# Patient Record
Sex: Female | Born: 1965 | Race: White | Hispanic: No | Smoking: Never smoker
Health system: Southern US, Community
[De-identification: ages and names within clinical notes are randomized; demographics above are authoritative.]

## PROBLEM LIST (undated history)

## (undated) DIAGNOSIS — M25471 Effusion, right ankle: Secondary | ICD-10-CM

## (undated) DIAGNOSIS — M1990 Unspecified osteoarthritis, unspecified site: Secondary | ICD-10-CM

## (undated) DIAGNOSIS — E538 Deficiency of other specified B group vitamins: Secondary | ICD-10-CM

## (undated) DIAGNOSIS — Z8489 Family history of other specified conditions: Secondary | ICD-10-CM

## (undated) DIAGNOSIS — R7303 Prediabetes: Secondary | ICD-10-CM

## (undated) DIAGNOSIS — G4730 Sleep apnea, unspecified: Secondary | ICD-10-CM

## (undated) DIAGNOSIS — M2550 Pain in unspecified joint: Secondary | ICD-10-CM

## (undated) DIAGNOSIS — E559 Vitamin D deficiency, unspecified: Secondary | ICD-10-CM

## (undated) DIAGNOSIS — E079 Disorder of thyroid, unspecified: Secondary | ICD-10-CM

## (undated) DIAGNOSIS — N200 Calculus of kidney: Secondary | ICD-10-CM

## (undated) DIAGNOSIS — M25569 Pain in unspecified knee: Secondary | ICD-10-CM

## (undated) DIAGNOSIS — M25472 Effusion, left ankle: Secondary | ICD-10-CM

## (undated) DIAGNOSIS — M25474 Effusion, right foot: Secondary | ICD-10-CM

## (undated) DIAGNOSIS — I10 Essential (primary) hypertension: Secondary | ICD-10-CM

## (undated) DIAGNOSIS — Z87442 Personal history of urinary calculi: Secondary | ICD-10-CM

## (undated) DIAGNOSIS — R5383 Other fatigue: Secondary | ICD-10-CM

## (undated) DIAGNOSIS — E039 Hypothyroidism, unspecified: Secondary | ICD-10-CM

## (undated) DIAGNOSIS — T8859XA Other complications of anesthesia, initial encounter: Secondary | ICD-10-CM

## (undated) DIAGNOSIS — M25475 Effusion, left foot: Secondary | ICD-10-CM

## (undated) HISTORY — DX: Pain in unspecified knee: M25.569

## (undated) HISTORY — DX: Vitamin D deficiency, unspecified: E55.9

## (undated) HISTORY — DX: Other fatigue: R53.83

## (undated) HISTORY — PX: KIDNEY STONE SURGERY: SHX686

## (undated) HISTORY — DX: Pain in unspecified joint: M25.50

## (undated) HISTORY — DX: Effusion, right foot: M25.474

## (undated) HISTORY — PX: SEPTOPLASTY: SUR1290

## (undated) HISTORY — DX: Deficiency of other specified B group vitamins: E53.8

## (undated) HISTORY — DX: Calculus of kidney: N20.0

## (undated) HISTORY — PX: OTHER SURGICAL HISTORY: SHX169

## (undated) HISTORY — DX: Unspecified osteoarthritis, unspecified site: M19.90

## (undated) HISTORY — PX: JOINT REPLACEMENT: SHX530

## (undated) HISTORY — DX: Disorder of thyroid, unspecified: E07.9

## (undated) HISTORY — DX: Essential (primary) hypertension: I10

## (undated) HISTORY — DX: Prediabetes: R73.03

## (undated) HISTORY — DX: Sleep apnea, unspecified: G47.30

## (undated) HISTORY — DX: Effusion, left foot: M25.475

## (undated) HISTORY — DX: Effusion, left ankle: M25.472

## (undated) HISTORY — DX: Effusion, right ankle: M25.471

---

## 2014-01-06 DIAGNOSIS — M1990 Unspecified osteoarthritis, unspecified site: Secondary | ICD-10-CM | POA: Insufficient documentation

## 2014-09-20 DIAGNOSIS — R7303 Prediabetes: Secondary | ICD-10-CM | POA: Insufficient documentation

## 2014-09-24 DIAGNOSIS — E559 Vitamin D deficiency, unspecified: Secondary | ICD-10-CM | POA: Insufficient documentation

## 2015-07-20 DIAGNOSIS — G4733 Obstructive sleep apnea (adult) (pediatric): Secondary | ICD-10-CM | POA: Insufficient documentation

## 2015-07-28 DIAGNOSIS — Q8909 Congenital malformations of spleen: Secondary | ICD-10-CM | POA: Insufficient documentation

## 2019-03-01 DIAGNOSIS — N951 Menopausal and female climacteric states: Secondary | ICD-10-CM | POA: Diagnosis not present

## 2019-03-01 DIAGNOSIS — R7989 Other specified abnormal findings of blood chemistry: Secondary | ICD-10-CM | POA: Diagnosis not present

## 2019-03-01 DIAGNOSIS — E039 Hypothyroidism, unspecified: Secondary | ICD-10-CM | POA: Diagnosis not present

## 2019-03-01 DIAGNOSIS — E559 Vitamin D deficiency, unspecified: Secondary | ICD-10-CM | POA: Diagnosis not present

## 2019-03-01 DIAGNOSIS — E538 Deficiency of other specified B group vitamins: Secondary | ICD-10-CM | POA: Diagnosis not present

## 2019-03-01 DIAGNOSIS — R5383 Other fatigue: Secondary | ICD-10-CM | POA: Diagnosis not present

## 2019-03-15 DIAGNOSIS — N951 Menopausal and female climacteric states: Secondary | ICD-10-CM | POA: Diagnosis not present

## 2019-03-15 DIAGNOSIS — E8881 Metabolic syndrome: Secondary | ICD-10-CM | POA: Diagnosis not present

## 2019-03-15 DIAGNOSIS — E669 Obesity, unspecified: Secondary | ICD-10-CM | POA: Diagnosis not present

## 2019-03-15 DIAGNOSIS — E039 Hypothyroidism, unspecified: Secondary | ICD-10-CM | POA: Diagnosis not present

## 2019-05-11 DIAGNOSIS — N200 Calculus of kidney: Secondary | ICD-10-CM | POA: Diagnosis not present

## 2019-05-28 DIAGNOSIS — F419 Anxiety disorder, unspecified: Secondary | ICD-10-CM | POA: Diagnosis not present

## 2019-06-07 DIAGNOSIS — N951 Menopausal and female climacteric states: Secondary | ICD-10-CM | POA: Diagnosis not present

## 2019-06-07 DIAGNOSIS — E039 Hypothyroidism, unspecified: Secondary | ICD-10-CM | POA: Diagnosis not present

## 2019-06-07 DIAGNOSIS — R5383 Other fatigue: Secondary | ICD-10-CM | POA: Diagnosis not present

## 2019-06-07 DIAGNOSIS — E538 Deficiency of other specified B group vitamins: Secondary | ICD-10-CM | POA: Diagnosis not present

## 2019-06-07 DIAGNOSIS — E559 Vitamin D deficiency, unspecified: Secondary | ICD-10-CM | POA: Diagnosis not present

## 2019-06-07 DIAGNOSIS — E669 Obesity, unspecified: Secondary | ICD-10-CM | POA: Diagnosis not present

## 2019-06-21 DIAGNOSIS — E669 Obesity, unspecified: Secondary | ICD-10-CM | POA: Diagnosis not present

## 2019-06-21 DIAGNOSIS — E8881 Metabolic syndrome: Secondary | ICD-10-CM | POA: Diagnosis not present

## 2019-06-21 DIAGNOSIS — N951 Menopausal and female climacteric states: Secondary | ICD-10-CM | POA: Diagnosis not present

## 2019-06-21 DIAGNOSIS — E538 Deficiency of other specified B group vitamins: Secondary | ICD-10-CM | POA: Diagnosis not present

## 2019-07-16 DIAGNOSIS — J301 Allergic rhinitis due to pollen: Secondary | ICD-10-CM | POA: Diagnosis not present

## 2019-07-16 DIAGNOSIS — R9431 Abnormal electrocardiogram [ECG] [EKG]: Secondary | ICD-10-CM | POA: Diagnosis not present

## 2019-07-16 DIAGNOSIS — Z9119 Patient's noncompliance with other medical treatment and regimen: Secondary | ICD-10-CM | POA: Diagnosis not present

## 2019-07-16 DIAGNOSIS — R918 Other nonspecific abnormal finding of lung field: Secondary | ICD-10-CM | POA: Diagnosis not present

## 2019-07-16 DIAGNOSIS — R079 Chest pain, unspecified: Secondary | ICD-10-CM | POA: Diagnosis not present

## 2019-07-16 DIAGNOSIS — M5412 Radiculopathy, cervical region: Secondary | ICD-10-CM | POA: Diagnosis not present

## 2019-07-16 DIAGNOSIS — M1990 Unspecified osteoarthritis, unspecified site: Secondary | ICD-10-CM | POA: Diagnosis not present

## 2019-07-16 DIAGNOSIS — Z79899 Other long term (current) drug therapy: Secondary | ICD-10-CM | POA: Diagnosis not present

## 2019-07-16 DIAGNOSIS — R110 Nausea: Secondary | ICD-10-CM | POA: Diagnosis not present

## 2019-07-16 DIAGNOSIS — M5031 Other cervical disc degeneration,  high cervical region: Secondary | ICD-10-CM | POA: Diagnosis not present

## 2019-07-16 DIAGNOSIS — M47812 Spondylosis without myelopathy or radiculopathy, cervical region: Secondary | ICD-10-CM | POA: Diagnosis not present

## 2019-07-16 DIAGNOSIS — G4730 Sleep apnea, unspecified: Secondary | ICD-10-CM | POA: Diagnosis not present

## 2019-07-16 DIAGNOSIS — I10 Essential (primary) hypertension: Secondary | ICD-10-CM | POA: Diagnosis not present

## 2019-07-16 DIAGNOSIS — Z79891 Long term (current) use of opiate analgesic: Secondary | ICD-10-CM | POA: Diagnosis not present

## 2019-09-30 DIAGNOSIS — E039 Hypothyroidism, unspecified: Secondary | ICD-10-CM | POA: Diagnosis not present

## 2019-09-30 DIAGNOSIS — N951 Menopausal and female climacteric states: Secondary | ICD-10-CM | POA: Diagnosis not present

## 2019-09-30 DIAGNOSIS — E559 Vitamin D deficiency, unspecified: Secondary | ICD-10-CM | POA: Diagnosis not present

## 2019-09-30 DIAGNOSIS — R5383 Other fatigue: Secondary | ICD-10-CM | POA: Diagnosis not present

## 2019-09-30 DIAGNOSIS — E669 Obesity, unspecified: Secondary | ICD-10-CM | POA: Diagnosis not present

## 2019-09-30 DIAGNOSIS — E538 Deficiency of other specified B group vitamins: Secondary | ICD-10-CM | POA: Diagnosis not present

## 2019-10-18 DIAGNOSIS — N951 Menopausal and female climacteric states: Secondary | ICD-10-CM | POA: Diagnosis not present

## 2019-10-18 DIAGNOSIS — E8881 Metabolic syndrome: Secondary | ICD-10-CM | POA: Diagnosis not present

## 2019-10-18 DIAGNOSIS — E669 Obesity, unspecified: Secondary | ICD-10-CM | POA: Diagnosis not present

## 2019-10-18 DIAGNOSIS — R5383 Other fatigue: Secondary | ICD-10-CM | POA: Diagnosis not present

## 2019-11-09 DIAGNOSIS — Z1231 Encounter for screening mammogram for malignant neoplasm of breast: Secondary | ICD-10-CM | POA: Diagnosis not present

## 2019-11-26 DIAGNOSIS — N200 Calculus of kidney: Secondary | ICD-10-CM | POA: Diagnosis not present

## 2019-12-03 DIAGNOSIS — K219 Gastro-esophageal reflux disease without esophagitis: Secondary | ICD-10-CM | POA: Diagnosis not present

## 2019-12-17 DIAGNOSIS — M25562 Pain in left knee: Secondary | ICD-10-CM | POA: Diagnosis not present

## 2019-12-17 DIAGNOSIS — M25561 Pain in right knee: Secondary | ICD-10-CM | POA: Diagnosis not present

## 2020-01-10 DIAGNOSIS — M25561 Pain in right knee: Secondary | ICD-10-CM | POA: Diagnosis not present

## 2020-01-10 DIAGNOSIS — M25562 Pain in left knee: Secondary | ICD-10-CM | POA: Diagnosis not present

## 2020-01-24 DIAGNOSIS — M25561 Pain in right knee: Secondary | ICD-10-CM | POA: Diagnosis not present

## 2020-01-24 DIAGNOSIS — M170 Bilateral primary osteoarthritis of knee: Secondary | ICD-10-CM | POA: Diagnosis not present

## 2020-01-24 DIAGNOSIS — M25562 Pain in left knee: Secondary | ICD-10-CM | POA: Diagnosis not present

## 2020-02-03 DIAGNOSIS — E538 Deficiency of other specified B group vitamins: Secondary | ICD-10-CM | POA: Diagnosis not present

## 2020-02-03 DIAGNOSIS — E039 Hypothyroidism, unspecified: Secondary | ICD-10-CM | POA: Diagnosis not present

## 2020-02-03 DIAGNOSIS — N951 Menopausal and female climacteric states: Secondary | ICD-10-CM | POA: Diagnosis not present

## 2020-02-03 DIAGNOSIS — E669 Obesity, unspecified: Secondary | ICD-10-CM | POA: Diagnosis not present

## 2020-02-03 DIAGNOSIS — R5383 Other fatigue: Secondary | ICD-10-CM | POA: Diagnosis not present

## 2020-02-03 DIAGNOSIS — E559 Vitamin D deficiency, unspecified: Secondary | ICD-10-CM | POA: Diagnosis not present

## 2020-02-08 DIAGNOSIS — Z23 Encounter for immunization: Secondary | ICD-10-CM | POA: Diagnosis not present

## 2020-02-14 DIAGNOSIS — N951 Menopausal and female climacteric states: Secondary | ICD-10-CM | POA: Diagnosis not present

## 2020-02-14 DIAGNOSIS — R5383 Other fatigue: Secondary | ICD-10-CM | POA: Diagnosis not present

## 2020-02-14 DIAGNOSIS — E8881 Metabolic syndrome: Secondary | ICD-10-CM | POA: Diagnosis not present

## 2020-02-14 DIAGNOSIS — E039 Hypothyroidism, unspecified: Secondary | ICD-10-CM | POA: Diagnosis not present

## 2020-02-21 DIAGNOSIS — M25562 Pain in left knee: Secondary | ICD-10-CM | POA: Diagnosis not present

## 2020-02-21 DIAGNOSIS — M25561 Pain in right knee: Secondary | ICD-10-CM | POA: Diagnosis not present

## 2020-02-29 DIAGNOSIS — Z23 Encounter for immunization: Secondary | ICD-10-CM | POA: Diagnosis not present

## 2020-03-24 DIAGNOSIS — M25562 Pain in left knee: Secondary | ICD-10-CM | POA: Diagnosis not present

## 2020-03-24 DIAGNOSIS — M25561 Pain in right knee: Secondary | ICD-10-CM | POA: Diagnosis not present

## 2020-07-13 DIAGNOSIS — M549 Dorsalgia, unspecified: Secondary | ICD-10-CM | POA: Diagnosis not present

## 2020-07-13 DIAGNOSIS — M47812 Spondylosis without myelopathy or radiculopathy, cervical region: Secondary | ICD-10-CM | POA: Diagnosis not present

## 2020-07-13 DIAGNOSIS — M542 Cervicalgia: Secondary | ICD-10-CM | POA: Diagnosis not present

## 2020-07-24 DIAGNOSIS — M47812 Spondylosis without myelopathy or radiculopathy, cervical region: Secondary | ICD-10-CM | POA: Diagnosis not present

## 2020-07-24 DIAGNOSIS — R6889 Other general symptoms and signs: Secondary | ICD-10-CM | POA: Insufficient documentation

## 2020-07-24 DIAGNOSIS — M542 Cervicalgia: Secondary | ICD-10-CM | POA: Diagnosis not present

## 2020-08-01 DIAGNOSIS — M542 Cervicalgia: Secondary | ICD-10-CM | POA: Diagnosis not present

## 2020-08-01 DIAGNOSIS — M47812 Spondylosis without myelopathy or radiculopathy, cervical region: Secondary | ICD-10-CM | POA: Diagnosis not present

## 2020-08-01 DIAGNOSIS — R6889 Other general symptoms and signs: Secondary | ICD-10-CM | POA: Diagnosis not present

## 2020-08-04 DIAGNOSIS — E559 Vitamin D deficiency, unspecified: Secondary | ICD-10-CM | POA: Diagnosis not present

## 2020-08-04 DIAGNOSIS — E039 Hypothyroidism, unspecified: Secondary | ICD-10-CM | POA: Diagnosis not present

## 2020-08-04 DIAGNOSIS — Z13828 Encounter for screening for other musculoskeletal disorder: Secondary | ICD-10-CM | POA: Diagnosis not present

## 2020-08-04 DIAGNOSIS — N951 Menopausal and female climacteric states: Secondary | ICD-10-CM | POA: Diagnosis not present

## 2020-08-04 DIAGNOSIS — E8881 Metabolic syndrome: Secondary | ICD-10-CM | POA: Diagnosis not present

## 2020-08-04 DIAGNOSIS — E538 Deficiency of other specified B group vitamins: Secondary | ICD-10-CM | POA: Diagnosis not present

## 2020-08-04 DIAGNOSIS — R5383 Other fatigue: Secondary | ICD-10-CM | POA: Diagnosis not present

## 2020-08-04 DIAGNOSIS — E669 Obesity, unspecified: Secondary | ICD-10-CM | POA: Diagnosis not present

## 2020-08-08 DIAGNOSIS — R6889 Other general symptoms and signs: Secondary | ICD-10-CM | POA: Diagnosis not present

## 2020-08-08 DIAGNOSIS — M542 Cervicalgia: Secondary | ICD-10-CM | POA: Diagnosis not present

## 2020-08-15 DIAGNOSIS — M542 Cervicalgia: Secondary | ICD-10-CM | POA: Diagnosis not present

## 2020-08-15 DIAGNOSIS — M47812 Spondylosis without myelopathy or radiculopathy, cervical region: Secondary | ICD-10-CM | POA: Diagnosis not present

## 2020-08-15 DIAGNOSIS — R6889 Other general symptoms and signs: Secondary | ICD-10-CM | POA: Diagnosis not present

## 2020-08-17 DIAGNOSIS — M47812 Spondylosis without myelopathy or radiculopathy, cervical region: Secondary | ICD-10-CM | POA: Diagnosis not present

## 2020-08-17 DIAGNOSIS — M549 Dorsalgia, unspecified: Secondary | ICD-10-CM | POA: Diagnosis not present

## 2020-08-23 DIAGNOSIS — E669 Obesity, unspecified: Secondary | ICD-10-CM | POA: Diagnosis not present

## 2020-08-23 DIAGNOSIS — R5383 Other fatigue: Secondary | ICD-10-CM | POA: Diagnosis not present

## 2020-08-23 DIAGNOSIS — E039 Hypothyroidism, unspecified: Secondary | ICD-10-CM | POA: Diagnosis not present

## 2020-08-23 DIAGNOSIS — S83209A Unspecified tear of unspecified meniscus, current injury, unspecified knee, initial encounter: Secondary | ICD-10-CM | POA: Diagnosis not present

## 2020-09-05 DIAGNOSIS — R6889 Other general symptoms and signs: Secondary | ICD-10-CM | POA: Diagnosis not present

## 2020-09-05 DIAGNOSIS — M542 Cervicalgia: Secondary | ICD-10-CM | POA: Diagnosis not present

## 2020-09-05 DIAGNOSIS — M47812 Spondylosis without myelopathy or radiculopathy, cervical region: Secondary | ICD-10-CM | POA: Diagnosis not present

## 2020-09-07 DIAGNOSIS — Z23 Encounter for immunization: Secondary | ICD-10-CM | POA: Diagnosis not present

## 2020-10-01 ENCOUNTER — Other Ambulatory Visit: Payer: Self-pay

## 2020-10-01 ENCOUNTER — Emergency Department (INDEPENDENT_AMBULATORY_CARE_PROVIDER_SITE_OTHER)
Admission: EM | Admit: 2020-10-01 | Discharge: 2020-10-01 | Disposition: A | Discharge status: Home / Self Care | Payer: BC Managed Care – PPO | Source: Home / Self Care

## 2020-10-01 ENCOUNTER — Emergency Department (INDEPENDENT_AMBULATORY_CARE_PROVIDER_SITE_OTHER): Payer: BC Managed Care – PPO

## 2020-10-01 DIAGNOSIS — R059 Cough, unspecified: Secondary | ICD-10-CM | POA: Diagnosis not present

## 2020-10-01 DIAGNOSIS — R911 Solitary pulmonary nodule: Secondary | ICD-10-CM

## 2020-10-01 DIAGNOSIS — J208 Acute bronchitis due to other specified organisms: Secondary | ICD-10-CM

## 2020-10-01 MED ORDER — AMOXICILLIN 875 MG PO TABS: 875.0000 mg | ORAL_TABLET | Freq: Two times a day (BID) | ORAL | 0 refills | Status: DC

## 2020-10-01 MED ORDER — BENZONATATE 100 MG PO CAPS
100.0000 mg | ORAL_CAPSULE | Freq: Three times a day (TID) | ORAL | 0 refills | Status: DC | PRN
Start: 2020-10-01 — End: 2020-12-27

## 2020-10-01 MED ORDER — PREDNISONE 20 MG PO TABS: 40.0000 mg | ORAL_TABLET | Freq: Every day | ORAL | 0 refills | Status: DC

## 2020-10-01 MED ORDER — HYDROCOD POLST-CPM POLST ER 10-8 MG/5ML PO SUER: 5.0000 mL | Freq: Two times a day (BID) | ORAL | 0 refills | Status: DC | PRN

## 2020-10-01 NOTE — ED Triage Notes (Signed)
Patient presents to Urgent Care with complaints of congestion, productive cough, and shortness of breath since yesterday. Patient reports she gets bronchitis frequently. Pt has been vaccinated for covid and flu, has not been tested for covid recently.

## 2020-10-01 NOTE — ED Provider Notes (Signed)
[name]    CSN: [id number] Arrival date & time: 10/01/20  1131      History   Chief Complaint Chief Complaint  Patient presents with  . Shortness of Breath  . Nasal Congestion    HPI Laura Knox is a 54 y.o. female.   HPI Patient presents today with acute onset SOB with cough and nasal congestion. Reports history of recurrent pneumonia, bronchitis, and rhinitis type symptoms. She is not under the care of lung specialist. She is a nonsmoker. Attributes symptoms to weather changes  History reviewed. No pertinent past medical history.  There are no problems to display for this patient.   History reviewed. No pertinent surgical history.  OB History   No obstetric history on file.      Home Medications    Prior to Admission medications   Medication Sig Start Date End Date Taking? Authorizing Provider  progesterone (ENDOMETRIN) 100 MG vaginal insert Place 100 mg vaginally 2 (two) times daily.   Yes [provider]  thyroid (ARMOUR) 15 MG tablet Take 15 mg by mouth daily.   Yes [provider]    Family History Family History  Problem Relation Age of Onset  . Healthy Mother   . Cancer Father     Social History Social History   Tobacco Use  . Smoking status: Never Smoker  . Smokeless tobacco: Never Used  Vaping Use  . Vaping Use: Never used  Substance Use Topics  . Alcohol use: Not Currently  . Drug use: Never     Allergies   Patient has no known allergies.   Review of Systems Review of Systems Pertinent negatives listed in HPI    Physical Exam Triage Vital Signs ED Triage Vitals  Enc Vitals Group     BP 10/01/20 1203 (!) 179/128     Pulse Rate 10/01/20 1203 84     Resp 10/01/20 1300 16     Temp 10/01/20 1203 97.8 F (36.6 C)     Temp Source 10/01/20 1203 Oral     SpO2 10/01/20 1203 96 %     Weight --      Height --      Head Circumference --      Peak Flow --      Pain Score 10/01/20 1200 0     Pain  Loc --      Pain Edu? --      Excl. in GC? --    No data found.  Updated Vital Signs BP (S) (!) 179/128 (BP Location: Right Wrist)   Pulse 84   Temp 97.8 F (36.6 C) (Oral)   Resp 16   SpO2 96%   Visual Acuity Right Eye Distance:   Left Eye Distance:   Bilateral Distance:    Right Eye Near:   Left Eye Near:    Bilateral Near:     Physical Exam General appearance: alert, Ill-appearing, obese, no distress Head: Normocephalic, without obvious abnormality, atraumatic ENT: mucosal edema, congestion, erythematous oropharynx w/o exudate Respiratory: Respirations even , unlabored, coarse lung sound, expiratory wheeze Heart: rate and rhythm normal. No gallop or murmurs noted on exam  Abdomen: BS +, no distention, no rebound tenderness, or no mass Extremities: No gross deformities Skin: Skin color, texture, turgor normal. No rashes seen  Psych: Appropriate mood and affect. Neurologic:Alert, oriented to person, place, and time, thought content appropriate. UC Treatments / Results  Labs (all labs ordered are listed, but only abnormal results are displayed)  Labs Reviewed - No data to display  EKG   Radiology DG Chest 2 View  Result Date: 10/01/2020 CLINICAL DATA:  Cough. EXAM: CHEST - 2 VIEW COMPARISON:  None. FINDINGS: The cardio pericardial silhouette is enlarged. Interstitial markings are diffusely coarsened with chronic features. Small pulmonary nodule identified left upper lobe. The visualized bony structures of the thorax show no acute abnormality. IMPRESSION: Left upper lobe pulmonary nodule. CT chest without contrast recommended to further evaluate. Electronically Signed   By: Kennith Center M.D.   On: 10/01/2020 13:20    Procedures Procedures (including critical care time)  Medications Ordered in UC Medications - No data to display  Initial Impression / Assessment and Plan / UC Course  I have reviewed the triage vital signs and the nursing notes.  Pertinent labs &  imaging results that were available during my care of the patient were reviewed by me and considered in my medical decision making (see chart for details).    Acute bronchitis, imaging revealed incidental fining of a left upper lung nodule . Treatment per discharge medications Amoxicillin ordered as a future fill if patient symptoms do not improve within 5-7 days. Counseled patent on the indication of antibiotics and that treatment with antbx are not useful in viral or chronic respiratory illness. Referral placed [facility] pulmonology for work-up of lung nodule. Final Clinical Impressions(s) / UC Diagnoses   Final diagnoses:  Nodule of upper lobe of left lung  Acute bronchitis due to other specified organisms   Discharge Instructions   None    ED Prescriptions    Medication Sig Dispense Auth. Provider   amoxicillin (AMOXIL) 875 MG tablet Take 1 tablet (875 mg total) by mouth 2 (two) times daily. 20 tablet Bing Neighbors, FNP   chlorpheniramine-HYDROcodone [facility] ER) 10-8 MG/5ML SUER Take 5 mLs by mouth every 12 (twelve) hours as needed for cough. 100 mL Bing Neighbors, FNP   predniSONE (DELTASONE) 20 MG tablet Take 2 tablets (40 mg total) by mouth daily with breakfast. 10 tablet Bing Neighbors, FNP   benzonatate (TESSALON) 100 MG capsule Take 1-2 capsules (100-200 mg total) by mouth 3 (three) times daily as needed for cough. 40 capsule Bing Neighbors, FNP     PDMP not reviewed this encounter.   Bing Neighbors, FNP 10/05/20 2219

## 2020-10-04 ENCOUNTER — Telehealth: Payer: Self-pay | Admitting: Emergency Medicine

## 2020-10-06 ENCOUNTER — Emergency Department (INDEPENDENT_AMBULATORY_CARE_PROVIDER_SITE_OTHER)
Admission: EM | Admit: 2020-10-06 | Discharge: 2020-10-06 | Disposition: A | Discharge status: Home / Self Care | Payer: BC Managed Care – PPO | Source: Home / Self Care

## 2020-10-06 DIAGNOSIS — R030 Elevated blood-pressure reading, without diagnosis of hypertension: Secondary | ICD-10-CM | POA: Diagnosis not present

## 2020-10-06 MED ORDER — AMOXICILLIN 875 MG PO TABS: 875.0000 mg | ORAL_TABLET | Freq: Two times a day (BID) | ORAL | 0 refills | Status: DC

## 2020-10-06 NOTE — ED Triage Notes (Signed)
Patient presents to Urgent Care with complaints of continued shortness of breath and facial pressure. Patient reports she was seen over the weekend and has returned because her symptoms have not improved, states she has not slept in days, completed her prescribed course of prednisone (has order for amoxicillin to begin tomorrow). Pt denies fevers, NAD during triage, speaking in full sentences.

## 2020-10-06 NOTE — ED Provider Notes (Signed)
[name]    CSN: [id number] Arrival date & time: 10/06/20  0809      History   Chief Complaint Chief Complaint  Patient presents with  . Shortness of Breath    HPI Laura Knox is a 54 y.o. female.   HPI Here for follow up Prior note reviewed Is not due to pick up her amoxicillin until tomorrow but wants I today Feels " no better"   Still with constant cough No fever or chills Producing yellow sputum  History reviewed. No pertinent past medical history.  There are no problems to display for this patient.   History reviewed. No pertinent surgical history.  OB History   No obstetric history on file.      Home Medications    Prior to Admission medications   Medication Sig Start Date End Date Taking? Authorizing Provider  amoxicillin (AMOXIL) 875 MG tablet Take 1 tablet (875 mg total) by mouth 2 (two) times daily. 10/07/20   Eustace Moore, MD  benzonatate (TESSALON) 100 MG capsule Take 1-2 capsules (100-200 mg total) by mouth 3 (three) times daily as needed for cough. 10/01/20   Bing Neighbors, FNP  chlorpheniramine-HYDROcodone (TUSSIONEX PENNKINETIC ER) 10-8 MG/5ML SUER Take 5 mLs by mouth every 12 (twelve) hours as needed for cough. 10/01/20   Bing Neighbors, FNP  predniSONE (DELTASONE) 20 MG tablet Take 2 tablets (40 mg total) by mouth daily with breakfast. 10/01/20   Bing Neighbors, FNP  progesterone (ENDOMETRIN) 100 MG vaginal insert Place 100 mg vaginally 2 (two) times daily.    [provider]  thyroid (ARMOUR) 15 MG tablet Take 15 mg by mouth daily.    [provider]    Family History Family History  Problem Relation Age of Onset  . Healthy Mother   . Cancer Father     Social History Social History   Tobacco Use  . Smoking status: Never Smoker  . Smokeless tobacco: Never Used  Vaping Use  . Vaping Use: Never used  Substance Use Topics  . Alcohol use: Not Currently  . Drug use: Never      Allergies   Patient has no known allergies.   Review of Systems Review of Systems See HPI  Physical Exam Triage Vital Signs ED Triage Vitals  Enc Vitals Group     BP 10/06/20 0826 (!) 185/113     Pulse Rate 10/06/20 0826 82     Resp 10/06/20 0826 16     Temp 10/06/20 0826 (!) 97.5 F (36.4 C)     Temp Source 10/06/20 0826 Oral     SpO2 10/06/20 0826 95 %     Weight --      Height --      Head Circumference --      Peak Flow --      Pain Score 10/06/20 0825 5     Pain Loc --      Pain Edu? --      Excl. in GC? --    No data found.  Updated Vital Signs BP (S) (!) 185/113 (BP Location: Right Wrist)   Pulse 82   Temp (!) 97.5 F (36.4 C) (Oral)   Resp 16   SpO2 95%     Physical Exam Constitutional:      General: She is not in acute distress.    Appearance: She is well-developed and well-nourished. She is obese. She is ill-appearing.  HENT:     Head: Normocephalic  and atraumatic.     Mouth/Throat:     Mouth: Oropharynx is clear and moist.  Eyes:     Conjunctiva/sclera: Conjunctivae normal.     Pupils: Pupils are equal, round, and reactive to light.  Cardiovascular:     Rate and Rhythm: Normal rate.     Heart sounds: Normal heart sounds.  Pulmonary:     Effort: Pulmonary effort is normal. No respiratory distress.     Breath sounds: Normal breath sounds.     Comments: Few anterior rhonchi Abdominal:     General: There is no distension.     Palpations: Abdomen is soft.  Musculoskeletal:        General: No edema. Normal range of motion.     Cervical back: Normal range of motion.  Skin:    General: Skin is warm and dry.  Neurological:     Mental Status: She is alert.  Psychiatric:        Behavior: Behavior normal.      UC Treatments / Results  Labs (all labs ordered are listed, but only abnormal results are displayed) Labs Reviewed - No data to display  EKG   Radiology No results found.  Procedures Procedures (including critical care  time)  Medications Ordered in UC Medications - No data to display  Initial Impression / Assessment and Plan / UC Course  I have reviewed the triage vital signs and the nursing notes.  Pertinent labs & imaging results that were available during my care of the patient were reviewed by me and considered in my medical decision making (see chart for details).     Reviewed elevated BP Push fluids Return as needed   Final Clinical Impressions(s) / UC Diagnoses   Final diagnoses:  Elevated blood pressure reading     Discharge Instructions     Continue to drink plenty of water Consider a humidifier Take the antibiotic 2 x a day Follow up on your blood pressure/ Need a primary care doctor   ED Prescriptions    Medication Sig Dispense Auth. Provider   amoxicillin (AMOXIL) 875 MG tablet Take 1 tablet (875 mg total) by mouth 2 (two) times daily. 20 tablet Eustace Moore, MD     PDMP not reviewed this encounter.   Eustace Moore, MD 10/06/20 1027

## 2020-10-06 NOTE — Discharge Instructions (Signed)
Continue to drink plenty of water Consider a humidifier Take the antibiotic 2 x a day Follow up on your blood pressure/ Need a primary care doctor

## 2020-12-01 DIAGNOSIS — H6983 Other specified disorders of Eustachian tube, bilateral: Secondary | ICD-10-CM | POA: Diagnosis not present

## 2020-12-01 DIAGNOSIS — H93293 Other abnormal auditory perceptions, bilateral: Secondary | ICD-10-CM | POA: Diagnosis not present

## 2020-12-05 DIAGNOSIS — N200 Calculus of kidney: Secondary | ICD-10-CM | POA: Diagnosis not present

## 2020-12-20 ENCOUNTER — Other Ambulatory Visit: Payer: Self-pay

## 2020-12-20 ENCOUNTER — Encounter: Payer: Self-pay | Admitting: Gastroenterology

## 2020-12-20 ENCOUNTER — Encounter: Payer: Self-pay | Admitting: Medical

## 2020-12-20 ENCOUNTER — Ambulatory Visit (INDEPENDENT_AMBULATORY_CARE_PROVIDER_SITE_OTHER): Payer: BC Managed Care – PPO | Admitting: Medical

## 2020-12-20 VITALS — BP 157/78 | HR 76 | Temp 98.3°F | Resp 20 | Ht 67.0 in | Wt 382.0 lb

## 2020-12-20 DIAGNOSIS — E039 Hypothyroidism, unspecified: Secondary | ICD-10-CM | POA: Diagnosis not present

## 2020-12-20 DIAGNOSIS — Z1159 Encounter for screening for other viral diseases: Secondary | ICD-10-CM | POA: Diagnosis not present

## 2020-12-20 DIAGNOSIS — Z113 Encounter for screening for infections with a predominantly sexual mode of transmission: Secondary | ICD-10-CM

## 2020-12-20 DIAGNOSIS — G4730 Sleep apnea, unspecified: Secondary | ICD-10-CM | POA: Diagnosis not present

## 2020-12-20 DIAGNOSIS — I10 Essential (primary) hypertension: Secondary | ICD-10-CM | POA: Diagnosis not present

## 2020-12-20 DIAGNOSIS — Z1211 Encounter for screening for malignant neoplasm of colon: Secondary | ICD-10-CM | POA: Diagnosis not present

## 2020-12-20 DIAGNOSIS — Z124 Encounter for screening for malignant neoplasm of cervix: Secondary | ICD-10-CM

## 2020-12-20 DIAGNOSIS — Z6841 Body Mass Index (BMI) 40.0 and over, adult: Secondary | ICD-10-CM

## 2020-12-20 LAB — COMPREHENSIVE METABOLIC PANEL
ALT: 16 U/L (ref 0–35)
AST: 12 U/L (ref 0–37)
Albumin: 3.5 g/dL (ref 3.5–5.2)
Alkaline Phosphatase: 100 U/L (ref 39–117)
BUN: 21 mg/dL (ref 6–23)
CO2: 32 mEq/L (ref 19–32)
Calcium: 8.9 mg/dL (ref 8.4–10.5)
Chloride: 104 mEq/L (ref 96–112)
Creatinine, Ser: 0.75 mg/dL (ref 0.40–1.20)
GFR: 89.43 mL/min (ref >60.00)
Glucose, Bld: 95 mg/dL (ref 70–99)
Potassium: 4.3 mEq/L (ref 3.5–5.1)
Sodium: 141 mEq/L (ref 135–145)
Total Bilirubin: 0.4 mg/dL (ref 0.2–1.2)
Total Protein: 6.4 g/dL (ref 6.0–8.3)

## 2020-12-20 LAB — T4, FREE: Free T4: 0.82 ng/dL (ref 0.60–1.60)

## 2020-12-20 LAB — TSH: TSH: 0.75 u[IU]/mL (ref 0.35–4.50)

## 2020-12-20 MED ORDER — LOSARTAN POTASSIUM 50 MG PO TABS: 50.0000 mg | ORAL_TABLET | Freq: Every day | ORAL | 0 refills | Status: DC

## 2020-12-20 NOTE — Progress Notes (Signed)
Subjective:    Patient ID: Laura Knox, female    DOB: August 05, 1966, 55 y.o.   MRN: [id number]  HPI  Pt in for first time.  Pt moved from [place] in 2004.   Pt was seeing [place]. Pt does exericise 6 days a week light aerobics and swims twice a week. No alcohol use. Non smoker.  Pt has been struggling to lose weight. Pt states lost 45 lb with effort over 6 months then plateued.   Pt states recent high blood pressures. Pt states in December when congested and not sleeping her bp was elevated(bp was 185/113 in ED). Pt has not been checking her bp at home. No history of high cholesterol.   Pt states she has sleep apnea. Dx 10 years ago. Not on cpap.  Hx of low thyroid by chart review. Pt was on armour thyroid under [facility]. She is not seeing them anymore.  Pt states last spring had 6 months of reflux symptoms. She took omeprazole and retired. Symptoms resolved.  Pt knows she needs screening colonoscopy. Pt states up to date on mammogram. Last one was normal.   Pt is overdue for pap. Placed referral today.  Pt decline referral to pulmonologist.    Review of Systems  Constitutional: Negative for chills, fatigue and fever.  HENT: Negative for congestion, drooling, nosebleeds and postnasal drip.   Respiratory: Negative for cough, chest tightness, shortness of breath and wheezing.   Cardiovascular: Negative for chest pain and palpitations.  Gastrointestinal: Negative for abdominal pain.  Genitourinary: Negative for dysuria, enuresis and flank pain.  Musculoskeletal: Negative for back pain and myalgias.  Skin: Negative for rash and wound.  Neurological: Negative for dizziness, speech difficulty, weakness, numbness and headaches.  Hematological: Negative for adenopathy. Does not bruise/bleed easily.  Psychiatric/Behavioral: Negative for behavioral problems and confusion.    No past medical history on file.   Social History   Socioeconomic History  . Marital  status: Married    Spouse name: Not on file  . Number of children: Not on file  . Years of education: Not on file  . Highest education level: Not on file  Occupational History  . Not on file  Tobacco Use  . Smoking status: Never Smoker  . Smokeless tobacco: Never Used  Vaping Use  . Vaping Use: Never used  Substance and Sexual Activity  . Alcohol use: Not Currently  . Drug use: Never  . Sexual activity: Not on file  Other Topics Concern  . Not on file  Social History Narrative  . Not on file   Social Determinants of Health   Financial Resource Strain: Not on file  Food Insecurity: Not on file  Transportation Needs: Not on file  Physical Activity: Not on file  Stress: Not on file  Social Connections: Not on file  Intimate Partner Violence: Not on file    No past surgical history on file.  Family History  Problem Relation Age of Onset  . Healthy Mother   . Cancer Father     No Known Allergies  Current Outpatient Medications on File Prior to Visit  Medication Sig Dispense Refill  . estradiol (VIVELLE-DOT) 0.05 MG/24HR patch     . omeprazole (PRILOSEC) 20 MG capsule Take by mouth.    . progesterone (PROMETRIUM) 100 MG capsule Take by mouth.    . pyridOXINE (VITAMIN B-6) 25 MG tablet Take by mouth.    . sucralfate (CARAFATE) 1 g tablet Take by mouth.    Marland Kitchen  amoxicillin (AMOXIL) 875 MG tablet Take 1 tablet (875 mg total) by mouth 2 (two) times daily. (Patient not taking: Reported on 12/20/2020) 20 tablet 0  . benzonatate (TESSALON) 100 MG capsule Take 1-2 capsules (100-200 mg total) by mouth 3 (three) times daily as needed for cough. (Patient not taking: Reported on 12/20/2020) 40 capsule 0  . cetirizine (ZYRTEC) 5 MG tablet Take by mouth.    . chlorpheniramine-HYDROcodone (TUSSIONEX PENNKINETIC ER) 10-8 MG/5ML SUER Take 5 mLs by mouth every 12 (twelve) hours as needed for cough. (Patient not taking: Reported on 12/20/2020) 100 mL 0  . Naproxen Sodium 220 MG CAPS Take 1  tablet by mouth 2 (two) times daily.    . predniSONE (DELTASONE) 20 MG tablet Take 2 tablets (40 mg total) by mouth daily with breakfast. (Patient not taking: Reported on 12/20/2020) 10 tablet 0  . progesterone (ENDOMETRIN) 100 MG vaginal insert Place 100 mg vaginally 2 (two) times daily. (Patient not taking: Reported on 12/20/2020)    . thyroid (ARMOUR) 15 MG tablet Take 15 mg by mouth daily. (Patient not taking: Reported on 12/20/2020)    . VITAMIN D, CHOLECALCIFEROL, PO Take by mouth.     No current facility-administered medications on file prior to visit.    BP (!) 157/78   Pulse 76   Temp 98.3 F (36.8 C) (Oral)   Resp 20   Ht 5\' 7"  (1.702 m)   Wt (!) 382 lb (173.3 kg)   SpO2 96%   BMI 59.83 kg/m       Objective:   Physical Exam  General Mental Status- Alert. General Appearance- Not in acute distress.   Skin General: Color- Normal Color. Moisture- Normal Moisture.  Neck Carotid Arteries- Normal color. Moisture- Normal Moisture. No carotid bruits. No JVD.  Chest and Lung Exam Auscultation: Breath Sounds:-Normal.  Cardiovascular Auscultation:Rythm- Regular. Murmurs & Other Heart Sounds:Auscultation of the heart reveals- No Murmurs.  Abdomen Inspection:-Inspeection Normal. Palpation/Percussion:Note:No mass. Palpation and Percussion of the abdomen reveal- Non Tender, Non Distended + BS, no rebound or guarding.    Neurologic Cranial Nerve exam:- CN III-XII intact(No nystagmus), symmetric smile. Drift Test:- No drift. Romberg Exam:- Negative.  Heal to Toe Gait exam:-Normal. Finger to Nose:- Normal/Intact Strength:- 5/5 equal and symmetric strength both upper and lower extremities.      Assessment & Plan:  Nice to meet you today.  You do have history of recent hypertension moderate to severe since December.  I do think best to go ahead and start you on losartan 50 mg dose.  Today's blood pressure more reasonable and on ED visit quite worrisome.  I want you to get  electronic blood pressure cuff over-the-counter and start checking blood pressures daily until follow-up visit.  Get metabolic panel today.  History of hypothyroidism.  Will get TSH and T4 today.  Continue Armour Thyroid.  We will see if the need dose change after labs.  History of obesity and sleep apnea.  You declined referral to specialist for CPAP.  I do think is a good idea to go ahead and refer you for weight loss management.  With weight loss your sleep apnea might improve.  Referral placed for screening colonoscopy and Pap smear.  Hep C antibody and HIV blood work today for screening purposes.  Follow-up in 2 to 3 weeks or as needed.  January, PA-C

## 2020-12-20 NOTE — Patient Instructions (Addendum)
Nice to meet you today.  You do have history of recent hypertension moderate to severe since December.  I do think best to go ahead and start you on losartan 50 mg dose.  Today's blood pressure more reasonable and on ED visit quite worrisome.  I want you to get electronic blood pressure cuff over-the-counter and start checking blood pressures daily until follow-up visit.  Get metabolic panel today.  History of hypothyroidism.  Will get TSH and T4 today.  Continue Armour Thyroid.  We will see if the need dose change after labs.  History of obesity and sleep apnea.  You declined referral to specialist for CPAP.  I do think is a good idea to go ahead and refer you for weight loss management.  With weight loss your sleep apnea might improve.  Referral placed for screening colonoscopy and Pap smear.  Hep C antibody and HIV blood work today for screening purposes.  Follow-up in 2 to 3 weeks or as needed.  Pt later called stating she expected pulmonologist referral. So will place.

## 2020-12-21 LAB — HEPATITIS C ANTIBODY
Hepatitis C Ab: NONREACTIVE
SIGNAL TO CUT-OFF: 0 (ref <1.00)

## 2020-12-21 LAB — HIV ANTIBODY (ROUTINE TESTING W REFLEX): HIV 1&2 Ab, 4th Generation: NONREACTIVE

## 2020-12-22 ENCOUNTER — Telehealth: Payer: Self-pay | Admitting: Medical

## 2020-12-22 NOTE — Telephone Encounter (Signed)
Patient states on her visit this week she was suppose to have a referral to  Pulmonary . No referral is listed. Please place referral so we can authorize. Thanks

## 2020-12-22 NOTE — Addendum Note (Signed)
Addended by: Gwenevere Abbot on: 12/22/2020 11:22 AM   Modules accepted: Orders

## 2020-12-22 NOTE — Telephone Encounter (Signed)
Pt declined referral during visit. But did recommend. I placed referral just now.ca

## 2020-12-25 ENCOUNTER — Telehealth: Payer: Self-pay

## 2020-12-25 ENCOUNTER — Telehealth: Payer: Self-pay | Admitting: Medical

## 2020-12-25 NOTE — Telephone Encounter (Signed)
Called pt and lvm to return call.  

## 2020-12-25 NOTE — Telephone Encounter (Signed)
Patient states Losartan is on backorder and needs another script and doesn't know when it will be available.   Pharmacy : [facility] Correct pharmacy in chart

## 2020-12-25 NOTE — Telephone Encounter (Signed)
I do want to prescribe her losartan rather than switching to other class of med or other ARB. Is she close to other pharmacy. Or is she willing to get losartan at [facility] but other [facility] have in stock when I ask. Seems for whatever reason losartan not available at [facility]?

## 2020-12-25 NOTE — Telephone Encounter (Signed)
Opened to review 

## 2020-12-26 MED ORDER — LOSARTAN POTASSIUM 50 MG PO TABS: 50.0000 mg | ORAL_TABLET | Freq: Every day | ORAL | 0 refills | Status: DC

## 2020-12-26 NOTE — Telephone Encounter (Signed)
[facility] in [place]   [address], [place], [place] [address]

## 2020-12-26 NOTE — Addendum Note (Signed)
Addended by: Gwenevere Abbot on: 12/26/2020 05:21 PM   Modules accepted: Orders

## 2020-12-26 NOTE — Telephone Encounter (Signed)
Rx losartan sent to [facility] [place].

## 2020-12-26 NOTE — Telephone Encounter (Signed)
Called pt and lvm to return call.  

## 2020-12-27 ENCOUNTER — Encounter: Payer: Self-pay | Admitting: Medical

## 2020-12-27 ENCOUNTER — Other Ambulatory Visit: Payer: Self-pay

## 2020-12-27 ENCOUNTER — Encounter (INDEPENDENT_AMBULATORY_CARE_PROVIDER_SITE_OTHER): Payer: Self-pay | Admitting: Family Medicine

## 2020-12-27 ENCOUNTER — Ambulatory Visit (INDEPENDENT_AMBULATORY_CARE_PROVIDER_SITE_OTHER): Payer: BC Managed Care – PPO | Admitting: Family Medicine

## 2020-12-27 VITALS — BP 144/87 | HR 78 | Temp 98.4°F | Ht 66.0 in | Wt 378.0 lb

## 2020-12-27 DIAGNOSIS — G4730 Sleep apnea, unspecified: Secondary | ICD-10-CM | POA: Diagnosis not present

## 2020-12-27 DIAGNOSIS — E538 Deficiency of other specified B group vitamins: Secondary | ICD-10-CM | POA: Diagnosis not present

## 2020-12-27 DIAGNOSIS — E559 Vitamin D deficiency, unspecified: Secondary | ICD-10-CM | POA: Diagnosis not present

## 2020-12-27 DIAGNOSIS — Z9189 Other specified personal risk factors, not elsewhere classified: Secondary | ICD-10-CM

## 2020-12-27 DIAGNOSIS — R5383 Other fatigue: Secondary | ICD-10-CM

## 2020-12-27 DIAGNOSIS — Z1331 Encounter for screening for depression: Secondary | ICD-10-CM | POA: Diagnosis not present

## 2020-12-27 DIAGNOSIS — I10 Essential (primary) hypertension: Secondary | ICD-10-CM

## 2020-12-27 DIAGNOSIS — Z0289 Encounter for other administrative examinations: Secondary | ICD-10-CM

## 2020-12-27 DIAGNOSIS — R0602 Shortness of breath: Secondary | ICD-10-CM

## 2020-12-27 DIAGNOSIS — Z6841 Body Mass Index (BMI) 40.0 and over, adult: Secondary | ICD-10-CM

## 2020-12-27 NOTE — Progress Notes (Signed)
Office: [phone]  /  Fax: [phone]    Date: January 01, 2021   Appointment Start Time: 3:14pm Duration: 43 minutes Provider: Lawerance Cruel, Psy.D. Type of Session: Intake for Individual Therapy  Location of Patient: Home Location of Provider: Provider's Home (private office) Type of Contact: Telepsychological Visit via MyChart Video Visit & Telephone call   Informed Consent: This provider called Brexley at 3:02pm as she did not present for the telepsychological appointment. Assistance on connecting was provided. This provider called Elycia a few additional times to assist with connection. Due to ongoing issues with MyChart, today's appointment proceeded via telephone with Shaquia's verbal consent. As such, today's appointment was initiated 14 minutes late. Prior to proceeding with today's appointment, two pieces of identifying information were obtained. In addition, Ranisha's physical location at the time of this appointment was obtained as well a phone number she could be reached at in the event of technical difficulties. Amiyah and this provider participated in today's telepsychological service.   The provider's role was explained to [employer] The provider reviewed and discussed issues of confidentiality, privacy, and limits therein (e.g., reporting obligations). In addition to verbal informed consent, written informed consent for psychological services was obtained prior to the initial appointment. Since the clinic is not a 24/7 crisis center, mental health emergency resources were shared and this  provider explained MyChart, e-mail, voicemail, and/or other messaging systems should be utilized only for non-emergency reasons. This provider also explained that information obtained during appointments will be placed in Shaquna's medical record and relevant information will be shared with other providers at [facility] for coordination of care. Emelda agreed information may be shared with  other [facility] providers as needed for coordination of care and by signing the service agreement document, she provided written consent for coordination of care. Prior to initiating telepsychological services, Tiearra completed an informed consent document, which included the development of a safety plan (i.e., an emergency contact and emergency resources) in the event of an emergency/crisis. Marisel expressed understanding of the rationale of the safety plan. Barney verbally acknowledged understanding she is ultimately responsible for understanding her insurance benefits for telepsychological and in-person services. This provider also reviewed confidentiality, as it relates to telepsychological services, as well as the rationale for telepsychological services (i.e., to reduce exposure risk to COVID-19). Constancia  acknowledged understanding that appointments cannot be recorded without both party consent and she is aware she is responsible for securing confidentiality on her end of the session. Takako verbally consented to proceed.  Chief Complaint/HPI: Camrie was referred by Dr. Quillian Quince on December 27, 2020 (her initial appointment with the clinic). The note for the initial appointment with Dr. Quillian Quince indicated the following: "Urijah's habits were reviewed today and are as follows: Her family eats meals together, she thinks her family will eat healthier with her, her desired weight loss is 178 lbs, she has been heavy most of her life, she started gaining weight in college, her heaviest weight ever was >400 pounds, she has significant food cravings issues, she is frequently drinking liquids with calories, she frequently makes poor food choices, she has problems with excessive hunger, she frequently eats larger portions than normal and she struggles with emotional eating." Bernestine's Food and Mood (modified PHQ-9) score on December 27, 2020 was 20.  During today's appointment, Jo shared she feels the  prescribed meal plan is "limiting;" however, she is "on plan more than not." She indicated she is "struggling with  cravings." She was verbally administered a questionnaire assessing various behaviors related to emotional eating. Charnae endorsed the following: overeat when you are celebrating, experience food cravings on a regular basis, find food is comforting to you, overeat when you are alone, but eat much less when you are with other people and eat as a reward. She described the current frequency of emotional eating as daily. In addition, Treazure denied a history of binge eating. Demisha denied a history of restricting food intake, purging and engagement in other compensatory strategies for weight loss, and has never been diagnosed with an eating disorder. She also denied a history of treatment for emotional eating. Moreover, Vicke believes her eating habits are secondary to her upbringing, adding she enjoys cooking. She also discussed experiencing pain in her knees, but shared she is working out 5-6 days a week.   Mental Status Examination:  Appearance: unable to assess  Behavior: unable to assess Mood: euthymic Affect: unable to fully assess Speech: normal in rate, volume, and tone Eye Contact: unable to assess Psychomotor Activity: unable to assess  Gait: unable to assess Thought Process: linear, logical, and goal directed  Thought Content/Perception: denies suicidal and homicidal ideation, plan, and intent and no hallucinations, delusions, bizarre thinking or behavior reported or observed Orientation: time, person, place, and purpose of appointment Memory/Concentration: memory, attention, language, and fund of knowledge intact  Insight/Judgment: good  Family & Psychosocial History: Shya reported she is married and she does not have any children. She indicated she is currently retired. Additionally, Shamaria shared her highest level of education obtained is a bachelor's degree. Currently, Ambry's  social support system consists of her husband, mother, and friends. Moreover, Bonita stated she resides with her husband.   Medical History:  Past Medical History:  Diagnosis Date  . B12 deficiency   . Bilateral swelling of feet and ankles   . Hypertension   . Joint pain   . Kidney stones   . Knee pain   . Osteoarthritis   . Other fatigue   . Prediabetes   . Sleep apnea   . Thyroid disease   . Vitamin D deficiency    Past Surgical History:  Procedure Laterality Date  . KIDNEY STONE SURGERY    . miniscus repair Left   . SEPTOPLASTY     Current Outpatient Medications on File Prior to Visit  Medication Sig Dispense Refill  . Cyanocobalamin (B-12) 1000 MCG TABS Take 1 tablet by mouth daily.    Marland Kitchen estradiol (VIVELLE-DOT) 0.05 MG/24HR patch Place 1 patch onto the skin 2 (two) times a week.    . losartan (COZAAR) 50 MG tablet Take 1 tablet (50 mg total) by mouth daily. (Patient not taking: Reported on 12/27/2020) 30 tablet 0  . Naproxen Sodium 220 MG CAPS Take 3 tablets by mouth 2 (two) times daily.    . progesterone (PROMETRIUM) 100 MG capsule Take by mouth.    . pyridOXINE (VITAMIN B-6) 25 MG tablet Take by mouth.    . thyroid (ARMOUR) 120 MG tablet Take 120 mg by mouth daily before breakfast.    . VITAMIN D, CHOLECALCIFEROL, PO Take 5,000 Units by mouth daily.     No current facility-administered medications on file prior to visit.  Rebbecca denied a history of head injuries and loss of consciousness.    Mental Health History: Demetrica reported she attended therapeutic services "off and on" during different points in her life for situational concerns, noting the last time was early 2021 due to  work stress. Albin FellingCarla reported there is no history of hospitalizations for psychiatric concerns. Albin FellingCarla denied a family history of mental health/substance abuse related concerns. Albin FellingCarla reported there is no history of trauma including psychological, physical  and sexual abuse, as well as neglect.   Albin FellingCarla  described her typical mood lately as "good, happy." She described feeling angry when she has to tell herself not to eat certain foods. Albin FellingCarla denied current alcohol use. She denied tobacco use. She denied illicit/recreational substance use. She denied caffeine intake. Furthermore, Albin FellingCarla indicated she is not experiencing the following: hallucinations and delusions, paranoia, symptoms of mania , social withdrawal, crying spells, panic attacks and decreased motivation. She also denied history of and current suicidal ideation, plan, and intent; history of and current homicidal ideation, plan, and intent; and history of and current engagement in self-harm.  Legal History: Albin FellingCarla reported there is no history of legal involvement.   Structured Assessments Results: The Patient Health Questionnaire-9 (PHQ-9) is a self-report measure that assesses symptoms and severity of depression over the course of the last two weeks. Albin FellingCarla obtained a score of 5 suggesting mild depression. Albin FellingCarla finds the endorsed symptoms to be somewhat difficult. [0= Not at all; 1= Several days; 2= More than half the days; 3= Nearly every day] Little interest or pleasure in doing things 0  Feeling down, depressed, or hopeless 0  Trouble falling or staying asleep, or sleeping too much 0  Feeling tired or having little energy 1  Poor appetite or overeating 1  Feeling bad about yourself --- or that you are a failure or have let yourself or your family down 3  Trouble concentrating on things, such as reading the newspaper or watching television 0  Moving or speaking so slowly that other people could have noticed? Or the opposite --- being so fidgety or restless that you have been moving around a lot more than usual 0  Thoughts that you would be better off dead or hurting yourself in some way 0  PHQ-9 Score 5    The Generalized Anxiety Disorder-7 (GAD-7) is a brief self-report measure that assesses symptoms of anxiety over the course of the  last two weeks. Albin FellingCarla obtained a score of 1 suggesting minimal anxiety. Albin FellingCarla finds the endorsed symptoms to be somewhat difficult. [0= Not at all; 1= Several days; 2= Over half the days; 3= Nearly every day] Feeling nervous, anxious, on edge 1  Not being able to stop or control worrying 0  Worrying too much about different things 0  Trouble relaxing 0  Being so restless that it's hard to sit still 0  Becoming easily annoyed or irritable 0  Feeling afraid as if something awful might happen 0  GAD-7 Score 1   Interventions:  Conducted a chart review Focused on rapport building Verbally administered PHQ-9 and GAD-7 for symptom monitoring Verbally administered Food & Mood questionnaire to assess various behaviors related to emotional eating Provided emphatic reflections and validation Collaborated with patient on a treatment goal  Psychoeducation provided regarding physical versus emotional hunger  Psychoeducation regarding anger as a secondary emotion  Provisional DSM-5 Diagnosis(es): 311 (F32.8) Other Specified Depressive Disorder, Emotional Eating Behaviors  Plan: Albin FellingCarla appears able and willing to participate as evidenced by collaboration on a treatment goal, engagement in reciprocal conversation, and asking questions as needed for clarification. The next appointment will be scheduled in two weeks, which will be via MyChart Video Visit. The following treatment goal was established: increase coping skills. This provider will regularly  review the treatment plan and medical chart to keep informed of status changes. Honesti expressed understanding and agreement with the initial treatment plan of care. Dawnette will be sent a handout via e-mail to utilize between now and the next appointment to increase awareness of hunger patterns and subsequent eating. Pecolia provided verbal consent during today's appointment for this provider to send the handout via e-mail.

## 2020-12-28 LAB — HEMOGLOBIN A1C
Est. average glucose Bld gHb Est-mCnc: 126 mg/dL
Hgb A1c MFr Bld: 6 % — ABNORMAL HIGH (ref 4.8–5.6)

## 2020-12-28 LAB — CBC WITH DIFFERENTIAL/PLATELET
Basophils Absolute: 0 10*3/uL (ref 0.0–0.2)
Basos: 1 %
EOS (ABSOLUTE): 0.2 10*3/uL (ref 0.0–0.4)
Eos: 2 %
Hematocrit: 41.2 % (ref 34.0–46.6)
Hemoglobin: 13.7 g/dL (ref 11.1–15.9)
Immature Grans (Abs): 0 10*3/uL (ref 0.0–0.1)
Immature Granulocytes: 1 %
Lymphocytes Absolute: 1.4 10*3/uL (ref 0.7–3.1)
Lymphs: 17 %
MCH: 28.9 pg (ref 26.6–33.0)
MCHC: 33.3 g/dL (ref 31.5–35.7)
MCV: 87 fL (ref 79–97)
Monocytes Absolute: 0.6 10*3/uL (ref 0.1–0.9)
Monocytes: 8 %
Neutrophils Absolute: 5.9 10*3/uL (ref 1.4–7.0)
Neutrophils: 71 %
Platelets: 305 10*3/uL (ref 150–450)
RBC: 4.74 x10E6/uL (ref 3.77–5.28)
RDW: 13.2 % (ref 11.7–15.4)
WBC: 8.1 10*3/uL (ref 3.4–10.8)

## 2020-12-28 LAB — COMPREHENSIVE METABOLIC PANEL
ALT: 23 IU/L (ref 0–32)
AST: 18 IU/L (ref 0–40)
Albumin/Globulin Ratio: 1.5 (ref 1.2–2.2)
Albumin: 4 g/dL (ref 3.8–4.9)
Alkaline Phosphatase: 109 IU/L (ref 44–121)
BUN/Creatinine Ratio: 25 — ABNORMAL HIGH (ref 9–23)
BUN: 17 mg/dL (ref 6–24)
Bilirubin Total: 0.4 mg/dL (ref 0.0–1.2)
CO2: 18 mmol/L — ABNORMAL LOW (ref 20–29)
Calcium: 8.9 mg/dL (ref 8.7–10.2)
Chloride: 108 mmol/L — ABNORMAL HIGH (ref 96–106)
Creatinine, Ser: 0.69 mg/dL (ref 0.57–1.00)
Globulin, Total: 2.7 g/dL (ref 1.5–4.5)
Glucose: 110 mg/dL — ABNORMAL HIGH (ref 65–99)
Potassium: 4.5 mmol/L (ref 3.5–5.2)
Sodium: 145 mmol/L — ABNORMAL HIGH (ref 134–144)
Total Protein: 6.7 g/dL (ref 6.0–8.5)
eGFR: 102 mL/min/{1.73_m2} (ref >59)

## 2020-12-28 LAB — LIPID PANEL WITH LDL/HDL RATIO
Cholesterol, Total: 141 mg/dL (ref 100–199)
HDL: 44 mg/dL (ref >39)
LDL Chol Calc (NIH): 81 mg/dL (ref 0–99)
LDL/HDL Ratio: 1.8 ratio (ref 0.0–3.2)
Triglycerides: 80 mg/dL (ref 0–149)
VLDL Cholesterol Cal: 16 mg/dL (ref 5–40)

## 2020-12-28 LAB — FOLATE: Folate: 6.1 ng/mL (ref >3.0)

## 2020-12-28 LAB — VITAMIN D 25 HYDROXY (VIT D DEFICIENCY, FRACTURES): Vit D, 25-Hydroxy: 55.2 ng/mL (ref 30.0–100.0)

## 2020-12-28 LAB — INSULIN, RANDOM: INSULIN: 20.4 u[IU]/mL (ref 2.6–24.9)

## 2020-12-28 LAB — VITAMIN B12: Vitamin B-12: 801 pg/mL (ref 232–1245)

## 2020-12-28 NOTE — Progress Notes (Signed)
Chief Complaint:   OBESITY Laura Knox (MR# [id number]) is a 55 y.o. female who presents for evaluation and treatment of obesity and related comorbidities. Current BMI is Body mass index is 61.01 kg/m. Laura Knox has been struggling with her weight for many years and has been unsuccessful in either losing weight, maintaining weight loss, or reaching her healthy weight goal.  Laura Knox is followed by a Holistic provider, who manages her hormones and thyroid.  Laura Knox is currently in the action stage of change and ready to dedicate time achieving and maintaining a healthier weight. Laura Knox is interested in becoming our patient and working on intensive lifestyle modifications including (but not limited to) diet and exercise for weight loss.  Laura Knox's habits were reviewed today and are as follows: Her family eats meals together, she thinks her family will eat healthier with her, her desired weight loss is 178 lbs, she has been heavy most of her life, she started gaining weight in college, her heaviest weight ever was >400 pounds, she has significant food cravings issues, she is frequently drinking liquids with calories, she frequently makes poor food choices, she has problems with excessive hunger, she frequently eats larger portions than normal and she struggles with emotional eating.  Depression Screen Laura Knox Food and Mood (modified PHQ-9) score was 20.  Depression screen [facility] 2/9 12/27/2020  Decreased Interest 3  Down, Depressed, Hopeless 2  PHQ - 2 Score 5  Altered sleeping 3  Tired, decreased energy 3  Change in appetite 3  Feeling bad or failure about yourself  3  Trouble concentrating 0  Moving slowly or fidgety/restless 3  Suicidal thoughts 0  PHQ-9 Score 20  Difficult doing work/chores Not difficult at all   Subjective:   1. Other fatigue Laura Knox admits to daytime somnolence and admits to waking up still tired. Patent has a history of symptoms of daytime fatigue and morning headache.  Laura Knox generally gets 6 or 7 hours of sleep per night, and states that she has difficulty falling asleep. Snoring is present. Apneic episodes are present. Epworth Sleepiness Score is 3.  2. Sleep apnea, unspecified type Laura Knox is unable to tolerate CPAP, and she is working on diet and weight loss.  3. Vitamin D deficiency Laura Knox has no recent Vit D labs done, and she notes fatigue.  4. B12 deficiency Laura Knox is on multivitamins, and she is due to have her B12 checked. She denies a history of pernicious anemia or gastric surgery.  5. Essential hypertension Laura Knox recently was prescribed losartan, but she hasn't started it yet.  6. At risk for heart disease Laura Knox is at a higher than average risk for cardiovascular disease due to obesity.   Assessment/Plan:   1. Other fatigue Laura Knox does feel that her weight is causing her energy to be lower than it should be. Fatigue may be related to obesity, depression or many other causes. Labs will be ordered, and in the meanwhile, Laura Knox will focus on self care including making healthy food choices, increasing physical activity and focusing on stress reduction.  - CBC with Differential/Platelet - EKG 12-Lead - Folate - Hemoglobin A1c - Insulin, random - Lipid Panel With LDL/HDL Ratio - Comprehensive metabolic panel  2. Sleep apnea, unspecified type Intensive lifestyle modifications are the first line treatment for this issue. We discussed several lifestyle modifications today. IC was done today. Laura Knox will work on diet, exercise and weight loss efforts. We will continue to monitor. Orders and follow up as documented in patient  record.   3. Vitamin D deficiency Low Vitamin D level contributes to fatigue and are associated with obesity, breast, and colon cancer. We will check labs today. Laura Knox will follow-up for routine testing of Vitamin D, at least 2-3 times per year to avoid over-replacement.  - VITAMIN D 25 Hydroxy (Vit-D Deficiency, Fractures)  4.  B12 deficiency The diagnosis was reviewed with the patient. Counseling provided today, see below. We will check labs today, and will continue to monitor. Orders and follow up as documented in patient record.  - Vitamin B12  5. Essential hypertension Laura Knox will start her medications as soon as possible, and will start her Category 4 plan. She will continue working on healthy weight loss and exercise to improve blood pressure control. We will watch for signs of hypotension as she continues her lifestyle modifications.  6. Screening for depression Laura Knox had a positive depression screening. Depression is commonly associated with obesity and often results in emotional eating behaviors. We will monitor this closely and work on CBT to help improve the non-hunger eating patterns. Referral to Psychology may be required if no improvement is seen as she continues in our clinic.  7. At risk for heart disease Laura Knox was given approximately 30 minutes of coronary artery disease prevention counseling today. She is 55 y.o. female and has risk factors for heart disease including obesity. We discussed intensive lifestyle modifications today with an emphasis on specific weight loss instructions and strategies.   Repetitive spaced learning was employed today to elicit superior memory formation and behavioral change.  8. Class 3 severe obesity with serious comorbidity and body mass index (BMI) of 60.0 to 69.9 in adult, unspecified obesity type (HCC) Laura Knox is currently in the action stage of change and her goal is to continue with weight loss efforts. I recommend Laura Knox begin the structured treatment plan as follows:  She has agreed to the Category 4 Plan.  Exercise goals: No exercise has been prescribed for now, while we concentrate on nutritional changes.  Behavioral modification strategies: increasing lean protein intake and meal planning and cooking strategies.  She was informed of the importance of frequent  follow-up visits to maximize her success with intensive lifestyle modifications for her multiple health conditions. She was informed we would discuss her lab results at her next visit unless there is a critical issue that needs to be addressed sooner. Laura Knox agreed to keep her next visit at the agreed upon time to discuss these results.  Objective:   Blood pressure (!) 144/87, pulse 78, temperature 98.4 F (36.9 C), height 5\' 6"  (1.676 m), weight (!) 378 lb (171.5 kg), SpO2 97 %. Body mass index is 61.01 kg/m.  EKG: Normal sinus rhythm, rate 80 BPM.  Indirect Calorimeter completed today shows a VO2 of 410 and a REE of 2851.  Her calculated basal metabolic rate is thus her basal metabolic rate is better than expected.  General: Cooperative, alert, well developed, in no acute distress. HEENT: Conjunctivae and lids unremarkable. Cardiovascular: Regular rhythm.  Lungs: Normal work of breathing. Neurologic: No focal deficits.   Lab Results  Component Value Date   CREATININE 0.69 12/27/2020   BUN 17 12/27/2020   NA 145 (H) 12/27/2020   K 4.5 12/27/2020   CL 108 (H) 12/27/2020   CO2 18 (L) 12/27/2020   Lab Results  Component Value Date   ALT 23 12/27/2020   AST 18 12/27/2020   ALKPHOS 109 12/27/2020   BILITOT 0.4 12/27/2020   Lab Results  Component Value Date   HGBA1C 6.0 (H) 12/27/2020   Lab Results  Component Value Date   INSULIN 20.4 12/27/2020   Lab Results  Component Value Date   TSH 0.75 12/20/2020   Lab Results  Component Value Date   CHOL 141 12/27/2020   HDL 44 12/27/2020   LDLCALC 81 12/27/2020   TRIG 80 12/27/2020   Lab Results  Component Value Date   WBC 8.1 12/27/2020   HGB 13.7 12/27/2020   HCT 41.2 12/27/2020   MCV 87 12/27/2020   PLT 305 12/27/2020   No results found for: IRON, TIBC, FERRITIN  Attestation Statements:   Reviewed by clinician on day of visit: allergies, medications, problem list, medical history, surgical history, family  history, social history, and previous encounter notes.   I, Burt Knack, am acting as transcriptionist for Quillian Quince, MD.  I have reviewed the above documentation for accuracy and completeness, and I agree with the above. - Quillian Quince, MD

## 2021-01-01 ENCOUNTER — Encounter (INDEPENDENT_AMBULATORY_CARE_PROVIDER_SITE_OTHER): Payer: Self-pay

## 2021-01-01 ENCOUNTER — Telehealth (INDEPENDENT_AMBULATORY_CARE_PROVIDER_SITE_OTHER): Payer: BC Managed Care – PPO | Admitting: Psychology

## 2021-01-01 DIAGNOSIS — F3289 Other specified depressive episodes: Secondary | ICD-10-CM | POA: Diagnosis not present

## 2021-01-01 NOTE — Progress Notes (Signed)
  Office: [phone]  /  Fax: [phone]    Date: January 15, 2021   Appointment Start Time: 2:34pm Duration: 31 minutes Provider: Lawerance Cruel, Psy.D. Type of Session: Individual Therapy  Location of Patient: Home Location of Provider: Provider's Home (private office) Type of Contact: Telepsychological Visit via MyChart Video Visit & Telephone call   Session Content: This provider called Laura Knox at 2:32pm as she did not present for the telepsychological appointment. Assistance with connecting was provided. Of note, today's appointment was switched to a regular telephone call at 2:36pm with Laura Knox's verbal consent due to technical issues on her end. However, she was able to connect via MyChart Video Visit at 2:44pm; therefore, the appointment was changed back to a MyChart Video Visit. Laura Knox is a 55 y.o. female presenting for a follow-up appointment to address the previously established treatment goal of increasing coping skills. Today's appointment was a telepsychological visit due to COVID-19. Laura Knox provided verbal consent for today's telepsychological appointment and she is aware she is responsible for securing confidentiality on her end of the session. Prior to proceeding with today's appointment, Laura Knox's physical location at the time of this appointment was obtained as well a phone number she could be reached at in the event of technical difficulties. Laura Knox and this provider participated in today's telepsychological service.   This provider conducted a brief check-in. Laura Knox shared about her first follow-up appointment with the clinic, noting she lost six pounds. Reviewed physical and emotional hunger. Psychoeducation regarding triggers for emotional eating was provided. Laura Knox was provided a handout, and encouraged to utilize the handout between now and the next appointment to increase awareness of triggers and frequency. Laura Knox agreed. This provider also discussed behavioral strategies for specific  triggers, such as placing the utensil down when conversing to avoid mindless eating. Laura Knox provided verbal consent during today's appointment for this provider to send a handout about triggers via e-mail. Laura Knox was receptive to today's appointment as evidenced by openness to sharing, responsiveness to feedback, and willingness to explore triggers for emotional eating.  Mental Status Examination:  Appearance: well groomed and appropriate hygiene  Behavior: appropriate to circumstances Mood: euthymic Affect: mood congruent Speech: normal in rate, volume, and tone Eye Contact: appropriate Psychomotor Activity: unable to assess  Gait: unable to assess Thought Process: linear, logical, and goal directed  Thought Content/Perception: no hallucinations, delusions, bizarre thinking or behavior reported or observed and no evidence of suicidal and homicidal ideation, plan, and intent Orientation: time, person, place, and purpose of appointment Memory/Concentration: memory, attention, language, and fund of knowledge intact  Insight/Judgment: good  Interventions:  Conducted a brief chart review Provided empathic reflections and validation Reviewed content from the previous session Employed supportive psychotherapy interventions to facilitate reduced distress and to improve coping skills with identified stressors Psychoeducation provided regarding triggers for emotional eating  DSM-5 Diagnosis(es): 311 (F32.8) Other Specified Depressive Disorder, Emotional Eating Behaviors  Treatment Goal & Progress: During the initial appointment with this provider, the following treatment goal was established: increase coping skills. Laura Knox has demonstrated progress in her goal as evidenced by increased awareness of hunger patterns.   Plan: Based on progress to date and Laura Knox's request, the next appointment will be scheduled in three weeks, which will be via MyChart Video Visit. The next session will focus on working  towards the established treatment goal.

## 2021-01-10 ENCOUNTER — Other Ambulatory Visit: Payer: Self-pay

## 2021-01-10 ENCOUNTER — Ambulatory Visit (INDEPENDENT_AMBULATORY_CARE_PROVIDER_SITE_OTHER): Payer: BC Managed Care – PPO | Admitting: Family Medicine

## 2021-01-10 ENCOUNTER — Telehealth: Payer: Self-pay | Admitting: *Deleted

## 2021-01-10 ENCOUNTER — Encounter (INDEPENDENT_AMBULATORY_CARE_PROVIDER_SITE_OTHER): Payer: Self-pay | Admitting: Family Medicine

## 2021-01-10 VITALS — BP 152/78 | HR 79 | Temp 98.2°F | Ht 66.0 in | Wt 372.0 lb

## 2021-01-10 DIAGNOSIS — Z9189 Other specified personal risk factors, not elsewhere classified: Secondary | ICD-10-CM | POA: Diagnosis not present

## 2021-01-10 DIAGNOSIS — Z6841 Body Mass Index (BMI) 40.0 and over, adult: Secondary | ICD-10-CM | POA: Diagnosis not present

## 2021-01-10 DIAGNOSIS — R7303 Prediabetes: Secondary | ICD-10-CM

## 2021-01-10 DIAGNOSIS — I10 Essential (primary) hypertension: Secondary | ICD-10-CM | POA: Diagnosis not present

## 2021-01-10 MED ORDER — METFORMIN HCL 500 MG PO TABS: 500.0000 mg | ORAL_TABLET | Freq: Every day | ORAL | 0 refills | Status: DC

## 2021-01-10 NOTE — Telephone Encounter (Deleted)
Dr Chales Abrahams,  This pt is a direct screen colon with you - Colon 4-18 Monday She has a BMI of 61.04-   No GI hx Do you want  her to have an OV ?  Please advise- Thanks so much, Hilda Lias PV

## 2021-01-10 NOTE — Telephone Encounter (Signed)
OV 4-20 at 850 am with Dr Chales Abrahams

## 2021-01-10 NOTE — Telephone Encounter (Signed)
error 

## 2021-01-10 NOTE — Telephone Encounter (Signed)
Pt's BMi is 61.04- she will needasn OV with Dr Chales Abrahams- I canceled her 4-1 PV, and her 5-5 colon- called pt- LM to return call as she needs to sch OV Hilda Lias PV

## 2021-01-15 ENCOUNTER — Telehealth (INDEPENDENT_AMBULATORY_CARE_PROVIDER_SITE_OTHER): Payer: BC Managed Care – PPO | Admitting: Psychology

## 2021-01-15 ENCOUNTER — Other Ambulatory Visit: Payer: Self-pay

## 2021-01-15 ENCOUNTER — Ambulatory Visit (INDEPENDENT_AMBULATORY_CARE_PROVIDER_SITE_OTHER): Payer: BC Managed Care – PPO | Admitting: Medical

## 2021-01-15 ENCOUNTER — Encounter: Payer: Self-pay | Admitting: Medical

## 2021-01-15 VITALS — BP 150/80 | HR 87 | Resp 18 | Ht 66.0 in | Wt 376.0 lb

## 2021-01-15 DIAGNOSIS — Z6841 Body Mass Index (BMI) 40.0 and over, adult: Secondary | ICD-10-CM | POA: Diagnosis not present

## 2021-01-15 DIAGNOSIS — F3289 Other specified depressive episodes: Secondary | ICD-10-CM | POA: Diagnosis not present

## 2021-01-15 DIAGNOSIS — I10 Essential (primary) hypertension: Secondary | ICD-10-CM | POA: Diagnosis not present

## 2021-01-15 MED ORDER — LOSARTAN POTASSIUM 100 MG PO TABS: 100.0000 mg | ORAL_TABLET | Freq: Every day | ORAL | 3 refills | Status: DC

## 2021-01-15 NOTE — Patient Instructions (Addendum)
Your blood pressure is improved but not controlled. Will increase your losartan to 100 mg daily.   Good job on your 6 lb weight loss. Continue with weight loss management.  With weight loss your sleep apnea might be improved with signficant wt loss.  Prediabetes and recently given metformin.  Follow up 2 months or as needed.(but 10 days update on bp level with increased losartan dose)

## 2021-01-15 NOTE — Progress Notes (Signed)
Subjective:    Patient ID: Laura Knox, female    DOB: 1966-07-28, 55 y.o.   MRN: [id number]  HPI  Pt in for follow up.  Pt bp here was high.  Pt bp ranges 140-150/80-90. Most of the time.  BP's 155/95, 148/95, 145/81, 153/99, 146/87, 151/84, 140/98, 156/89, 135/86, 154/89, 141/75, 144/88, 149/95, 139/80, 126/75, 135/74, 135/75, 153/90, 148/85, 151/74, 126/73, 144/81, 136/74, 151/74, 149/100 and  135/78.  Pt has lost 6 pounds.  Pt never been on cpap.  Last saw sleep specailist 5-6 years ago. She stats can't tolerate the equipment.   Pt has already lost 6 pounds with Weight loss management.    Review of Systems  Constitutional: Negative for appetite change, diaphoresis, fatigue and fever.  Respiratory: Negative for cough, chest tightness, shortness of breath and wheezing.   Cardiovascular: Negative for chest pain and palpitations.  Gastrointestinal: Negative for abdominal pain, constipation, nausea and vomiting.  Genitourinary: Negative for dysuria, flank pain and genital sores.  Musculoskeletal: Negative for back pain and gait problem.  Skin: Negative for rash.  Neurological: Negative for dizziness.  Hematological: Negative for adenopathy. Does not bruise/bleed easily.  Psychiatric/Behavioral: Negative for behavioral problems and confusion. The patient is not nervous/anxious.      Past Medical History:  Diagnosis Date  . B12 deficiency   . Bilateral swelling of feet and ankles   . Hypertension   . Joint pain   . Kidney stones   . Knee pain   . Osteoarthritis   . Other fatigue   . Prediabetes   . Sleep apnea   . Thyroid disease   . Vitamin D deficiency      Social History   Socioeconomic History  . Marital status: Married    Spouse name: Greig Castilla  . Number of children: 0  . Years of education: Not on file  . Highest education level: Not on file  Occupational History  . Occupation: Retired  Tobacco Use  . Smoking status: Never Smoker  . Smokeless tobacco:  Never Used  Vaping Use  . Vaping Use: Never used  Substance and Sexual Activity  . Alcohol use: Not Currently  . Drug use: Never  . Sexual activity: Yes    Partners: Male  Other Topics Concern  . Not on file  Social History Narrative  . Not on file   Social Determinants of Health   Financial Resource Strain: Not on file  Food Insecurity: Not on file  Transportation Needs: Not on file  Physical Activity: Not on file  Stress: Not on file  Social Connections: Not on file  Intimate Partner Violence: Not on file    Past Surgical History:  Procedure Laterality Date  . KIDNEY STONE SURGERY    . miniscus repair Left   . SEPTOPLASTY      Family History  Problem Relation Age of Onset  . Healthy Mother   . Obesity Mother   . Cancer Father   . Obesity Father     No Known Allergies  Current Outpatient Medications on File Prior to Visit  Medication Sig Dispense Refill  . Cyanocobalamin (B-12) 1000 MCG TABS Take 1 tablet by mouth daily.    Marland Kitchen estradiol (VIVELLE-DOT) 0.05 MG/24HR patch Place 1 patch onto the skin 2 (two) times a week.    . metFORMIN (GLUCOPHAGE) 500 MG tablet Take 1 tablet (500 mg total) by mouth daily with breakfast. 30 tablet 0  . Naproxen Sodium 220 MG CAPS Take 3 tablets by mouth 2 (  two) times daily.    . progesterone (PROMETRIUM) 100 MG capsule Take by mouth.    . pyridOXINE (VITAMIN B-6) 25 MG tablet Take by mouth.    . thyroid (ARMOUR) 120 MG tablet Take 120 mg by mouth daily before breakfast.    . VITAMIN D, CHOLECALCIFEROL, PO Take 5,000 Units by mouth daily.     No current facility-administered medications on file prior to visit.    BP (!) 150/80   Pulse 87   Resp 18   Ht 5\' 6"  (1.676 m)   Wt (!) 376 lb (170.6 kg)   SpO2 96%   BMI 60.69 kg/m       Objective:   Physical Exam  General Mental Status- Alert. General Appearance- Not in acute distress.   Skin General: Color- Normal Color. Moisture- Normal Moisture.  Neck Carotid  Arteries- Normal color. Moisture- Normal Moisture. No carotid bruits. No JVD.  Chest and Lung Exam Auscultation: Breath Sounds:-Normal.  Cardiovascular Auscultation:Rythm- Regular. Murmurs & Other Heart Sounds:Auscultation of the heart reveals- No Murmurs.  Abdomen Inspection:-Inspeection Normal. Palpation/Percussion:Note:No mass. Palpation and Percussion of the abdomen reveal- Non Tender, Non Distended + BS, no rebound or guarding.   Neurologic Cranial Nerve exam:- CN III-XII intact(No nystagmus), symmetric smile. Strength:- 5/5 equal and symmetric strength both upper and lower extremities.      Assessment & Plan:  Your blood pressure is improved but not controlled. Will increase your losartan to 100 mg daily.   Good job on your 6 lb weight loss. Continue with weight loss management.  With weight loss your sleep apnea might be improved with signficant wt loss.  Prediabetes and recently given metformin.  Follow up 2 months or as needed.

## 2021-01-16 ENCOUNTER — Encounter: Payer: Self-pay | Admitting: Pulmonary Disease

## 2021-01-16 ENCOUNTER — Ambulatory Visit (INDEPENDENT_AMBULATORY_CARE_PROVIDER_SITE_OTHER): Payer: BC Managed Care – PPO | Admitting: Pulmonary Disease

## 2021-01-16 VITALS — BP 134/86 | HR 71 | Temp 97.8°F | Ht 66.0 in | Wt 374.6 lb

## 2021-01-16 DIAGNOSIS — R9389 Abnormal findings on diagnostic imaging of other specified body structures: Secondary | ICD-10-CM

## 2021-01-16 NOTE — Progress Notes (Unsigned)
Chief Complaint:   OBESITY Laura Knox is here to discuss her progress with her obesity treatment plan along with follow-up of her obesity related diagnoses. [name] is on the Category 4 Plan and states she is following her eating plan approximately 70% of the time. [name] states she is doing Geophysicist/field seismologist and water aerobics for 60 minutes 6 times per week.  Today's visit was #: 2 Starting weight: 378 lbs Starting date: 12/27/2020 Today's weight: 372 lbs Today's date: 01/10/2021 Total lbs lost to date: 6 Total lbs lost since last in-office visit: 6  Interim History: Jaylee has done very well with weight loss on her Category 4 plan. She did well moving her food options around to fit her better. She would like more options for breakfast.  Subjective:   1. Essential hypertension Laura Knox's blood pressure is elevated today. She started losartan approximately 2 weeks ago. She has done well with diet and weight loss.  2. Pre-diabetes Laura Knox's A1c, fasting insulin, and fasting glucose are elevated. She has done well with diet and weight loss. I discussed labs with the patient today.  3. At risk for heart disease Laura Knox is at a higher than average risk for cardiovascular disease due to obesity.   Assessment/Plan:   1. Essential hypertension Laura Knox will continue diet, exercise, and healthy weight loss to improve blood pressure control. We will watch for signs of hypotension as she continues her lifestyle modifications. We will recheck her blood pressure at her next visit.  2. Pre-diabetes Laura Knox agreed to start metformin 500 mg q AM with food, with no refills. She will continue to work on weight loss, exercise, and decreasing simple carbohydrates to help decrease the risk of diabetes.   - metFORMIN (GLUCOPHAGE) 500 MG tablet; Take 1 tablet (500 mg total) by mouth daily with breakfast.  Dispense: 30 tablet; Refill: 0  3. At risk for heart disease Laura Knox was given approximately 30 minutes of coronary  artery disease prevention counseling today. She is 55 y.o. female and has risk factors for heart disease including obesity. We discussed intensive lifestyle modifications today with an emphasis on specific weight loss instructions and strategies.   Repetitive spaced learning was employed today to elicit superior memory formation and behavioral change.  4. Class 3 severe obesity with serious comorbidity and body mass index (BMI) of 60.0 to 69.9 in adult, unspecified obesity type (HCC) Laura Knox is currently in the action stage of change. As such, her goal is to continue with weight loss efforts. She has agreed to the Category 4 Plan with breakfast options.   Exercise goals: As is.  Behavioral modification strategies: increasing lean protein intake and meal planning and cooking strategies.  Laura Knox has agreed to follow-up with our clinic in 2 weeks. She was informed of the importance of frequent follow-up visits to maximize her success with intensive lifestyle modifications for her multiple health conditions.   Objective:   Blood pressure (!) 152/78, pulse 79, temperature 98.2 F (36.8 C), height 5\' 6"  (1.676 m), weight (!) 372 lb (168.7 kg), SpO2 95 %. Body mass index is 60.04 kg/m.  General: Cooperative, alert, well developed, in no acute distress. HEENT: Conjunctivae and lids unremarkable. Cardiovascular: Regular rhythm.  Lungs: Normal work of breathing. Neurologic: No focal deficits.   Lab Results  Component Value Date   CREATININE 0.69 12/27/2020   BUN 17 12/27/2020   NA 145 (H) 12/27/2020   K 4.5 12/27/2020   CL 108 (H) 12/27/2020   CO2 18 (L)  12/27/2020   Lab Results  Component Value Date   ALT 23 12/27/2020   AST 18 12/27/2020   ALKPHOS 109 12/27/2020   BILITOT 0.4 12/27/2020   Lab Results  Component Value Date   HGBA1C 6.0 (H) 12/27/2020   Lab Results  Component Value Date   INSULIN 20.4 12/27/2020   Lab Results  Component Value Date   TSH 0.75 12/20/2020   Lab  Results  Component Value Date   CHOL 141 12/27/2020   HDL 44 12/27/2020   LDLCALC 81 12/27/2020   TRIG 80 12/27/2020   Lab Results  Component Value Date   WBC 8.1 12/27/2020   HGB 13.7 12/27/2020   HCT 41.2 12/27/2020   MCV 87 12/27/2020   PLT 305 12/27/2020   No results found for: IRON, TIBC, FERRITIN  Attestation Statements:   Reviewed by clinician on day of visit: allergies, medications, problem list, medical history, surgical history, family history, social history, and previous encounter notes.   I, Burt Knack, am acting as transcriptionist for Quillian Quince, MD.  I have reviewed the above documentation for accuracy and completeness, and I agree with the above. -  Quillian Quince, MD

## 2021-01-16 NOTE — Progress Notes (Signed)
Laura Knox    [id number]    06-17-1966  Primary Care Physician:Saguier, Kateri Mc  Referring Physician: Esperanza Richters, PA-C [id number] [place],  [place] [address]  Chief complaint:   Patient with abnormal chest x-ray  HPI:  Chest x-ray shows a left upper lung nodule  Looking through her chart-said nodule has been present since a chest x-ray report of 2013 notable for a chronic left upper lobe calcified nodule  Previous CT abdomen also did reveal calcifications in the spleen  Denies any significant symptoms at present  Has had bouts with bronchitis and pneumonias in the past, feels relatively well at present  History of obstructive sleep apnea, not able to tolerate CPAP  Is working on weight loss Bariatric program  Outpatient Encounter Medications as of 01/16/2021  Medication Sig  . Cyanocobalamin (B-12) 1000 MCG TABS Take 1 tablet by mouth daily.  Marland Kitchen estradiol (VIVELLE-DOT) 0.05 MG/24HR patch Place 1 patch onto the skin 2 (two) times a week.  . losartan (COZAAR) 100 MG tablet Take 1 tablet (100 mg total) by mouth daily.  . metFORMIN (GLUCOPHAGE) 500 MG tablet Take 1 tablet (500 mg total) by mouth daily with breakfast.  . Naproxen Sodium 220 MG CAPS Take 3 tablets by mouth 2 (two) times daily.  . progesterone (PROMETRIUM) 100 MG capsule Take by mouth.  . pyridOXINE (VITAMIN B-6) 25 MG tablet Take by mouth.  . thyroid (ARMOUR) 120 MG tablet Take 120 mg by mouth daily before breakfast.  . VITAMIN D, CHOLECALCIFEROL, PO Take 5,000 Units by mouth daily.   No facility-administered encounter medications on file as of 01/16/2021.    Allergies as of 01/16/2021  . (No Known Allergies)    Past Medical History:  Diagnosis Date  . B12 deficiency   . Bilateral swelling of feet and ankles   . Hypertension   . Joint pain   . Kidney stones   . Knee pain   . Osteoarthritis   . Other fatigue   . Prediabetes   . Sleep apnea   . Thyroid disease    . Vitamin D deficiency     Past Surgical History:  Procedure Laterality Date  . KIDNEY STONE SURGERY    . miniscus repair Left   . SEPTOPLASTY      Family History  Problem Relation Age of Onset  . Healthy Mother   . Obesity Mother   . Cancer Father   . Obesity Father     Social History   Socioeconomic History  . Marital status: Married    Spouse name: Greig Castilla  . Number of children: 0  . Years of education: Not on file  . Highest education level: Not on file  Occupational History  . Occupation: Retired  Tobacco Use  . Smoking status: Never Smoker  . Smokeless tobacco: Never Used  Vaping Use  . Vaping Use: Never used  Substance and Sexual Activity  . Alcohol use: Not Currently  . Drug use: Never  . Sexual activity: Yes    Partners: Male  Other Topics Concern  . Not on file  Social History Narrative  . Not on file   Social Determinants of Health   Financial Resource Strain: Not on file  Food Insecurity: Not on file  Transportation Needs: Not on file  Physical Activity: Not on file  Stress: Not on file  Social Connections: Not on file  Intimate Partner Violence: Not on file  Review of Systems  Respiratory: Negative for cough and shortness of breath.     Vitals:   01/16/21 1352  BP: 134/86  Pulse: 71  Temp: 97.8 F (36.6 C)  SpO2: 95%     Physical Exam HENT:     Head: Normocephalic.     Mouth/Throat:     Mouth: Mucous membranes are moist.  Eyes:     General: No scleral icterus.       Right eye: No discharge.        Left eye: No discharge.  Cardiovascular:     Rate and Rhythm: Normal rate and regular rhythm.     Heart sounds: No murmur heard. No friction rub.  Pulmonary:     Effort: No respiratory distress.     Breath sounds: No stridor. No wheezing or rhonchi.  Musculoskeletal:     Cervical back: No rigidity or tenderness.  Neurological:     Mental Status: She is alert.  Psychiatric:        Mood and Affect: Mood normal.    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

## 2021-01-16 NOTE — Patient Instructions (Signed)
Chest x-ray as reviewed shows a small nodule in the left upper lobe -Has been present since 2013 -Likely related to a chronic process as evidenced by the changes in the spleen-calcified nodules  No realistic expectation of any progression as it has been stable for so long -No other intervention recommended at present  Call with significant concerns  See you as needed

## 2021-01-24 ENCOUNTER — Other Ambulatory Visit: Payer: Self-pay

## 2021-01-24 ENCOUNTER — Encounter (INDEPENDENT_AMBULATORY_CARE_PROVIDER_SITE_OTHER): Payer: Self-pay | Admitting: Adult Health

## 2021-01-24 ENCOUNTER — Ambulatory Visit (INDEPENDENT_AMBULATORY_CARE_PROVIDER_SITE_OTHER): Payer: BC Managed Care – PPO | Admitting: Adult Health

## 2021-01-24 VITALS — BP 129/76 | HR 83 | Temp 98.1°F | Ht 66.0 in | Wt 374.0 lb

## 2021-01-24 DIAGNOSIS — I10 Essential (primary) hypertension: Secondary | ICD-10-CM

## 2021-01-24 DIAGNOSIS — Z6841 Body Mass Index (BMI) 40.0 and over, adult: Secondary | ICD-10-CM

## 2021-01-24 DIAGNOSIS — Z9189 Other specified personal risk factors, not elsewhere classified: Secondary | ICD-10-CM

## 2021-01-24 DIAGNOSIS — R7303 Prediabetes: Secondary | ICD-10-CM

## 2021-01-24 MED ORDER — METFORMIN HCL 500 MG PO TABS: 500.0000 mg | ORAL_TABLET | Freq: Every day | ORAL | 0 refills | Status: DC

## 2021-01-29 DIAGNOSIS — R7303 Prediabetes: Secondary | ICD-10-CM | POA: Insufficient documentation

## 2021-01-29 DIAGNOSIS — I10 Essential (primary) hypertension: Secondary | ICD-10-CM | POA: Insufficient documentation

## 2021-01-29 NOTE — Progress Notes (Signed)
Chief Complaint:   OBESITY Laura Knox is here to discuss her progress with her obesity treatment plan along with follow-up of her obesity related diagnoses. Laura Knox is on the Category 4 Plan with breakfast options and states she is following her eating plan approximately 70% of the time. Laura Knox states she is going to the gym 60 minutes 5-6 times per week.  Today's visit was #: 3 Starting weight: 378 lbs Starting date: 12/27/2020 Today's weight: 374 lbs Today's date: 01/24/2021 Total lbs lost to date: 4 lbs Total lbs lost since last in-office visit: 0  Interim History: Laura Knox needs to achieve BMI less than 40 for bilateral knee replacement. She would also like to reduce overall prescription medications for chronic disease.  Subjective:   1. Pre-diabetes Laura Knox's 12/27/2020 BG 110, A1c 6.0, and insulin level 20.4. She reports slower bowel movement since starting Metformin 500 mg QD.  Lab Results  Component Value Date   HGBA1C 6.0 (H) 12/27/2020   Lab Results  Component Value Date   INSULIN 20.4 12/27/2020    2. Essential hypertension Laura Knox's PCP started her on losartan 50 mg and titrated up to 100 mg QD. Her BP/HR are excellent at OV today.  BP Readings from Last 3 Encounters:  01/24/21 129/76  01/16/21 134/86  01/15/21 (!) 150/80    3. At risk for diabetes mellitus Laura Knox is at higher than average risk for developing diabetes due to obesity and pre-diabetes.  Assessment/Plan:   1. Pre-diabetes Laura Knox will continue to work on weight loss, exercise, and decreasing simple carbohydrates to help decrease the risk of diabetes.   - metFORMIN (GLUCOPHAGE) 500 MG tablet; Take 1 tablet (500 mg total) by mouth daily with breakfast.  Dispense: 30 tablet; Refill: 0  2. Essential hypertension Laura Knox is working on healthy weight loss and exercise to improve blood pressure control. We will watch for signs of hypotension as she continues her lifestyle modifications. Continue losartan 100 mg and  follow up with PCP.  3. At risk for diabetes mellitus Laura Knox was given approximately 15 minutes of diabetes education and counseling today. We discussed intensive lifestyle modifications today with an emphasis on weight loss as well as increasing exercise and decreasing simple carbohydrates in her diet. We also reviewed medication options with an emphasis on risk versus benefit of those discussed.   Repetitive spaced learning was employed today to elicit superior memory formation and behavioral change.  4. Class 3 severe obesity with serious comorbidity and body mass index (BMI) of 60.0 to 69.9 in adult, unspecified obesity type (HCC) Laura Knox is currently in the action stage of change. As such, her goal is to continue with weight loss efforts. She has agreed to the Category 4 Plan with breakfast options and keeping a food journal and adhering to recommended goals of 550-700 calories and 45 g protein.   Exercise goals: As is  Behavioral modification strategies: increasing lean protein intake, meal planning and cooking strategies and planning for success.  Laura Knox has agreed to follow-up with our clinic in 2 weeks. She was informed of the importance of frequent follow-up visits to maximize her success with intensive lifestyle modifications for her multiple health conditions.   Objective:   Blood pressure 129/76, pulse 83, temperature 98.1 F (36.7 C), height 5\' 6"  (1.676 m), weight (!) 374 lb (169.6 kg), SpO2 95 %. Body mass index is 60.37 kg/m.  General: Cooperative, alert, well developed, in no acute distress. HEENT: Conjunctivae and lids unremarkable. Cardiovascular: Regular rhythm.  Lungs:  Normal work of breathing. Neurologic: No focal deficits.   Lab Results  Component Value Date   CREATININE 0.69 12/27/2020   BUN 17 12/27/2020   NA 145 (H) 12/27/2020   K 4.5 12/27/2020   CL 108 (H) 12/27/2020   CO2 18 (L) 12/27/2020   Lab Results  Component Value Date   ALT 23 12/27/2020   AST  18 12/27/2020   ALKPHOS 109 12/27/2020   BILITOT 0.4 12/27/2020   Lab Results  Component Value Date   HGBA1C 6.0 (H) 12/27/2020   Lab Results  Component Value Date   INSULIN 20.4 12/27/2020   Lab Results  Component Value Date   TSH 0.75 12/20/2020   Lab Results  Component Value Date   CHOL 141 12/27/2020   HDL 44 12/27/2020   LDLCALC 81 12/27/2020   TRIG 80 12/27/2020   Lab Results  Component Value Date   WBC 8.1 12/27/2020   HGB 13.7 12/27/2020   HCT 41.2 12/27/2020   MCV 87 12/27/2020   PLT 305 12/27/2020    Attestation Statements:   Reviewed by clinician on day of visit: allergies, medications, problem list, medical history, surgical history, family history, social history, and previous encounter notes.  Edmund Hilda, am acting as Energy manager for William Hamburger, NP.  I have reviewed the above documentation for accuracy and completeness, and I agree with the above. -  Kennieth Plotts d. Domini Vandehei, NP-C

## 2021-02-02 ENCOUNTER — Other Ambulatory Visit (INDEPENDENT_AMBULATORY_CARE_PROVIDER_SITE_OTHER): Payer: Self-pay | Admitting: Family Medicine

## 2021-02-02 DIAGNOSIS — R7303 Prediabetes: Secondary | ICD-10-CM

## 2021-02-05 ENCOUNTER — Telehealth (INDEPENDENT_AMBULATORY_CARE_PROVIDER_SITE_OTHER): Payer: BC Managed Care – PPO | Admitting: Psychology

## 2021-02-05 DIAGNOSIS — F3289 Other specified depressive episodes: Secondary | ICD-10-CM | POA: Diagnosis not present

## 2021-02-05 NOTE — Telephone Encounter (Signed)
Last seen by Katy 

## 2021-02-05 NOTE — Progress Notes (Signed)
Office: [phone]  /  Fax: [phone]    Date: February 05, 2021    Appointment Start Time: 2:44pm Duration: 34 minutes Provider: Lawerance Cruel, Psy.D. Type of Session: Individual Therapy  Location of Patient: Home Location of Provider: Provider's Home (private office) Type of Contact: Telepsychological Visit via MyChart Video Visit  Session Content: This provider called Laura Knox at 2:33pm as she did not present for the telepsychological appointment. She indicated she was trying to sign on; however, Laura Knox was unable to connect with video and audio capabilities. A text message with a link was sent via MyChart Video Visit. As such, today's appointment was initiated 14 minutes late. Laura Knox is a 55 y.o. female presenting for a follow-up appointment to address the previously established treatment goal of increasing coping skills. Today's appointment was a telepsychological visit due to COVID-19. Laura Knox provided verbal consent for today's telepsychological appointment and she is aware she is responsible for securing confidentiality on her end of the session. Prior to proceeding with today's appointment, Laura Knox's physical location at the time of this appointment was obtained as well a phone number she could be reached at in the event of technical difficulties. Laura Knox and this provider participated in today's telepsychological service.   This provider conducted a brief check-in. Laura Knox shared it has been a "busy" few weeks. Recent eating habits were reviewed. She discussed she tried to stay on plan while with family, and described making better choices and engaging in portion control. Positive reinforcement was provided. She indicated she is "back to [her] normal routine" today.  Reviewed triggers for emotional eating. Laura Knox acknowledged out of habit eating is common when with family. Thus, psychoeducation regarding making better choices and engaging in portion control during holidays/celebrations/vacations was  provided. More specifically, this provider discussed the following strategies: coming to meals hungry, but not starving; avoid filling up on appetizers; managing portion sizes; not completely depriving yourself; making the plate colorful (e.g., vegetables); pacing yourself (e.g., waiting 10 minutes before going back for seconds); taking advantage of the nutritious foods; practicing mindfulness; staying hydrated; and avoid bringing home leftovers. Overall, Laura Knox was receptive to today's appointment as evidenced by openness to sharing, responsiveness to feedback, and willingness to implement discussed strategies .  Mental Status Examination:  Appearance: well groomed and appropriate hygiene  Behavior: appropriate to circumstances Mood: euthymic Affect: mood congruent Speech: normal in rate, volume, and tone Eye Contact: appropriate Psychomotor Activity: unable to assess  Gait: unable to assess Thought Process: linear, logical, and goal directed  Thought Content/Perception: no hallucinations, delusions, bizarre thinking or behavior reported or observed and no evidence of suicidal and homicidal ideation, plan, and intent Orientation: time, person, place, and purpose of appointment Memory/Concentration: memory, attention, language, and fund of knowledge intact  Insight/Judgment: good  Interventions:  Conducted a brief chart review Provided empathic reflections and validation Reviewed content from the previous session Employed supportive psychotherapy interventions to facilitate reduced distress and to improve coping skills with identified stressors Discussed behavioral strategies for holidays/vacations/celebrations  DSM-5 Diagnosis(es): 311 (F32.8) Other Specified Depressive Disorder, Emotional Eating Behaviors  Treatment Goal & Progress: During the initial appointment with this provider, the following treatment goal was established: increase coping skills. Laura Knox has demonstrated progress in her  goal as evidenced by increased awareness of hunger patterns and increased awareness of triggers for emotional eating. Laura Knox also continues to demonstrate willingness to engage in learned skill(s).  Plan: The next appointment will be scheduled in three weeks, which will be via MyChart Video Visit. The  next session will focus on working towards the established treatment goal.

## 2021-02-06 DIAGNOSIS — Z789 Other specified health status: Secondary | ICD-10-CM | POA: Diagnosis not present

## 2021-02-06 DIAGNOSIS — R5383 Other fatigue: Secondary | ICD-10-CM | POA: Diagnosis not present

## 2021-02-06 DIAGNOSIS — E8881 Metabolic syndrome: Secondary | ICD-10-CM | POA: Diagnosis not present

## 2021-02-06 DIAGNOSIS — E669 Obesity, unspecified: Secondary | ICD-10-CM | POA: Diagnosis not present

## 2021-02-06 DIAGNOSIS — E538 Deficiency of other specified B group vitamins: Secondary | ICD-10-CM | POA: Diagnosis not present

## 2021-02-06 DIAGNOSIS — E039 Hypothyroidism, unspecified: Secondary | ICD-10-CM | POA: Diagnosis not present

## 2021-02-06 DIAGNOSIS — N951 Menopausal and female climacteric states: Secondary | ICD-10-CM | POA: Diagnosis not present

## 2021-02-06 DIAGNOSIS — E559 Vitamin D deficiency, unspecified: Secondary | ICD-10-CM | POA: Diagnosis not present

## 2021-02-06 LAB — TESTOSTERONE: Testosterone: 8.7

## 2021-02-06 LAB — VITAMIN D 25 HYDROXY (VIT D DEFICIENCY, FRACTURES): Vit D, 25-Hydroxy: 53.2

## 2021-02-06 LAB — BASIC METABOLIC PANEL
BUN: 21 (ref 4–21)
CO2: 29 — AB (ref 13–22)
Chloride: 105 (ref 99–108)
Creatinine: 0.6 (ref 0.5–1.1)
Glucose: 84
Potassium: 3.8 (ref 3.4–5.3)
Sodium: 135 — AB (ref 137–147)

## 2021-02-06 LAB — COMPREHENSIVE METABOLIC PANEL
Albumin: 4.2 (ref 3.5–5.0)
Calcium: 9 (ref 8.7–10.7)
GFR calc Af Amer: 128
GFR calc non Af Amer: 106

## 2021-02-06 LAB — CBC AND DIFFERENTIAL
HCT: 42 (ref 36–46)
Hemoglobin: 13.8 (ref 12.0–16.0)
Platelets: 336 (ref 150–399)
WBC: 8.4

## 2021-02-06 LAB — HEMOGLOBIN A1C: Hemoglobin A1C: 5.9

## 2021-02-06 LAB — HEPATIC FUNCTION PANEL
ALT: 26 (ref 7–35)
AST: 19 (ref 13–35)
Alkaline Phosphatase: 81 (ref 25–125)
Bilirubin, Total: 0.5

## 2021-02-06 LAB — IRON,TIBC AND FERRITIN PANEL: Ferritin: 88

## 2021-02-06 LAB — VITAMIN B12: Vitamin B-12: 507

## 2021-02-06 LAB — CBC: RBC: 4.81 (ref 3.87–5.11)

## 2021-02-06 LAB — TSH: TSH: 3.41 (ref 0.41–5.90)

## 2021-02-07 DIAGNOSIS — M47812 Spondylosis without myelopathy or radiculopathy, cervical region: Secondary | ICD-10-CM | POA: Diagnosis not present

## 2021-02-12 ENCOUNTER — Other Ambulatory Visit: Payer: Self-pay

## 2021-02-12 ENCOUNTER — Encounter (INDEPENDENT_AMBULATORY_CARE_PROVIDER_SITE_OTHER): Payer: Self-pay | Admitting: Family Medicine

## 2021-02-12 ENCOUNTER — Ambulatory Visit (INDEPENDENT_AMBULATORY_CARE_PROVIDER_SITE_OTHER): Payer: BC Managed Care – PPO | Admitting: Family Medicine

## 2021-02-12 ENCOUNTER — Encounter: Payer: BC Managed Care – PPO | Admitting: Gastroenterology

## 2021-02-12 VITALS — BP 143/73 | HR 98 | Temp 97.8°F | Ht 66.0 in | Wt 365.0 lb

## 2021-02-12 DIAGNOSIS — I10 Essential (primary) hypertension: Secondary | ICD-10-CM | POA: Diagnosis not present

## 2021-02-12 DIAGNOSIS — Z6841 Body Mass Index (BMI) 40.0 and over, adult: Secondary | ICD-10-CM | POA: Diagnosis not present

## 2021-02-12 DIAGNOSIS — R7303 Prediabetes: Secondary | ICD-10-CM

## 2021-02-12 NOTE — Progress Notes (Signed)
  Office: [phone]  /  Fax: [phone]    Date: Feb 26, 2021   Appointment Start Time: 2:00pm Duration: 30 minutes Provider: Lawerance Cruel, Psy.D. Type of Session: Individual Therapy  Location of Patient: Home Location of Provider: Provider's Home (private office) Type of Contact: Telepsychological Visit via MyChart Video Visit  Session Content: Laura Knox is a 55 y.o. female presenting for a follow-up appointment to address the previously established treatment goal of increasing coping skills. Today's appointment was a telepsychological visit due to COVID-19. Laura Knox provided verbal consent for today's telepsychological appointment and she is aware she is responsible for securing confidentiality on her end of the session. Prior to proceeding with today's appointment, Laura Knox's physical location at the time of this appointment was obtained as well a phone number she could be reached at in the event of technical difficulties. Laura Knox and this provider participated in today's telepsychological service.   This provider conducted a brief check-in. Laura Knox shared she has been busy, noting she continues to focus on increasing physical activity. She shared she also continues to have knee pain, noting a plan to seek a second opinion. Regarding eating, Laura Knox feels the last two weeks has not been "quite as good," adding she "celebrated" her weight loss and she has been out more. Nonetheless, she described making better choices when eating out. Laura Knox also discussed setting short-term goals for herself to help her get back on track. As such, psychoeducation regarding SMART goals was provided and Laura Knox was engaged in goal setting. She acknowledged dinner has been the most challenging; therefore, the following goal was established: Laura Knox will meet her calorie/protein goals and journal in [employer] for dinner at least 4 out of 7 days between now and the next appointment with this provider. Laura Knox was receptive to today's  appointment as evidenced by openness to sharing, responsiveness to feedback, and willingness to work toward the established SMART goal.  Mental Status Examination:  Appearance: well groomed and appropriate hygiene  Behavior: appropriate to circumstances Mood: euthymic Affect: mood congruent Speech: normal in rate, volume, and tone Eye Contact: appropriate Psychomotor Activity: unable to assess  Gait: unable to assess Thought Process: linear, logical, and goal directed  Thought Content/Perception: no hallucinations, delusions, bizarre thinking or behavior reported or observed and no evidence or endorsement of suicidal and homicidal ideation, plan, and intent Orientation: time, person, place, and purpose of appointment Memory/Concentration: memory, attention, language, and fund of knowledge intact  Insight/Judgment: fair  Interventions:  Conducted a brief chart review Provided empathic reflections and validation Employed supportive psychotherapy interventions to facilitate reduced distress and to improve coping skills with identified stressors Engaged patient in goal setting Psychoeducation provided regarding SMART goals  DSM-5 Diagnosis(es): F32.89 Other Specified Depressive Disorder, Emotional Eating Behaviors  Treatment Goal & Progress: During the initial appointment with this provider, the following treatment goal was established: increase coping skills. Laura Knox has demonstrated progress in her goal as evidenced by increased awareness of hunger patterns and increased awareness of triggers for emotional eating. Laura Knox also continues to demonstrate willingness to engage in learned skill(s).  Plan: The next appointment will be scheduled in three weeks, which will be via MyChart Video Visit. The next session will focus on working towards the established treatment goal.

## 2021-02-14 ENCOUNTER — Encounter: Payer: Self-pay | Admitting: Gastroenterology

## 2021-02-14 ENCOUNTER — Telehealth: Payer: Self-pay

## 2021-02-14 ENCOUNTER — Other Ambulatory Visit: Payer: Self-pay

## 2021-02-14 ENCOUNTER — Ambulatory Visit (INDEPENDENT_AMBULATORY_CARE_PROVIDER_SITE_OTHER): Payer: BC Managed Care – PPO | Admitting: Gastroenterology

## 2021-02-14 VITALS — BP 126/82 | HR 81 | Ht 66.5 in | Wt 372.4 lb

## 2021-02-14 DIAGNOSIS — Z1211 Encounter for screening for malignant neoplasm of colon: Secondary | ICD-10-CM | POA: Diagnosis not present

## 2021-02-14 NOTE — Patient Instructions (Addendum)
If you are age 56 or older, your body mass index should be between 23-30. Your Body mass index is 59.2 kg/m. If this is out of the aforementioned range listed, please consider follow up with your Primary Care Provider.  If you are age 14 or younger, your body mass index should be between 19-25. Your Body mass index is 59.2 kg/m. If this is out of the aformentioned range listed, please consider follow up with your Primary Care Provider.   I will be mailing/mychart your instructions.   Please call with any questions or concerns.  Thank you,  Dr. Lynann Bologna

## 2021-02-14 NOTE — Telephone Encounter (Signed)
LVM for patient to call back to go over her instructions for her upcoming procedure

## 2021-02-14 NOTE — Progress Notes (Signed)
Chief Complaint: For colonoscopy  Referring Provider:  Esperanza Richters, PA-C      ASSESSMENT AND PLAN;   #1.  Colorectal cancer screening  - Proceed with colonoscopy at [facility] (BMI>50) with 2 day prep.    Discussed risks & benefits. Risks including rare perforation req laparotomy, bleeding after bx/polypectomy req blood transfusion, rarely missing neoplasms, risks of anesthesia/sedation, rare risk of splenic trauma. Benefits outweigh the risks. Patient agrees to proceed. All the questions were answered. Consent forms given for review.    HPI:    Laura Knox is a 55 y.o. female  With OSA, obesity, prediabetes, HTN, asthma, kidney stones For colorectal cancer screening.  No nausea, vomiting, heartburn, regurgitation, odynophagia or dysphagia.  No significant diarrhea or constipation (better with miralax 17g qd). No melena or hematochezia. No unintentional weight loss. No abdominal pain.  No FH colon Ca/colonic polyps  Past Medical History:  Diagnosis Date  . B12 deficiency   . Bilateral swelling of feet and ankles   . Hypertension   . Joint pain   . Kidney stones   . Knee pain   . Osteoarthritis   . Other fatigue   . Prediabetes   . Sleep apnea   . Thyroid disease   . Vitamin D deficiency     Past Surgical History:  Procedure Laterality Date  . KIDNEY STONE SURGERY    . miniscus repair Left   . SEPTOPLASTY      Family History  Problem Relation Age of Onset  . Healthy Mother   . Obesity Mother   . Cancer Father   . Obesity Father   . Cancer - Other Father   . Stroke Maternal Grandmother   . Lung cancer Maternal Grandfather     Social History   Tobacco Use  . Smoking status: Never Smoker  . Smokeless tobacco: Never Used  Vaping Use  . Vaping Use: Never used  Substance Use Topics  . Alcohol use: Not Currently  . Drug use: Never    Current Outpatient Medications  Medication Sig Dispense Refill  . Cyanocobalamin (B-12) 1000 MCG TABS Take 1 tablet  by mouth daily.    Marland Kitchen estradiol (VIVELLE-DOT) 0.05 MG/24HR patch Place 1 patch onto the skin 2 (two) times a week.    . losartan (COZAAR) 100 MG tablet Take 1 tablet (100 mg total) by mouth daily. 90 tablet 3  . metFORMIN (GLUCOPHAGE) 500 MG tablet Take 1 tablet (500 mg total) by mouth daily with breakfast. 30 tablet 0  . Naproxen Sodium 220 MG CAPS Take 3 tablets by mouth 2 (two) times daily.    . progesterone (PROMETRIUM) 100 MG capsule Take by mouth.    . pyridOXINE (VITAMIN B-6) 25 MG tablet Take by mouth.    . thyroid (ARMOUR) 120 MG tablet Take 120 mg by mouth daily before breakfast.    . VITAMIN D, CHOLECALCIFEROL, PO Take 5,000 Units by mouth daily.     No current facility-administered medications for this visit.    No Known Allergies  Review of Systems:  Constitutional: Denies fever, chills, diaphoresis, appetite change and fatigue.  HEENT: Denies photophobia, eye pain, redness, hearing loss, ear pain, congestion, sore throat, rhinorrhea, sneezing, mouth sores, neck pain, neck stiffness and tinnitus.   Respiratory: Denies SOB, DOE, cough, chest tightness,  and wheezing.   Cardiovascular: Denies chest pain, palpitations and leg swelling.  Genitourinary: Denies dysuria, urgency, frequency, hematuria, flank pain and difficulty urinating.  Musculoskeletal: Denies myalgias, back pain, joint  swelling, arthralgias and gait problem.  Skin: No rash.  Neurological: Denies dizziness, seizures, syncope, weakness, light-headedness, numbness and headaches.  Hematological: Denies adenopathy. Easy bruising, personal or family bleeding history  Psychiatric/Behavioral: No anxiety or depression     Physical Exam:    BP 126/82 (BP Location: Right Arm, Patient Position: Sitting, Cuff Size: Normal)   Pulse 81   Ht 5' 6.5" (1.689 m)   Wt (!) 372 lb 6 oz (168.9 kg)   BMI 59.20 kg/m  Wt Readings from Last 3 Encounters:  02/14/21 (!) 372 lb 6 oz (168.9 kg)  02/12/21 (!) 365 lb (165.6 kg)   01/24/21 (!) 374 lb (169.6 kg)   Constitutional:  Well-developed, in no acute distress. Psychiatric: Normal mood and affect. Behavior is normal. HEENT: Pupils normal.  Conjunctivae are normal. No scleral icterus. Neck supple.  Cardiovascular: Normal rate, regular rhythm. No edema Pulmonary/chest: Effort normal and breath sounds normal. No wheezing, rales or rhonchi. Abdominal: Soft, nondistended. Nontender. Bowel sounds active throughout. There are no masses palpable. No hepatomegaly. Rectal: Deferred Neurological: Alert and oriented to person place and time. Skin: Skin is warm and dry. No rashes noted.  Data Reviewed: I have personally reviewed following labs and imaging studies  CBC: CBC Latest Ref Rng & Units 12/27/2020  WBC 3.4 - 10.8 x10E3/uL 8.1  Hemoglobin 11.1 - 15.9 g/dL 09.3  Hematocrit 81.8 - 46.6 % 41.2  Platelets 150 - 450 x10E3/uL 305    CMP: CMP Latest Ref Rng & Units 12/27/2020 12/20/2020  Glucose 65 - 99 mg/dL 299(B) 95  BUN 6 - 24 mg/dL 17 21  Creatinine 7.16 - 1.00 mg/dL 9.67 8.93  Sodium 810 - 144 mmol/L 145(H) 141  Potassium 3.5 - 5.2 mmol/L 4.5 4.3  Chloride 96 - 106 mmol/L 108(H) 104  CO2 20 - 29 mmol/L 18(L) 32  Calcium 8.7 - 10.2 mg/dL 8.9 8.9  Total Protein 6.0 - 8.5 g/dL 6.7 6.4  Total Bilirubin 0.0 - 1.2 mg/dL 0.4 0.4  Alkaline Phos 44 - 121 IU/L 109 100  AST 0 - 40 IU/L 18 12  ALT 0 - 32 IU/L 23 16      Radiology Studies: No results found.    Edman Circle, MD 02/14/2021, 9:03 AM  Cc: Esperanza Richters, PA-C

## 2021-02-15 ENCOUNTER — Encounter: Payer: Self-pay | Admitting: Gastroenterology

## 2021-02-15 DIAGNOSIS — Z23 Encounter for immunization: Secondary | ICD-10-CM | POA: Diagnosis not present

## 2021-02-18 NOTE — Progress Notes (Signed)
Chief Complaint:   OBESITY Laura Knox is here to discuss her progress with her obesity treatment plan along with follow-up of her obesity related diagnoses. Laura Knox is on the Category 4 Plan and keeping a food journal and adhering to recommended goals of 550-700 calories and 45 grams of protein at supper daily and states she is following her eating plan approximately 75% of the time. Laura Knox states she is doing Geophysicist/field seismologist, aerobics and water aerobics for 60 minutes 6 times per week.  Today's visit was #: 4 Starting weight: 378 lbs Starting date: 12/27/2020 Today's weight: 365 lbs Today's date: 02/12/2021 Total lbs lost to date: 13 Total lbs lost since last in-office visit: 9  Interim History: Laura Knox continues to do well with weight loss on her plan. She notes her hunger is mostly controlled and she is mindful of her food choices. She is staying active while watching the kids on Spring break.  Subjective:   1. Essential hypertension Laura Knox's blood pressure is mildly elevated today. She had her losartan increased recently and she isn't sure if her home blood pressure wrist cuff is accurate.  2. Pre-diabetes Laura Knox started metformin, and she denies nausea or vomiting. She is doing very well with weight loss.  Assessment/Plan:   1. Essential hypertension Laura Knox is to continue to work on weight loss to improve blood pressure control, and she will follow up with her primary care provider as instructed to see if her medications need to be adjusted. Will watch for signs of hypotension as she continues her lifestyle modifications.  2. Pre-diabetes Laura Knox will continue metformin, diet, and decreasing simple carbohydrates to help decrease the risk of diabetes. Will continue to monitor.  3. Obesity with current BMI of 59.0 Laura Knox is currently in the action stage of change. As such, her goal is to continue with weight loss efforts. She has agreed to the Category 4 Plan.   Exercise goals: As is, as  tolerated.  Behavioral modification strategies: meal planning and cooking strategies.  Laura Knox has agreed to follow-up with our clinic in 2 weeks. She was informed of the importance of frequent follow-up visits to maximize her success with intensive lifestyle modifications for her multiple health conditions.   Objective:   Blood pressure (!) 143/73, pulse 98, temperature 97.8 F (36.6 C), height 5\' 6"  (1.676 m), weight (!) 365 lb (165.6 kg), SpO2 96 %. Body mass index is 58.91 kg/m.  General: Cooperative, alert, well developed, in no acute distress. HEENT: Conjunctivae and lids unremarkable. Cardiovascular: Regular rhythm.  Lungs: Normal work of breathing. Neurologic: No focal deficits.   Lab Results  Component Value Date   CREATININE 0.69 12/27/2020   BUN 17 12/27/2020   NA 145 (H) 12/27/2020   K 4.5 12/27/2020   CL 108 (H) 12/27/2020   CO2 18 (L) 12/27/2020   Lab Results  Component Value Date   ALT 23 12/27/2020   AST 18 12/27/2020   ALKPHOS 109 12/27/2020   BILITOT 0.4 12/27/2020   Lab Results  Component Value Date   HGBA1C 6.0 (H) 12/27/2020   Lab Results  Component Value Date   INSULIN 20.4 12/27/2020   Lab Results  Component Value Date   TSH 0.75 12/20/2020   Lab Results  Component Value Date   CHOL 141 12/27/2020   HDL 44 12/27/2020   LDLCALC 81 12/27/2020   TRIG 80 12/27/2020   Lab Results  Component Value Date   WBC 8.1 12/27/2020   HGB 13.7 12/27/2020  HCT 41.2 12/27/2020   MCV 87 12/27/2020   PLT 305 12/27/2020   No results found for: IRON, TIBC, FERRITIN  Attestation Statements:   Reviewed by clinician on day of visit: allergies, medications, problem list, medical history, surgical history, family history, social history, and previous encounter notes.  Time spent on visit including pre-visit chart review and post-visit care and charting was 30 minutes.    I, Burt Knack, am acting as transcriptionist for Quillian Quince, MD.  I have  reviewed the above documentation for accuracy and completeness, and I agree with the above. -  Quillian Quince, MD

## 2021-02-19 NOTE — Telephone Encounter (Signed)
Patient returning your call in regards to instructions.

## 2021-02-19 NOTE — Telephone Encounter (Signed)
Went over instructions. Patient will call back with addition questions

## 2021-02-20 ENCOUNTER — Other Ambulatory Visit (INDEPENDENT_AMBULATORY_CARE_PROVIDER_SITE_OTHER): Payer: Self-pay | Admitting: Adult Health

## 2021-02-20 DIAGNOSIS — R7303 Prediabetes: Secondary | ICD-10-CM

## 2021-02-20 NOTE — Telephone Encounter (Signed)
Dr.Beasley 

## 2021-02-26 ENCOUNTER — Ambulatory Visit (INDEPENDENT_AMBULATORY_CARE_PROVIDER_SITE_OTHER): Payer: BC Managed Care – PPO | Admitting: Adult Health

## 2021-02-26 ENCOUNTER — Other Ambulatory Visit: Payer: Self-pay

## 2021-02-26 ENCOUNTER — Encounter (INDEPENDENT_AMBULATORY_CARE_PROVIDER_SITE_OTHER): Payer: Self-pay | Admitting: Adult Health

## 2021-02-26 ENCOUNTER — Telehealth (INDEPENDENT_AMBULATORY_CARE_PROVIDER_SITE_OTHER): Payer: BC Managed Care – PPO | Admitting: Psychology

## 2021-02-26 VITALS — BP 143/81 | HR 71 | Temp 98.0°F | Ht 66.0 in | Wt 366.0 lb

## 2021-02-26 DIAGNOSIS — R7303 Prediabetes: Secondary | ICD-10-CM | POA: Diagnosis not present

## 2021-02-26 DIAGNOSIS — I10 Essential (primary) hypertension: Secondary | ICD-10-CM | POA: Diagnosis not present

## 2021-02-26 DIAGNOSIS — F3289 Other specified depressive episodes: Secondary | ICD-10-CM

## 2021-02-26 DIAGNOSIS — Z9189 Other specified personal risk factors, not elsewhere classified: Secondary | ICD-10-CM | POA: Diagnosis not present

## 2021-02-26 DIAGNOSIS — Z6841 Body Mass Index (BMI) 40.0 and over, adult: Secondary | ICD-10-CM | POA: Diagnosis not present

## 2021-02-26 MED ORDER — METFORMIN HCL 500 MG PO TABS: 500.0000 mg | ORAL_TABLET | Freq: Every day | ORAL | 0 refills | Status: DC

## 2021-02-27 NOTE — Progress Notes (Signed)
Chief Complaint:   OBESITY Laura Knox is here to discuss her progress with her obesity treatment plan along with follow-up of her obesity related diagnoses. Laura Knox is on the Category 4 Plan and states she is following her eating plan approximately 70% of the time. Laura Knox states she is doing Silver Sneakers 60 minutes 6 times per week.  Today's visit was #: 5 Starting weight: 378 lbs Starting date: 12/27/2020 Today's weight: 366 lbs Today's date: 02/26/2021 Total lbs lost to date: 12 Total lbs lost since last in-office visit: 0  Interim History: Laura Knox has continued to enjoy regular exercise up to 6 days a week. She will adjust her exercise regime to accommodate for musculoskeletal pain. She is taking advantage of her early retirement and placing focus on her health and well being. She is up slightly since last OV- only 1 lb, however down a total of 12 lbs since beginning the program 12/27/20.  Subjective:   1. Pre-diabetes 12/27/2020 A1c 6.0 with elevated BG at 110 and elevated insulin level of 20.4. Laura Knox is on Metformin 500 mg QD and tolerating it well.  Lab Results  Component Value Date   HGBA1C 6.0 (H) 12/27/2020   Lab Results  Component Value Date   INSULIN 20.4 12/27/2020   2. Essential hypertension Laura Knox's BP is slightly elevated at OV. Ambulatory BP readings SBP 130-140 and DBP 70-80. She is on losartan 100 mg QD. Pt denies cardiac symptoms.  BP Readings from Last 3 Encounters:  02/26/21 (!) 143/81  02/14/21 126/82  02/12/21 (!) 143/73   3. At risk for diabetes mellitus Laura Knox is at higher than average risk for developing diabetes due to obesity, pre-diabetes, and hypertension.  Assessment/Plan:   1. Pre-diabetes Laura Knox will continue to work on weight loss, exercise, and decreasing simple carbohydrates to help decrease the risk of diabetes.   - metFORMIN (GLUCOPHAGE) 500 MG tablet; Take 1 tablet (500 mg total) by mouth daily with breakfast.  Dispense: 30 tablet; Refill:  0  2. Essential hypertension Laura Knox is working on healthy weight loss and exercise to improve blood pressure control. We will watch for signs of hypotension as she continues her lifestyle modifications. Check ambulatory BP and bring log to next OV.  3. At risk for diabetes mellitus Laura Knox was given approximately 15 minutes of diabetes education and counseling today. We discussed intensive lifestyle modifications today with an emphasis on weight loss as well as increasing exercise and decreasing simple carbohydrates in her diet. We also reviewed medication options with an emphasis on risk versus benefit of those discussed.   Repetitive spaced learning was employed today to elicit superior memory formation and behavioral change.  4. Class 3 severe obesity with body mass index (BMI) of 50.0 to 59.9 in adult, unspecified obesity type, unspecified whether serious comorbidity present Laura Knox)  Laura Knox is currently in the action stage of change. As such, her goal is to continue with weight loss efforts. She has agreed to the Category 4 Plan.   Exercise goals: As is  Behavioral modification strategies: increasing lean protein intake, increasing water intake, meal planning and cooking strategies and planning for success.  Laura Knox has agreed to follow-up with our clinic in 2 weeks. She was informed of the importance of frequent follow-up visits to maximize her success with intensive lifestyle modifications for her multiple health conditions.   Objective:   Blood pressure (!) 143/81, pulse 71, temperature 98 F (36.7 C), height 5\' 6"  (1.676 m), weight (!) 366 lb (166 kg),  SpO2 95 %. Body mass index is 59.07 kg/m.  General: Cooperative, alert, well developed, in no acute distress. HEENT: Conjunctivae and lids unremarkable. Cardiovascular: Regular rhythm.  Lungs: Normal work of breathing. Neurologic: No focal deficits.   Lab Results  Component Value Date   CREATININE 0.69 12/27/2020   BUN 17 12/27/2020    NA 145 (H) 12/27/2020   K 4.5 12/27/2020   CL 108 (H) 12/27/2020   CO2 18 (L) 12/27/2020   Lab Results  Component Value Date   ALT 23 12/27/2020   AST 18 12/27/2020   ALKPHOS 109 12/27/2020   BILITOT 0.4 12/27/2020   Lab Results  Component Value Date   HGBA1C 6.0 (H) 12/27/2020   Lab Results  Component Value Date   INSULIN 20.4 12/27/2020   Lab Results  Component Value Date   TSH 0.75 12/20/2020   Lab Results  Component Value Date   CHOL 141 12/27/2020   HDL 44 12/27/2020   LDLCALC 81 12/27/2020   TRIG 80 12/27/2020   Lab Results  Component Value Date   WBC 8.1 12/27/2020   HGB 13.7 12/27/2020   HCT 41.2 12/27/2020   MCV 87 12/27/2020   PLT 305 12/27/2020   No results found for: IRON, TIBC, FERRITIN  Attestation Statements:   Reviewed by clinician on day of visit: allergies, medications, problem list, medical history, surgical history, family history, social history, and previous encounter notes.  Edmund Hilda, am acting as Energy manager for William Hamburger, NP.  I have reviewed the above documentation for accuracy and completeness, and I agree with the above. -  Loretha Ure d. Jabbar Palmero, NP-C

## 2021-03-01 ENCOUNTER — Encounter: Payer: BC Managed Care – PPO | Admitting: Gastroenterology

## 2021-03-03 ENCOUNTER — Encounter: Payer: Self-pay | Admitting: Medical

## 2021-03-05 MED ORDER — THYROID 120 MG PO TABS: 120.0000 mg | ORAL_TABLET | Freq: Every day | ORAL | 1 refills | Status: DC

## 2021-03-06 NOTE — Progress Notes (Signed)
  Office: [phone]  /  Fax: [phone]    Date: Mar 20, 2021   Appointment Start Time: 2:01pm Duration: 34 minutes Provider: Glennie Isle, Psy.D. Type of Session: Individual Therapy  Location of Patient: Home Location of Provider: Provider's home (private office) Type of Contact: Telepsychological Visit via MyChart Video Visit  Session Content: Mertice is a 55 y.o. female presenting for a follow-up appointment to address the previously established treatment goal of increasing coping skills. Today's appointment was a telepsychological visit due to COVID-19. Meg provided verbal consent for today's telepsychological appointment and she is aware she is responsible for securing confidentiality on her end of the session. Prior to proceeding with today's appointment, Iyanni's physical location at the time of this appointment was obtained as well a phone number she could be reached at in the event of technical difficulties. Gelila and this provider participated in today's telepsychological service.   This provider conducted a brief check-in. Olivianna shared about recent events, noting reflection on recent weight loss. She feels the following has helped her with weight loss: increased awareness of nutrition and its impact on health; increased awareness of the impact of medical concerns on metabolism; and increased self-compassion. Reviewed previously established SMART goal for dinner. Cieara stated, "I met the goal." Positive reinforcement was provided. Colbie shared about her birthday plans as well as plans for her mother's birthday. Reviewed previously discussed strategies. Additionally, Feliz discussed she is focusing on making changes congruent to her meal plan (e.g., replacing dinner sides with a vegetable and ceasing sugar/Splenda use). To assist with frustration in the moment, psychoeducation provided regarding grounding. Engaged Memory in a grounding exercise (5-4-3-2-1). Konstantina provided verbal consent  during today's appointment for this provider to send a handout for today's exercise via e-mail. Furthermore, termination planning was discussed. Hadia was receptive to a follow-up appointment in 3-4 weeks and an additional follow-up/termination appointment in 3-4 weeks after that. Overall, Yashira was receptive to today's appointment as evidenced by openness to sharing, responsiveness to feedback, and willingness to implement discussed strategies .  Mental Status Examination:  Appearance: well groomed and appropriate hygiene  Behavior: appropriate to circumstances Mood: euthymic Affect: mood congruent Speech: normal in rate, volume, and tone Eye Contact: appropriate Psychomotor Activity: unable to assess  Gait: unable to assess Thought Process: linear, logical, and goal directed  Thought Content/Perception: no hallucinations, delusions, bizarre thinking or behavior reported or observed and no evidence or endorsement of suicidal and homicidal ideation, plan, and intent Orientation: time, person, place, and purpose of appointment Memory/Concentration: memory, attention, language, and fund of knowledge intact  Insight/Judgment: good  Interventions:  Conducted a brief chart review Provided empathic reflections and validation Reviewed content from the previous session Provided positive reinforcement Employed supportive psychotherapy interventions to facilitate reduced distress and to improve coping skills with identified stressors Discussed termination planning Psychoeducation provided regarding grounding Engaged patient in grounding exercise   DSM-5 Diagnosis(es): F32.89 Other Specified Depressive Disorder, Emotional Eating Behaviors  Treatment Goal & Progress: During the initial appointment with this provider, the following treatment goal was established: increase coping skills. Jalen has demonstrated progress in her goal as evidenced by increased awareness of hunger patterns and increased  awareness of triggers for emotional eating. Tattianna also continues to demonstrate willingness to engage in learned skill(s).  Plan: The next appointment will be scheduled in one month, which will be via MyChart Video Visit. The next session will focus on working towards the established treatment goal.

## 2021-03-12 ENCOUNTER — Ambulatory Visit (INDEPENDENT_AMBULATORY_CARE_PROVIDER_SITE_OTHER): Payer: BC Managed Care – PPO | Admitting: Family Medicine

## 2021-03-12 ENCOUNTER — Other Ambulatory Visit: Payer: Self-pay

## 2021-03-12 ENCOUNTER — Encounter (INDEPENDENT_AMBULATORY_CARE_PROVIDER_SITE_OTHER): Payer: Self-pay | Admitting: Family Medicine

## 2021-03-12 VITALS — BP 146/80 | HR 80 | Temp 97.5°F | Ht 66.0 in | Wt 365.0 lb

## 2021-03-12 DIAGNOSIS — Z6841 Body Mass Index (BMI) 40.0 and over, adult: Secondary | ICD-10-CM | POA: Diagnosis not present

## 2021-03-12 DIAGNOSIS — I10 Essential (primary) hypertension: Secondary | ICD-10-CM

## 2021-03-13 NOTE — Progress Notes (Signed)
Chief Complaint:   OBESITY Laura Knox is here to discuss her progress with her obesity treatment plan along with follow-up of her obesity related diagnoses. Laura Knox is on the Category 4 Plan and states she is following her eating plan approximately 65% of the time. Laura Knox states she is doing cardio, and Laura Knox sneakers for 60 minutes 6 times per week.  Today's visit was #: 6 Starting weight: 378 lbs Starting date: 12/27/2020 Today's weight: 365 lbs Today's date: 03/12/2021 Total lbs lost to date: 13 Total lbs lost since last in-office visit: 1  Interim History: Laura Knox continues to do well with weight loss. She has gotten a bit off track with her eating plan, especially for dinner and she is open to looking at more options.  Subjective:   1. Essential hypertension Laura Knox's blood pressure is elevated today. She is working on diet, exercise, and weight loss. She is on losartan and she denies chest pain or headache.  Assessment/Plan:   1. Essential hypertension Laura Knox will continue her medications and diet, and she may need to discuss with her primary care provider to adjust her medications.   2. Obesity with current BMI 59.0 Laura Knox is currently in the action stage of change. As such, her goal is to continue with weight loss efforts. She has agreed to the Category 4 Plan and keeping a food journal and adhering to recommended goals of 450-600 calories and 40+ grams of protein at supper daily.   Exercise goals: As is.  Behavioral modification strategies: increasing lean protein intake and meal planning and cooking strategies.  Laura Knox has agreed to follow-up with our clinic in 2 to 3 weeks. She was informed of the importance of frequent follow-up visits to maximize her success with intensive lifestyle modifications for her multiple health conditions.   Objective:   Blood pressure (!) 146/80, pulse 80, temperature (!) 97.5 F (36.4 C), height 5\' 6"  (1.676 m), weight (!) 365 lb (165.6 kg), SpO2  96 %. Body mass index is 58.91 kg/m.  General: Cooperative, alert, well developed, in no acute distress. HEENT: Conjunctivae and lids unremarkable. Cardiovascular: Regular rhythm.  Lungs: Normal work of breathing. Neurologic: No focal deficits.   Lab Results  Component Value Date   CREATININE 0.69 12/27/2020   BUN 17 12/27/2020   NA 145 (H) 12/27/2020   K 4.5 12/27/2020   CL 108 (H) 12/27/2020   CO2 18 (L) 12/27/2020   Lab Results  Component Value Date   ALT 23 12/27/2020   AST 18 12/27/2020   ALKPHOS 109 12/27/2020   BILITOT 0.4 12/27/2020   Lab Results  Component Value Date   HGBA1C 6.0 (H) 12/27/2020   Lab Results  Component Value Date   INSULIN 20.4 12/27/2020   Lab Results  Component Value Date   TSH 0.75 12/20/2020   Lab Results  Component Value Date   CHOL 141 12/27/2020   HDL 44 12/27/2020   LDLCALC 81 12/27/2020   TRIG 80 12/27/2020   Lab Results  Component Value Date   WBC 8.1 12/27/2020   HGB 13.7 12/27/2020   HCT 41.2 12/27/2020   MCV 87 12/27/2020   PLT 305 12/27/2020   No results found for: IRON, TIBC, FERRITIN  Attestation Statements:   Reviewed by clinician on day of visit: allergies, medications, problem list, medical history, surgical history, family history, social history, and previous encounter notes.  Time spent on visit including pre-visit chart review and post-visit care and charting was 32 minutes.  I, Trixie Dredge, am acting as transcriptionist for Dennard Nip, MD.  I have reviewed the above documentation for accuracy and completeness, and I agree with the above. -  Dennard Nip, MD

## 2021-03-15 DIAGNOSIS — Z1231 Encounter for screening mammogram for malignant neoplasm of breast: Secondary | ICD-10-CM | POA: Diagnosis not present

## 2021-03-19 DIAGNOSIS — N951 Menopausal and female climacteric states: Secondary | ICD-10-CM | POA: Diagnosis not present

## 2021-03-19 DIAGNOSIS — E039 Hypothyroidism, unspecified: Secondary | ICD-10-CM | POA: Diagnosis not present

## 2021-03-19 DIAGNOSIS — M2550 Pain in unspecified joint: Secondary | ICD-10-CM | POA: Diagnosis not present

## 2021-03-19 DIAGNOSIS — Z6841 Body Mass Index (BMI) 40.0 and over, adult: Secondary | ICD-10-CM | POA: Diagnosis not present

## 2021-03-20 ENCOUNTER — Telehealth (INDEPENDENT_AMBULATORY_CARE_PROVIDER_SITE_OTHER): Payer: BC Managed Care – PPO | Admitting: Psychology

## 2021-03-20 DIAGNOSIS — F3289 Other specified depressive episodes: Secondary | ICD-10-CM | POA: Diagnosis not present

## 2021-03-26 ENCOUNTER — Other Ambulatory Visit (INDEPENDENT_AMBULATORY_CARE_PROVIDER_SITE_OTHER): Payer: Self-pay | Admitting: Adult Health

## 2021-03-26 DIAGNOSIS — R7303 Prediabetes: Secondary | ICD-10-CM

## 2021-03-27 NOTE — Telephone Encounter (Signed)
Left pt msg.

## 2021-03-29 ENCOUNTER — Telehealth (INDEPENDENT_AMBULATORY_CARE_PROVIDER_SITE_OTHER): Payer: Self-pay | Admitting: Adult Health

## 2021-03-29 NOTE — Telephone Encounter (Signed)
Patient is returning Lisa's call regarding medication refill. She will need a refill on Metformin.

## 2021-04-02 ENCOUNTER — Encounter (INDEPENDENT_AMBULATORY_CARE_PROVIDER_SITE_OTHER): Payer: Self-pay | Admitting: Adult Health

## 2021-04-02 ENCOUNTER — Ambulatory Visit (INDEPENDENT_AMBULATORY_CARE_PROVIDER_SITE_OTHER): Payer: BC Managed Care – PPO | Admitting: Adult Health

## 2021-04-02 ENCOUNTER — Other Ambulatory Visit: Payer: Self-pay

## 2021-04-02 VITALS — BP 162/100 | HR 91 | Temp 97.9°F | Ht 66.0 in | Wt 362.0 lb

## 2021-04-02 DIAGNOSIS — I10 Essential (primary) hypertension: Secondary | ICD-10-CM

## 2021-04-02 DIAGNOSIS — Z6841 Body Mass Index (BMI) 40.0 and over, adult: Secondary | ICD-10-CM

## 2021-04-02 DIAGNOSIS — G4733 Obstructive sleep apnea (adult) (pediatric): Secondary | ICD-10-CM

## 2021-04-02 DIAGNOSIS — R7303 Prediabetes: Secondary | ICD-10-CM | POA: Diagnosis not present

## 2021-04-02 DIAGNOSIS — Z9189 Other specified personal risk factors, not elsewhere classified: Secondary | ICD-10-CM

## 2021-04-02 MED ORDER — METFORMIN HCL 500 MG PO TABS: 500.0000 mg | ORAL_TABLET | Freq: Every day | ORAL | 0 refills | Status: DC

## 2021-04-03 ENCOUNTER — Other Ambulatory Visit (INDEPENDENT_AMBULATORY_CARE_PROVIDER_SITE_OTHER): Payer: Self-pay

## 2021-04-03 NOTE — Progress Notes (Signed)
  Office: [phone]  /  Fax: [phone]    Date: April 17, 2021   Appointment Start Time: 2:03pm Duration: 30 minutes Provider: Lawerance Cruel, Psy.D. Type of Session: Individual Therapy  Location of Patient: Home Location of Provider: Provider's home (private office) Type of Contact: Telepsychological Visit via MyChart Video Visit  Session Content: This provider called Laura Knox at 2:02pm as she did not present for today's appointment. She stated she forgot to hit "join." As such, today's appointment was initiated 3 minutes late. Laura Knox is a 55 y.o. female presenting for a follow-up appointment to address the previously established treatment goal of increasing coping skills. Today's appointment was a telepsychological visit due to COVID-19. Jalan provided verbal consent for today's telepsychological appointment and she is aware she is responsible for securing confidentiality on her end of the session. Prior to proceeding with today's appointment, Laura Knox's physical location at the time of this appointment was obtained as well a phone number she could be reached at in the event of technical difficulties. Laura Knox and this provider participated in today's telepsychological service.   This provider conducted a brief check-in. Laura Knox shared about ongoing stressors and recent eating habits. She acknowledged some deviations; however, described making better choices and engaging in portion control. Laura Knox also stated she is working toward re-directing (e.g., going for a drive or going to the movies) when she has the urge to celebrate with food. Positive reinforcement was provided. Reviewed previous frustration, including engagement in the previously shared grounding exercise. She discussed she is at the point where she is trying to find new recipes and ways of cooking and adjusting previous recipes to fit her goals. Laura Knox was receptive to today's appointment as evidenced by openness to sharing, responsiveness to  feedback, and willingness to continue engaging in learned skills.  Mental Status Examination:  Appearance: well groomed and appropriate hygiene  Behavior: appropriate to circumstances Mood: euthymic Affect: mood congruent Speech: normal in rate, volume, and tone Eye Contact: appropriate Psychomotor Activity: unable to assess  Gait: unable to assess Thought Process: linear, logical, and goal directed  Thought Content/Perception: no hallucinations, delusions, bizarre thinking or behavior reported or observed and no evidence or endorsement of suicidal and homicidal ideation, plan, and intent Orientation: time, person, place, and purpose of appointment Memory/Concentration: memory, attention, language, and fund of knowledge intact  Insight/Judgment: good  Interventions:  Conducted a brief chart review Provided empathic reflections and validation Reviewed content from the previous session Provided positive reinforcement Employed supportive psychotherapy interventions to facilitate reduced distress and to improve coping skills with identified stressors Employed insight oriented and cognitive psychotherapy interventions to identify and modify anxiety/mood producing thoughts, beliefs, and negative self-appraisals contributing to distress and emotional eating  DSM-5 Diagnosis(es): F32.89 Other Specified Depressive Disorder, Emotional Eating Behaviors  Treatment Goal & Progress: During the initial appointment with this provider, the following treatment goal was established: increase coping skills. Laura Knox has demonstrated progress in her goal as evidenced by increased awareness of hunger patterns, increased awareness of triggers for emotional eating behaviors, and reduction in emotional eating behaviors . Laura Knox also continues to demonstrate willingness to engage in learned skill(s).  Plan: The next appointment will be scheduled in one month, which will be via MyChart Video Visit. The next session  will focus on working towards the established treatment goal and termination.

## 2021-04-04 DIAGNOSIS — G4733 Obstructive sleep apnea (adult) (pediatric): Secondary | ICD-10-CM | POA: Insufficient documentation

## 2021-04-04 NOTE — Progress Notes (Signed)
Chief Complaint:   OBESITY Laura Knox is here to discuss her progress with her obesity treatment plan along with follow-up of her obesity related diagnoses. Laura Knox is on the Category 4 Plan and keeping a food journal and adhering to recommended goals of 450-600 calories and 40 g protein and states she is following her eating plan approximately 50% of the time. Laura Knox states she is doing aerobics and water aerobics 60 minutes 6 times per week.  Today's visit was #: 7 Starting weight: 378 lbs Starting date: 12/27/2020 Today's weight: 362 lbs Today's date: 04/02/2021 Total lbs lost to date: 16 Total lbs lost since last in-office visit: 3  Interim History: Between Memorial Day holiday, out of town guests visiting, and celebrating her birthday,-Laura Knox is pleased with 3 lb weight loss. She is also being seen by [facility] and brought recent labs- reviewed with pt.  Subjective:   1. Pre-diabetes A1c on 12/27/2020 was 6.0 with elevated BG 110 and elevated insulin level 20.4. Laura Knox is on Metformin 500 mg QD and tolerating it well. 12/27/2020 CMP- stable.  2. Essential hypertension BP elevated at OV. Laura Knox denies CP/Dyspnea/palpitations.  She was started on losartan in spring 2022- increased to 100 mg QD.  She denies tobacco/vape use. Of Note: she has untreated OSA.  BP Readings from Last 3 Encounters:  04/02/21 (!) 162/100  03/12/21 (!) 146/80  02/26/21 (!) 143/81   3. OSA (obstructive sleep apnea) Laura Knox underwent a sleep study, with diagnosis of OSA. She is unable to tolerate any form of face mask on CPAP.  4. At risk for diabetes mellitus Laura Knox is at higher than average risk for developing diabetes due to obesity and pre-diabetes.  Assessment/Plan:   1. Pre-diabetes Laura Knox will continue to work on weight loss, exercise, and decreasing simple carbohydrates to help decrease the risk of diabetes.   - metFORMIN (GLUCOPHAGE) 500 MG tablet; Take 1 tablet (500 mg total) by mouth  daily with breakfast.  Dispense: 30 tablet; Refill: 0  2. Essential hypertension Laura Knox is working on healthy weight loss and exercise to improve blood pressure control. We will watch for signs of hypotension as she continues her lifestyle modifications. -Check ambulatory BP and take log to f/u. -Discuss uncontrolled HTN with PCP at f/u next week.  3. OSA (obstructive sleep apnea) Intensive lifestyle modifications are the first line treatment for this issue. We discussed several lifestyle modifications today and she will continue to work on diet, exercise and weight loss efforts. We will continue to monitor. Orders and follow up as documented in patient record.  -Continue with healthy eating and increase activity.  4. At risk for diabetes mellitus Laura Knox was given approximately 15 minutes of diabetes education and counseling today. We discussed intensive lifestyle modifications today with an emphasis on weight loss as well as increasing exercise and decreasing simple carbohydrates in her diet. We also reviewed medication options with an emphasis on risk versus benefit of those discussed.   Repetitive spaced learning was employed today to elicit superior memory formation and behavioral change.  5. Obesity with current BMI 58.6  Laura Knox is currently in the action stage of change. As such, her goal is to continue with weight loss efforts. She has agreed to the Category 4 Plan.   Exercise goals: As is  Behavioral modification strategies: increasing lean protein intake, decreasing simple carbohydrates, meal planning and cooking strategies, keeping healthy foods in the home and planning for success.  Laura Knox has agreed to follow-up with our  clinic in 3 weeks. She was informed of the importance of frequent follow-up visits to maximize her success with intensive lifestyle modifications for her multiple health conditions.   Objective:   Blood pressure (!) 162/100, pulse 91, temperature 97.9 F (36.6  C), height 5\' 6"  (1.676 m), weight (!) 362 lb (164.2 kg), SpO2 97 %. Body mass index is 58.43 kg/m.  General: Cooperative, alert, well developed, in no acute distress. HEENT: Conjunctivae and lids unremarkable. Cardiovascular: Regular rhythm.  Lungs: Normal work of breathing. Neurologic: No focal deficits.   Lab Results  Component Value Date   CREATININE 0.6 02/06/2021   BUN 21 02/06/2021   NA 135 (A) 02/06/2021   K 3.8 02/06/2021   CL 105 02/06/2021   CO2 29 (A) 02/06/2021   Lab Results  Component Value Date   ALT 26 02/06/2021   AST 19 02/06/2021   ALKPHOS 81 02/06/2021   BILITOT 0.4 12/27/2020   Lab Results  Component Value Date   HGBA1C 5.9 02/06/2021   HGBA1C 6.0 (H) 12/27/2020   Lab Results  Component Value Date   INSULIN 20.4 12/27/2020   Lab Results  Component Value Date   TSH 3.41 02/06/2021   Lab Results  Component Value Date   CHOL 141 12/27/2020   HDL 44 12/27/2020   LDLCALC 81 12/27/2020   TRIG 80 12/27/2020   Lab Results  Component Value Date   WBC 8.4 02/06/2021   HGB 13.8 02/06/2021   HCT 42 02/06/2021   MCV 87 12/27/2020   PLT 336 02/06/2021   Lab Results  Component Value Date   FERRITIN 88 02/06/2021     Attestation Statements:   Reviewed by clinician on day of visit: allergies, medications, problem list, medical history, surgical history, family history, social history, and previous encounter notes.  04/08/2021, CMA, am acting as transcriptionist for Edmund Hilda, NP.  I have reviewed the above documentation for accuracy and completeness, and I agree with the above. -  Laura Knox d. Laura Lipsett, NP-C

## 2021-04-06 ENCOUNTER — Other Ambulatory Visit (HOSPITAL_COMMUNITY)
Admission: RE | Admit: 2021-04-06 | Discharge: 2021-04-06 | Disposition: A | Discharge status: Home / Self Care | Payer: BC Managed Care – PPO | Source: Ambulatory Visit | Attending: Gastroenterology | Admitting: Gastroenterology

## 2021-04-06 ENCOUNTER — Other Ambulatory Visit: Payer: Self-pay

## 2021-04-06 ENCOUNTER — Encounter (HOSPITAL_COMMUNITY): Payer: Self-pay | Admitting: Gastroenterology

## 2021-04-06 DIAGNOSIS — Z01812 Encounter for preprocedural laboratory examination: Secondary | ICD-10-CM | POA: Diagnosis not present

## 2021-04-06 DIAGNOSIS — Z20822 Contact with and (suspected) exposure to covid-19: Secondary | ICD-10-CM | POA: Diagnosis not present

## 2021-04-06 LAB — SARS CORONAVIRUS 2 (TAT 6-24 HRS): SARS Coronavirus 2: NEGATIVE

## 2021-04-06 NOTE — Progress Notes (Signed)
Attempted to obtain medical history via telephone, unable to reach at this time. I left a voicemail to return pre surgical testing department's phone call.  

## 2021-04-09 NOTE — Anesthesia Preprocedure Evaluation (Addendum)
Anesthesia Evaluation  Patient identified by MRN, date of birth, ID band Patient awake    Reviewed: Allergy & Precautions, NPO status , Patient's Chart, lab work & pertinent test results  Airway Mallampati: II  TM Distance: >3 FB Neck ROM: Full    Dental no notable dental hx. (+) Teeth Intact, Dental Advisory Given   Pulmonary sleep apnea ,    Pulmonary exam normal breath sounds clear to auscultation       Cardiovascular hypertension, Pt. on medications Normal cardiovascular exam Rhythm:Regular Rate:Normal     Neuro/Psych negative neurological ROS  negative psych ROS   GI/Hepatic   Endo/Other  diabetes, Well Controlled, Type 2  Renal/GU      Musculoskeletal  (+) Arthritis ,   Abdominal (+) + obese,   Peds  Hematology   Anesthesia Other Findings   Reproductive/Obstetrics                            Anesthesia Physical Anesthesia Plan  ASA: 3  Anesthesia Plan: MAC   Post-op Pain Management:    Induction:   PONV Risk Score and Plan: Treatment may vary due to age or medical condition  Airway Management Planned: Natural Airway  Additional Equipment: None  Intra-op Plan:   Post-operative Plan:   Informed Consent: I have reviewed the patients History and Physical, chart, labs and discussed the procedure including the risks, benefits and alternatives for the proposed anesthesia with the patient or authorized representative who has indicated his/her understanding and acceptance.     Dental advisory given  Plan Discussed with:   Anesthesia Plan Comments: (Screening colonoscopy)       Anesthesia Quick Evaluation

## 2021-04-10 ENCOUNTER — Encounter (HOSPITAL_COMMUNITY)
Admission: RE | Disposition: A | Discharge status: Home / Self Care | Payer: Self-pay | Source: Home / Self Care | Attending: Gastroenterology

## 2021-04-10 ENCOUNTER — Other Ambulatory Visit: Payer: Self-pay

## 2021-04-10 ENCOUNTER — Encounter (HOSPITAL_COMMUNITY): Payer: Self-pay | Admitting: Gastroenterology

## 2021-04-10 ENCOUNTER — Ambulatory Visit (HOSPITAL_COMMUNITY): Payer: BC Managed Care – PPO | Admitting: Anesthesiology

## 2021-04-10 ENCOUNTER — Ambulatory Visit (HOSPITAL_COMMUNITY)
Admission: RE | Admit: 2021-04-10 | Discharge: 2021-04-10 | Disposition: A | Discharge status: Home / Self Care | Payer: BC Managed Care – PPO | Attending: Gastroenterology | Admitting: Gastroenterology

## 2021-04-10 DIAGNOSIS — K621 Rectal polyp: Secondary | ICD-10-CM | POA: Diagnosis not present

## 2021-04-10 DIAGNOSIS — Z1211 Encounter for screening for malignant neoplasm of colon: Secondary | ICD-10-CM | POA: Insufficient documentation

## 2021-04-10 DIAGNOSIS — K5730 Diverticulosis of large intestine without perforation or abscess without bleeding: Secondary | ICD-10-CM | POA: Diagnosis not present

## 2021-04-10 DIAGNOSIS — Z79899 Other long term (current) drug therapy: Secondary | ICD-10-CM | POA: Insufficient documentation

## 2021-04-10 DIAGNOSIS — Z79890 Hormone replacement therapy (postmenopausal): Secondary | ICD-10-CM | POA: Insufficient documentation

## 2021-04-10 DIAGNOSIS — I10 Essential (primary) hypertension: Secondary | ICD-10-CM | POA: Diagnosis not present

## 2021-04-10 DIAGNOSIS — G4733 Obstructive sleep apnea (adult) (pediatric): Secondary | ICD-10-CM | POA: Diagnosis not present

## 2021-04-10 DIAGNOSIS — K635 Polyp of colon: Secondary | ICD-10-CM | POA: Diagnosis not present

## 2021-04-10 DIAGNOSIS — Z809 Family history of malignant neoplasm, unspecified: Secondary | ICD-10-CM | POA: Diagnosis not present

## 2021-04-10 DIAGNOSIS — Z7984 Long term (current) use of oral hypoglycemic drugs: Secondary | ICD-10-CM | POA: Insufficient documentation

## 2021-04-10 DIAGNOSIS — R7303 Prediabetes: Secondary | ICD-10-CM | POA: Diagnosis not present

## 2021-04-10 DIAGNOSIS — K648 Other hemorrhoids: Secondary | ICD-10-CM | POA: Diagnosis not present

## 2021-04-10 DIAGNOSIS — Z87442 Personal history of urinary calculi: Secondary | ICD-10-CM | POA: Insufficient documentation

## 2021-04-10 HISTORY — PX: COLONOSCOPY WITH PROPOFOL: SHX5780

## 2021-04-10 HISTORY — PX: POLYPECTOMY: SHX5525

## 2021-04-10 LAB — GLUCOSE, CAPILLARY: Glucose-Capillary: 105 mg/dL — ABNORMAL HIGH (ref 70–99)

## 2021-04-10 SURGERY — COLONOSCOPY WITH PROPOFOL
Anesthesia: Monitor Anesthesia Care

## 2021-04-10 MED ORDER — LACTATED RINGERS IV SOLN
INTRAVENOUS | Status: AC | PRN
  Administered 2021-04-10: 1000 mL via INTRAVENOUS

## 2021-04-10 MED ORDER — SODIUM CHLORIDE 0.9 % IV SOLN: INTRAVENOUS | Status: DC

## 2021-04-10 MED ORDER — ONDANSETRON HCL 4 MG/2ML IJ SOLN
INTRAMUSCULAR | Status: DC | PRN
  Administered 2021-04-10: 4 mg via INTRAVENOUS

## 2021-04-10 MED ORDER — PROPOFOL 1000 MG/100ML IV EMUL
INTRAVENOUS | Status: AC
  Filled 2021-04-10: qty 100

## 2021-04-10 MED ORDER — PROPOFOL 500 MG/50ML IV EMUL
INTRAVENOUS | Status: DC | PRN
  Administered 2021-04-10: 120 ug/kg/min via INTRAVENOUS

## 2021-04-10 MED ORDER — PROPOFOL 500 MG/50ML IV EMUL
INTRAVENOUS | Status: AC
  Filled 2021-04-10: qty 50

## 2021-04-10 MED ORDER — LOSARTAN POTASSIUM 100 MG PO TABS: 100.0000 mg | ORAL_TABLET | Freq: Every day | ORAL | 2 refills | Status: DC

## 2021-04-10 MED ORDER — PROPOFOL 500 MG/50ML IV EMUL
INTRAVENOUS | Status: DC | PRN
  Administered 2021-04-10: 10 mg via INTRAVENOUS
  Administered 2021-04-10: 30 mg via INTRAVENOUS

## 2021-04-10 SURGICAL SUPPLY — 21 items

## 2021-04-10 NOTE — Transfer of Care (Signed)
Immediate Anesthesia Transfer of Care Note  Patient: Laura Knox  Procedure(s) Performed: COLONOSCOPY WITH PROPOFOL POLYPECTOMY  Patient Location: PACU  Anesthesia Type:MAC  Level of Consciousness: awake  Airway & Oxygen Therapy: Patient Spontanous Breathing  Post-op Assessment: Report given to RN and Post -op Vital signs reviewed and stable  Post vital signs: Reviewed and stable  Last Vitals:  Vitals Value Taken Time  BP 135/52 04/10/21 1124  Temp 36.6 C 04/10/21 1123  Pulse 78 04/10/21 1126  Resp 14 04/10/21 1126  SpO2 98 % 04/10/21 1126  Vitals shown include unvalidated device data.  Last Pain:  Vitals:   04/10/21 1123  TempSrc: Oral  PainSc: 0-No pain         Complications: No notable events documented.

## 2021-04-10 NOTE — H&P (Signed)
Chief Complaint: For colonoscopy   Referring Provider:  Esperanza Richters, PA-C        ASSESSMENT AND PLAN;    #1.  Colorectal cancer screening   - Proceed with colonoscopy at [facility] (BMI>50) with 2 day prep.     Discussed risks & benefits. Risks including rare perforation req laparotomy, bleeding after bx/polypectomy req blood transfusion, rarely missing neoplasms, risks of anesthesia/sedation, rare risk of splenic trauma. Benefits outweigh the risks. Patient agrees to proceed. All the questions were answered. Consent forms given for review.       HPI:     Laura Knox is a 55 y.o. female  With OSA, obesity, prediabetes, HTN, asthma, kidney stones For colorectal cancer screening.   No nausea, vomiting, heartburn, regurgitation, odynophagia or dysphagia.  No significant diarrhea or constipation (better with miralax 17g qd). No melena or hematochezia. No unintentional weight loss. No abdominal pain.   No FH colon Ca/colonic polyps       Past Medical History:  Diagnosis Date   B12 deficiency     Bilateral swelling of feet and ankles     Hypertension     Joint pain     Kidney stones     Knee pain     Osteoarthritis     Other fatigue     Prediabetes     Sleep apnea     Thyroid disease     Vitamin D deficiency             Past Surgical History:  Procedure Laterality Date   KIDNEY STONE SURGERY       miniscus repair Left     SEPTOPLASTY               Family History  Problem Relation Age of Onset   Healthy Mother     Obesity Mother     Cancer Father     Obesity Father     Cancer - Other Father     Stroke Maternal Grandmother     Lung cancer Maternal Grandfather        Social History        Tobacco Use   Smoking status: Never Smoker   Smokeless tobacco: Never Used  Vaping Use   Vaping Use: Never used  Substance Use Topics   Alcohol use: Not Currently   Drug use: Never            Current Outpatient Medications  Medication Sig Dispense Refill    Cyanocobalamin (B-12) 1000 MCG TABS Take 1 tablet by mouth daily.       estradiol (VIVELLE-DOT) 0.05 MG/24HR patch Place 1 patch onto the skin 2 (two) times a week.       losartan (COZAAR) 100 MG tablet Take 1 tablet (100 mg total) by mouth daily. 90 tablet 3   metFORMIN (GLUCOPHAGE) 500 MG tablet Take 1 tablet (500 mg total) by mouth daily with breakfast. 30 tablet 0   Naproxen Sodium 220 MG CAPS Take 3 tablets by mouth 2 (two) times daily.       progesterone (PROMETRIUM) 100 MG capsule Take by mouth.       pyridOXINE (VITAMIN B-6) 25 MG tablet Take by mouth.       thyroid (ARMOUR) 120 MG tablet Take 120 mg by mouth daily before breakfast.       VITAMIN D, CHOLECALCIFEROL, PO Take 5,000 Units by mouth daily.        No current facility-administered medications  for this visit.      No Known Allergies   Review of Systems: Constitutional: Denies fever, chills, diaphoresis, appetite change and fatigue. HEENT: Denies photophobia, eye pain, redness, hearing loss, ear pain, congestion, sore throat, rhinorrhea, sneezing, mouth sores, neck pain, neck stiffness and tinnitus.   Respiratory: Denies SOB, DOE, cough, chest tightness,  and wheezing.   Cardiovascular: Denies chest pain, palpitations and leg swelling. Genitourinary: Denies dysuria, urgency, frequency, hematuria, flank pain and difficulty urinating. Musculoskeletal: Denies myalgias, back pain, joint swelling, arthralgias and gait problem. Skin: No rash. Neurological: Denies dizziness, seizures, syncope, weakness, light-headedness, numbness and headaches. Hematological: Denies adenopathy. Easy bruising, personal or family bleeding history Psychiatric/Behavioral: No anxiety or depression       Physical Exam:     BP 126/82 (BP Location: Right Arm, Patient Position: Sitting, Cuff Size: Normal)   Pulse 81   Ht 5' 6.5" (1.689 m)   Wt (!) 372 lb 6 oz (168.9 kg)   BMI 59.20 kg/m     Wt Readings from Last 3 Encounters:  02/14/21 (!) 372  lb 6 oz (168.9 kg)  02/12/21 (!) 365 lb (165.6 kg)  01/24/21 (!) 374 lb (169.6 kg)    Constitutional:  Well-developed, in no acute distress. Psychiatric: Normal mood and affect. Behavior is normal. HEENT: Pupils normal.  Conjunctivae are normal. No scleral icterus. Neck supple. Cardiovascular: Normal rate, regular rhythm. No edema Pulmonary/chest: Effort normal and breath sounds normal. No wheezing, rales or rhonchi. Abdominal: Soft, nondistended. Nontender. Bowel sounds active throughout. There are no masses palpable. No hepatomegaly. Rectal: Deferred Neurological: Alert and oriented to person place and time. Skin: Skin is warm and dry. No rashes noted.   Data Reviewed: I have personally reviewed following labs and imaging studies   CBC: CBC Latest Ref Rng & Units 12/27/2020  WBC 3.4 - 10.8 x10E3/uL 8.1  Hemoglobin 11.1 - 15.9 g/dL 23.5  Hematocrit 57.3 - 46.6 % 41.2  Platelets 150 - 450 x10E3/uL 305      CMP: CMP Latest Ref Rng & Units 12/27/2020 12/20/2020  Glucose 65 - 99 mg/dL 220(U) 95  BUN 6 - 24 mg/dL 17 21  Creatinine 5.42 - 1.00 mg/dL 7.06 2.37  Sodium 628 - 144 mmol/L 145(H) 141  Potassium 3.5 - 5.2 mmol/L 4.5 4.3  Chloride 96 - 106 mmol/L 108(H) 104  CO2 20 - 29 mmol/L 18(L) 32  Calcium 8.7 - 10.2 mg/dL 8.9 8.9  Total Protein 6.0 - 8.5 g/dL 6.7 6.4  Total Bilirubin 0.0 - 1.2 mg/dL 0.4 0.4  Alkaline Phos 44 - 121 IU/L 109 100  AST 0 - 40 IU/L 18 12  ALT 0 - 32 IU/L 23 16          Radiology Studies: Imaging Results  No results found.         Edman Circle, MD

## 2021-04-10 NOTE — Discharge Instructions (Signed)

## 2021-04-10 NOTE — Op Note (Signed)
[facility] Patient Name: Laura Knox Procedure Date: 04/10/2021 MRN: [id number] Attending MD: Lynann Bologna , MD Date of Birth: 26-May-1966 CSN: [id number] Age: 55 Admit Type: Outpatient Procedure:                Colonoscopy Indications:              Screening for colorectal malignant neoplasm Providers:                Lynann Bologna, MD, Norman Clay, RN, Michele Mcalpine                            Technician Referring MD:              Medicines:                Monitored Anesthesia Care Complications:            No immediate complications. Estimated Blood Loss:     Estimated blood loss: none. Procedure:                Pre-Anesthesia Assessment:                           - Prior to the procedure, a History and Physical                            was performed, and patient medications and                            allergies were reviewed. The patient's tolerance of                            previous anesthesia was also reviewed. The risks                            and benefits of the procedure and the sedation                            options and risks were discussed with the patient.                            All questions were answered, and informed consent                            was obtained. Prior Anticoagulants: The patient has                            taken no previous anticoagulant or antiplatelet                            agents. ASA Grade Assessment: II - A patient with                            mild systemic disease. After reviewing the risks  and benefits, the patient was deemed in                            satisfactory condition to undergo the procedure.                           After obtaining informed consent, the colonoscope                            was passed under direct vision. Throughout the                            procedure, the patient's blood pressure, pulse, and                            oxygen saturations  were monitored continuously. The                            [id number]-[id number] ([id number] Olympus colonoscope was                            introduced through the anus and advanced to the the                            cecum, identified by appendiceal orifice and                            ileocecal valve. The colonoscopy was performed                            without difficulty. The patient tolerated the                            procedure well. The quality of the bowel                            preparation was good. The ileocecal valve,                            appendiceal orifice, and rectum were photographed. Scope In: 10:57:57 AM Scope Out: 11:17:18 AM Scope Withdrawal Time: 0 hours 10 minutes 40 seconds  Total Procedure Duration: 0 hours 19 minutes 21 seconds  Findings:      Two sessile polyps were found in the distal rectum. The polyps were 4 mm       in size. These polyps were removed with a cold biopsy forceps. Resection       and retrieval were complete.      A few (3-4) rare small-mouthed diverticula were found in the sigmoid       colon.      Non-bleeding internal hemorrhoids were found during retroflexion. The       hemorrhoids were small.      The exam was otherwise without abnormality on direct and retroflexion       views. Impression:               -  Two 4 mm polyps in the distal rectum, removed                            with a cold biopsy forceps. Resected and retrieved.                           - Minimal sigmoid diverticulosis.                           - Non-bleeding internal hemorrhoids.                           - The examination was otherwise normal on direct                            and retroflexion views. Moderate Sedation:      Not Applicable - Patient had care per Anesthesia. Recommendation:           - Patient has a contact number available for                            emergencies. The signs and symptoms of potential                            delayed  complications were discussed with the                            patient. Return to normal activities tomorrow.                            Written discharge instructions were provided to the                            patient.                           - High fiber diet.                           - Continue present medications.                           - Await pathology results.                           - Repeat colonoscopy for surveillance based on                            pathology results.                           - The findings and recommendations were discussed                            with the patient's family. Procedure Code(s):        --- Professional ---  84696, Colonoscopy, flexible; with biopsy, single                            or multiple Diagnosis Code(s):        --- Professional ---                           Z12.11, Encounter for screening for malignant                            neoplasm of colon                           K62.1, Rectal polyp                           K64.8, Other hemorrhoids                           K57.30, Diverticulosis of large intestine without                            perforation or abscess without bleeding CPT copyright 2019 [employer]. All rights reserved. The codes documented in this report are preliminary and upon coder review may  be revised to meet current compliance requirements. Lynann Bologna, MD 04/10/2021 11:22:25 AM This report has been signed electronically. Number of Addenda: 0

## 2021-04-10 NOTE — Anesthesia Postprocedure Evaluation (Signed)
Anesthesia Post Note  Patient: Laura Knox  Procedure(s) Performed: COLONOSCOPY WITH PROPOFOL POLYPECTOMY     Patient location during evaluation: Endoscopy Anesthesia Type: MAC Level of consciousness: awake and alert Pain management: pain level controlled Vital Signs Assessment: post-procedure vital signs reviewed and stable Respiratory status: spontaneous breathing, nonlabored ventilation, respiratory function stable and patient connected to nasal cannula oxygen Cardiovascular status: blood pressure returned to baseline and stable Postop Assessment: no apparent nausea or vomiting Anesthetic complications: no   No notable events documented.  Last Vitals:  Vitals:   04/10/21 1140 04/10/21 1150  BP: (!) 159/79 136/61  Pulse: 63 67  Resp: 20 12  Temp:    SpO2: 95% 96%    Last Pain:  Vitals:   04/10/21 1150  TempSrc:   PainSc: 0-No pain                 Barnet Glasgow

## 2021-04-11 LAB — SURGICAL PATHOLOGY

## 2021-04-12 ENCOUNTER — Encounter (HOSPITAL_COMMUNITY): Payer: Self-pay | Admitting: Gastroenterology

## 2021-04-12 ENCOUNTER — Encounter: Payer: Self-pay | Admitting: Gastroenterology

## 2021-04-13 ENCOUNTER — Other Ambulatory Visit: Payer: Self-pay

## 2021-04-13 ENCOUNTER — Ambulatory Visit (HOSPITAL_BASED_OUTPATIENT_CLINIC_OR_DEPARTMENT_OTHER)
Admission: RE | Admit: 2021-04-13 | Discharge: 2021-04-13 | Disposition: A | Discharge status: Home / Self Care | Payer: BC Managed Care – PPO | Source: Ambulatory Visit | Attending: Medical | Admitting: Medical

## 2021-04-13 ENCOUNTER — Encounter: Payer: Self-pay | Admitting: Medical

## 2021-04-13 ENCOUNTER — Ambulatory Visit (INDEPENDENT_AMBULATORY_CARE_PROVIDER_SITE_OTHER): Payer: BC Managed Care – PPO | Admitting: Medical

## 2021-04-13 VITALS — BP 140/80 | HR 86 | Ht 67.0 in | Wt 366.0 lb

## 2021-04-13 DIAGNOSIS — M2550 Pain in unspecified joint: Secondary | ICD-10-CM | POA: Insufficient documentation

## 2021-04-13 DIAGNOSIS — M79641 Pain in right hand: Secondary | ICD-10-CM | POA: Diagnosis not present

## 2021-04-13 DIAGNOSIS — I10 Essential (primary) hypertension: Secondary | ICD-10-CM | POA: Diagnosis not present

## 2021-04-13 MED ORDER — HYDROCHLOROTHIAZIDE 12.5 MG PO CAPS: 12.5000 mg | ORAL_CAPSULE | Freq: Every day | ORAL | 0 refills | Status: DC

## 2021-04-13 NOTE — Patient Instructions (Signed)
Your bp is borderline high today and higher at home. You did mention some alleve use for knee pain. Recommend that you hold off on any nsaids presently due to effect on bp.  Continue losartan 100 mg daily and adding low dose hctz 12.5 mg daily.   For hand pain arthralgias get inflammatory labs and hand xray.  If bp is tightly controlled in future may be able to use low dose ibuprofen 200 mg with tylenol. But presently just use tylenol.  Follow up 1 month or as needed

## 2021-04-13 NOTE — Progress Notes (Signed)
Subjective:    Patient ID: Laura Knox, female    DOB: 19-Oct-1966, 55 y.o.   MRN: [id number]  HPI  Pt in for bp level that have varied.  Levels 136/75, 158/106, 158/102, 136/80 and 134/81. Pt has new wrist bp cuff. Pt states makes sure seated for 5 minutes and hse states cuff at arm level.    Pt is taking losartan 100 daily.  Today bp is 138/80  140/80 when I checked manual.  Hand pain for years mild Recently pain is moderate to severe with swelling over past 2 months.  Pt has been using some alleve periodically for knee pain.    Review of Systems  Constitutional:  Negative for chills, fatigue and fever.  Respiratory:  Negative for cough, choking, shortness of breath and wheezing.   Cardiovascular:  Negative for chest pain and palpitations.  Gastrointestinal:  Negative for abdominal pain.  Genitourinary:  Negative for difficulty urinating, dysuria, flank pain, frequency and genital sores.  Musculoskeletal:  Positive for arthralgias.  Neurological:  Negative for dizziness, speech difficulty, weakness, numbness and headaches.    Past Medical History:  Diagnosis Date   B12 deficiency    Bilateral swelling of feet and ankles    Hypertension    Joint pain    Kidney stones    Knee pain    Osteoarthritis    Other fatigue    Prediabetes    Sleep apnea    Thyroid disease    Vitamin D deficiency      Social History   Socioeconomic History   Marital status: Married    Spouse name: Greig Castilla   Number of children: 0   Years of education: Not on file   Highest education level: Not on file  Occupational History   Occupation: Retired  Tobacco Use   Smoking status: Never   Smokeless tobacco: Never  Vaping Use   Vaping Use: Never used  Substance and Sexual Activity   Alcohol use: Not Currently   Drug use: Never   Sexual activity: Yes    Partners: Male  Other Topics Concern   Not on file  Social History Narrative   Not on file   Social Determinants of Health    Financial Resource Strain: Not on file  Food Insecurity: Not on file  Transportation Needs: Not on file  Physical Activity: Not on file  Stress: Not on file  Social Connections: Not on file  Intimate Partner Violence: Not on file    Past Surgical History:  Procedure Laterality Date   COLONOSCOPY WITH PROPOFOL N/A 04/10/2021   Procedure: COLONOSCOPY WITH PROPOFOL;  Surgeon: Lynann Bologna, MD;  Location: [facility];  Service: Endoscopy;  Laterality: N/A;   KIDNEY STONE SURGERY     miniscus repair Left    POLYPECTOMY  04/10/2021   Procedure: POLYPECTOMY;  Surgeon: Lynann Bologna, MD;  Location: [facility];  Service: Endoscopy;;   SEPTOPLASTY      Family History  Problem Relation Age of Onset   Healthy Mother    Obesity Mother    Cancer Father    Obesity Father    Cancer - Other Father    Stroke Maternal Grandmother    Lung cancer Maternal Grandfather     No Known Allergies  Current Outpatient Medications on File Prior to Visit  Medication Sig Dispense Refill   b complex vitamins capsule Take 1 capsule by mouth 2 (two) times daily.     cetirizine (ZYRTEC) 10 MG tablet Take 10 mg by  mouth daily.     estradiol (VIVELLE-DOT) 0.1 MG/24HR patch Place 1 patch onto the skin 2 (two) times a week.     losartan (COZAAR) 100 MG tablet Take 1 tablet (100 mg total) by mouth at bedtime. 30 tablet 2   metFORMIN (GLUCOPHAGE) 500 MG tablet Take 1 tablet (500 mg total) by mouth daily with breakfast. 30 tablet 0   Naproxen Sodium 220 MG CAPS Take 880 mg by mouth 2 (two) times daily. Aleve     Omega 3 1000 MG CAPS Take 2,000 mg by mouth 2 (two) times daily. Pro Omega     Pregnenolone Micronized (PREGNENOLONE PO) Take 150 mg by mouth daily.     PRESCRIPTION MEDICATION Apply 0.5 mLs topically daily. TESTOSTERONE 2% CREAM Apply 1/2 mL (2 clicks)   behind alternating knees     progesterone (PROMETRIUM) 100 MG capsule Take 200 mg by mouth at bedtime.     pyridOXINE (VITAMIN B-6) 25 MG tablet  Take 25 mg by mouth 3 (three) times a week. evening     thyroid (ARMOUR) 120 MG tablet Take 1 tablet (120 mg total) by mouth daily before breakfast. 90 tablet 1   VITAMIN D, CHOLECALCIFEROL, PO Take 5,000 Units by mouth at bedtime.     Zinc 30 MG CAPS Take 30 mg by mouth at bedtime.     No current facility-administered medications on file prior to visit.    BP 140/80 Comment: 138/80 manual  Pulse 86   Ht 5\' 7"  (1.702 m)   Wt (!) 366 lb (166 kg)   SpO2 95%   BMI 57.32 kg/m        Objective:   Physical Exam  General- No acute distress. Pleasant patient. Neck- Full range of motion, no jvd Lungs- Clear, even and unlabored. Heart- regular rate and rhythm. Neurologic- CNII- XII grossly intact.  Bilateral hands- appear swollen and mild tender on flexion and extension of digits.     Assessment & Plan:   Your bp is borderline high today and higher at home. You did mention some alleve use for knee pain. Recommend that you hold off on any nsaids presently due to effect on bp.  Continue losartan 100 mg daily and adding low dose hctz 12.5 mg daily.   For hand pain arthralgias get inflammatory labs and hand xray.  If bp is tightly controlled in future may be able to use low dose ibuprofen 200 mg with tylenol. But presently just use tylenol.  Follow up 1 month or as needed

## 2021-04-16 ENCOUNTER — Other Ambulatory Visit: Payer: Self-pay

## 2021-04-16 ENCOUNTER — Encounter (INDEPENDENT_AMBULATORY_CARE_PROVIDER_SITE_OTHER): Payer: Self-pay | Admitting: Family Medicine

## 2021-04-16 ENCOUNTER — Ambulatory Visit (INDEPENDENT_AMBULATORY_CARE_PROVIDER_SITE_OTHER): Payer: BC Managed Care – PPO | Admitting: Family Medicine

## 2021-04-16 VITALS — BP 145/80 | HR 76 | Temp 97.6°F | Ht 67.0 in | Wt 363.0 lb

## 2021-04-16 DIAGNOSIS — I10 Essential (primary) hypertension: Secondary | ICD-10-CM

## 2021-04-16 DIAGNOSIS — Z6841 Body Mass Index (BMI) 40.0 and over, adult: Secondary | ICD-10-CM

## 2021-04-16 LAB — SEDIMENTATION RATE: Sed Rate: 28 mm/h (ref 0–30)

## 2021-04-16 LAB — URIC ACID: Uric Acid, Serum: 4.5 mg/dL (ref 2.5–7.0)

## 2021-04-16 LAB — ANA: Anti Nuclear Antibody (ANA): NEGATIVE

## 2021-04-16 LAB — C-REACTIVE PROTEIN: CRP: 17.4 mg/L — ABNORMAL HIGH (ref <8.0)

## 2021-04-16 LAB — RHEUMATOID FACTOR: Rheumatoid fact SerPl-aCnc: <14 IU/mL (ref <14)

## 2021-04-16 NOTE — Addendum Note (Signed)
Addended by: Gwenevere Abbot on: 04/16/2021 06:37 PM   Modules accepted: Orders

## 2021-04-17 ENCOUNTER — Telehealth (INDEPENDENT_AMBULATORY_CARE_PROVIDER_SITE_OTHER): Payer: BC Managed Care – PPO | Admitting: Psychology

## 2021-04-17 DIAGNOSIS — F3289 Other specified depressive episodes: Secondary | ICD-10-CM | POA: Diagnosis not present

## 2021-04-17 NOTE — Progress Notes (Signed)
Chief Complaint:   OBESITY Laura Knox is here to discuss her progress with her obesity treatment plan along with follow-up of her obesity related diagnoses. Laura Knox is on the Category 4 Plan and states she is following her eating plan approximately 60% of the time. Laura Knox states she is doing Geophysicist/field seismologist and water aerobics for 60 minutes 6 times per week.  Today's visit was #: 8 Starting weight: 378 lbs Starting date: 12/27/2020 Today's weight: 363 lbs Today's date: 04/16/2021 Total lbs lost to date: 15 Total lbs lost since last in-office visit: 3  Interim History: Laura Knox continues to do well with weight loss. She notes her hunger has increased after her colonoscopy. She recognizes when she goes too long without eating she makes worse eating choices.  Subjective:   1. Essential hypertension Laura Knox's blood pressure is borderline elevated. She just started hydrochlorothiazide yesterday.  Assessment/Plan:   1. Essential hypertension Laura Knox will continue diet and exercise to improve blood pressure control. We will watch for signs of hypotension as she continues her lifestyle modifications.  2. Obesity with current BMI 57 Laura Knox is currently in the action stage of change. As such, her goal is to continue with weight loss efforts. She has agreed to the Category 4 Plan and keeping a food journal and adhering to recommended goals of 500-700 calories and 45 grams of protein at supper daily.   Exercise goals: As is.  Behavioral modification strategies: increasing lean protein intake.  Laura Knox has agreed to follow-up with our clinic in 3 weeks. She was informed of the importance of frequent follow-up visits to maximize her success with intensive lifestyle modifications for her multiple health conditions.   Objective:   Blood pressure (!) 145/80, pulse 76, temperature 97.6 F (36.4 C), height 5\' 7"  (1.702 m), weight (!) 363 lb (164.7 kg), SpO2 98 %. Body mass index is 56.85 kg/m.  General:  Cooperative, alert, well developed, in no acute distress. HEENT: Conjunctivae and lids unremarkable. Cardiovascular: Regular rhythm.  Lungs: Normal work of breathing. Neurologic: No focal deficits.   Lab Results  Component Value Date   CREATININE 0.6 02/06/2021   BUN 21 02/06/2021   NA 135 (A) 02/06/2021   K 3.8 02/06/2021   CL 105 02/06/2021   CO2 29 (A) 02/06/2021   Lab Results  Component Value Date   ALT 26 02/06/2021   AST 19 02/06/2021   ALKPHOS 81 02/06/2021   BILITOT 0.4 12/27/2020   Lab Results  Component Value Date   HGBA1C 5.9 02/06/2021   HGBA1C 6.0 (H) 12/27/2020   Lab Results  Component Value Date   INSULIN 20.4 12/27/2020   Lab Results  Component Value Date   TSH 3.41 02/06/2021   Lab Results  Component Value Date   CHOL 141 12/27/2020   HDL 44 12/27/2020   LDLCALC 81 12/27/2020   TRIG 80 12/27/2020   Lab Results  Component Value Date   WBC 8.4 02/06/2021   HGB 13.8 02/06/2021   HCT 42 02/06/2021   MCV 87 12/27/2020   PLT 336 02/06/2021   Lab Results  Component Value Date   FERRITIN 88 02/06/2021   Attestation Statements:   Reviewed by clinician on day of visit: allergies, medications, problem list, medical history, surgical history, family history, social history, and previous encounter notes.  Time spent on visit including pre-visit chart review and post-visit care and charting was 30 minutes.    04/08/2021, am acting as transcriptionist for Trude Mcburney, MD.  I have reviewed the above documentation for accuracy and completeness, and I agree with the above. -  Dennard Nip, MD

## 2021-04-27 ENCOUNTER — Other Ambulatory Visit (INDEPENDENT_AMBULATORY_CARE_PROVIDER_SITE_OTHER): Payer: Self-pay | Admitting: Adult Health

## 2021-04-27 DIAGNOSIS — R7303 Prediabetes: Secondary | ICD-10-CM

## 2021-05-01 NOTE — Progress Notes (Unsigned)
Office: [phone]  /  Fax: [phone]    Date: May 15, 2021   Appointment Start Time: *** Duration: *** minutes Provider: Lawerance Cruel, Psy.D. Type of Session: Individual Therapy  Location of Patient: {gbptloc:23249} Location of Provider: Provider's home (private office) Type of Contact: Telepsychological Visit via MyChart Video Visit  Session Content: Laura Knox is a 55 y.o. female presenting for a follow-up appointment to address the previously established treatment goal of increasing coping skills. Today's appointment was a telepsychological visit due to COVID-19. [name] provided verbal consent for today's telepsychological appointment and she is aware she is responsible for securing confidentiality on her end of the session. Prior to proceeding with today's appointment, [name]'s physical location at the time of this appointment was obtained as well a phone number she could be reached at in the event of technical difficulties. [name] and this provider participated in today's telepsychological service.   This provider conducted a brief check-in and verbally administered the PHQ-9 and GAD-7. *** [name] was receptive to today's appointment as evidenced by openness to sharing, responsiveness to feedback, and {gbreceptiveness:23401}.  Mental Status Examination:  Appearance: {Appearance:22431} Behavior: {Behavior:22445} Mood: {gbmood:21757} Affect: {Affect:22436} Speech: {Speech:22432} Eye Contact: {Eye Contact:22433} Psychomotor Activity: unable to assess Gait: unable to assess Thought Process: {thought process:22448}  Thought Content/Perception: {disturbances:22451} Orientation: {Orientation:22437} Memory/Concentration: {gbcognition:22449} Insight/Judgment: {Insight:22446}  Structured Assessments Results: The Patient Health Questionnaire-9 (PHQ-9) is a self-report measure that assesses symptoms and severity of depression over the course of the last two weeks. [name] obtained a score of  *** suggesting {GBPHQ9SEVERITY:21752}. [name] finds the endorsed symptoms to be {gbphq9difficulty:21754}. [0= Not at all; 1= Several days; 2= More than half the days; 3= Nearly every day] Little interest or pleasure in doing things ***  Feeling down, depressed, or hopeless ***  Trouble falling or staying asleep, or sleeping too much ***  Feeling tired or having little energy ***  Poor appetite or overeating ***  Feeling bad about yourself --- or that you are a failure or have let yourself or your family down ***  Trouble concentrating on things, such as reading the newspaper or watching television ***  Moving or speaking so slowly that other people could have noticed? Or the opposite --- being so fidgety or restless that you have been moving around a lot more than usual ***  Thoughts that you would be better off dead or hurting yourself in some way ***  PHQ-9 Score ***    The Generalized Anxiety Disorder-7 (GAD-7) is a brief self-report measure that assesses symptoms of anxiety over the course of the last two weeks. [name] obtained a score of *** suggesting {gbgad7severity:21753}. [name] finds the endorsed symptoms to be {gbphq9difficulty:21754}. [0= Not at all; 1= Several days; 2= Over half the days; 3= Nearly every day] Feeling nervous, anxious, on edge ***  Not being able to stop or control worrying ***  Worrying too much about different things ***  Trouble relaxing ***  Being so restless that it's hard to sit still ***  Becoming easily annoyed or irritable ***  Feeling afraid as if something awful might happen ***  GAD-7 Score ***   Interventions:  {Interventions:22172}  DSM-5 Diagnosis(es): F32.89 Other Specified Depressive Disorder, Emotional Eating Behaviors  Treatment Goal & Progress: During the initial appointment with this provider, the following treatment goal was established: increase coping skills. [name] has demonstrated progress in her goal as evidenced by  {gbtxprogress:22839}. [name] also {gbtxprogress2:22951}.  Plan: The next appointment will be scheduled in {gbweeks:21758}, which will  be {gbtxmodality:23402}. The next session will focus on {Plan for Next Appointment:23400}.

## 2021-05-01 NOTE — Telephone Encounter (Signed)
Pt last seen by Dr. Beasley.  

## 2021-05-06 ENCOUNTER — Other Ambulatory Visit: Payer: Self-pay | Admitting: Medical

## 2021-05-06 ENCOUNTER — Other Ambulatory Visit: Payer: Self-pay | Admitting: Gastroenterology

## 2021-05-09 ENCOUNTER — Other Ambulatory Visit: Payer: Self-pay

## 2021-05-09 ENCOUNTER — Ambulatory Visit (INDEPENDENT_AMBULATORY_CARE_PROVIDER_SITE_OTHER): Payer: BC Managed Care – PPO | Admitting: Family Medicine

## 2021-05-09 VITALS — BP 140/70 | HR 82 | Temp 98.3°F | Ht 67.0 in | Wt 362.0 lb

## 2021-05-09 DIAGNOSIS — R7303 Prediabetes: Secondary | ICD-10-CM

## 2021-05-09 DIAGNOSIS — Z9189 Other specified personal risk factors, not elsewhere classified: Secondary | ICD-10-CM | POA: Diagnosis not present

## 2021-05-09 DIAGNOSIS — I10 Essential (primary) hypertension: Secondary | ICD-10-CM

## 2021-05-09 DIAGNOSIS — Z6841 Body Mass Index (BMI) 40.0 and over, adult: Secondary | ICD-10-CM

## 2021-05-09 MED ORDER — METFORMIN HCL 500 MG PO TABS: ORAL_TABLET | ORAL | 0 refills | Status: DC

## 2021-05-15 ENCOUNTER — Telehealth (INDEPENDENT_AMBULATORY_CARE_PROVIDER_SITE_OTHER): Payer: BC Managed Care – PPO | Admitting: Psychology

## 2021-05-15 NOTE — Progress Notes (Signed)
  Office: [phone]  /  Fax: [phone]    Date: May 29, 2021   Appointment Start Time: 2:32pm Duration: 29 minutes Provider: Lawerance Cruel, Psy.D. Type of Session: Individual Therapy  Location of Patient: Home Location of Provider: Provider's home (private office) Type of Contact: Telepsychological Visit via MyChart Video Visit  Session Content: Laura Knox is a 55 y.o. female presenting for a follow-up appointment to address the previously established treatment goal of increasing coping skills. Today's appointment was a telepsychological visit due to COVID-19. Laura Knox provided verbal consent for today's telepsychological appointment and she is aware she is responsible for securing confidentiality on her end of the session. Prior to proceeding with today's appointment, Laura Knox's physical location at the time of this appointment was obtained as well a phone number she could be reached at in the event of technical difficulties. Laura Knox and this provider participated in today's telepsychological service. Of note, today's appointment was switched to a regular telephone call at 2:35pm with Laura Knox's verbal consent due to technical issues.   This provider conducted a brief check-in. Laura Knox shared about recent events, including her niece visiting in July and multiple celebrations. She shared she continues to lose weight and denied challenges with her eating habits. A plan was developed to help Laura Knox cope with emotional eating in the future using learned skills. She wrote down the following plan: focus on hydration; be prepared with snacks congruent to the meal plan; pause to ask questions when triggered to eat (e.g., Am I really hungry?, Is there something bothering me?, and Will I feel better if I eat?); and engage in discussed coping strategies after going through the aforementioned questions (e.g., engaging in self-talk). Laura Knox was receptive to today's appointment as evidenced by openness to sharing,  responsiveness to feedback, and willingness to continue engaging in learned skills.  Mental Status Examination:  Appearance: well groomed and appropriate hygiene  Behavior: appropriate to circumstances Mood: euthymic Affect: mood congruent Speech: normal in rate, volume, and tone Eye Contact: appropriate Psychomotor Activity: unable to assess Gait: unable to assess Thought Process: linear, logical, and goal directed  Thought Content/Perception: no hallucinations, delusions, bizarre thinking or behavior reported or observed and no evidence or endorsement of suicidal and homicidal ideation, plan, and intent Orientation: time, person, place, and purpose of appointment Memory/Concentration: memory, attention, language, and fund of knowledge intact  Insight/Judgment: good  Interventions:  Conducted a brief chart review Provided empathic reflections and validation Processed thoughts and feelings Reviewed learned skills Employed supportive psychotherapy interventions to facilitate reduced distress, and to improve coping skills with identified stressors  DSM-5 Diagnosis(es): F32.89 Other Specified Depressive Disorder, Emotional Eating Behaviors  Treatment Goal & Progress: During the initial appointment with this provider, the following treatment goal was established: increase coping skills. Laura Knox demonstrated progress in her goal as evidenced by increased awareness of hunger patterns, increased awareness of triggers for emotional eating behaviors, and reduction in emotional eating behaviors . Laura Knox also continues to demonstrate willingness to engage in learned skill(s).  Plan: As previously planned, today was Laura Knox's last appointment with this provider. She acknowledged understanding that she may request a follow-up appointment with this provider in the future as long as she is still established with the clinic. No further follow-up planned by this provider.

## 2021-05-22 NOTE — Progress Notes (Signed)
Chief Complaint:   OBESITY Laura Knox is here to discuss her progress with her obesity treatment plan along with follow-up of her obesity related diagnoses.   Today's visit was #: 9 Starting weight: 378 lbs Starting date: 12/27/2020 Today's weight: 362 lbs Today's date: 05/09/2021 Weight change since last visit: 1 lb Total lbs lost to date: 16 lbs Body mass index is 56.7 kg/m.  Total weight loss percentage to date: -4.23%  Interim History:  Laura Knox is Laura Knox patient.  This is her first office visit with me.  She is not logging her food, especially when eating off plan foods.  She states that she knows she is eating poorly, but does not really want to know "how bad".  Plan:  Over the next 2-3 weeks, log everything you eat for dinner, snacks, etc.  Current Meal Plan: the Category 4 Plan and keeping a food journal and adhering to recommended goals of 500-700 calories and 45 grams of protein at supper for 50% of the time.  Current Exercise Plan: Silver Sneakers and water aerobics for 60 minutes 6 times per week.  Assessment/Plan:   Medications Discontinued During This Encounter  Medication Reason   metFORMIN (GLUCOPHAGE) 500 MG tablet Reorder   Meds ordered this encounter  Medications   metFORMIN (GLUCOPHAGE) 500 MG tablet    Sig: 1 po with lunch qd    Dispense:  30 tablet    Refill:  0    1. Essential hypertension At goal. Medications: HCTZ 12.5 mg daily, Cozaar 100 mg daily.  Started on HCTZ 3 weeks ago or so in addition to losartan.  Blood pressure up today because she was behind an accident coming into the office today, she states.  Plan:  Continue medications for now.  Avoid buying foods that are: processed, frozen, or prepackaged to avoid excess salt. Ambulatory blood pressure monitoring was encouraged with a goal of at least 2-3 times weekly or when feeling poorly.  She was instructed to keep a log for [place] to review at each office visit.   We will watch for signs of  hypotension as she continues lifestyle modifications. We will continue to monitor closely alongside her PCP and/or Specialist.  Regular follow up with PCP and specialists was also encouraged.  Focus on prudent nutritional plan.  BP Readings from Last 3 Encounters:  05/09/21 140/70  04/16/21 (!) 145/80  04/13/21 140/80   Lab Results  Component Value Date   CREATININE 0.6 02/06/2021   2. Pre-diabetes Not at goal. Goal is HgbA1c < 5.7.  Medication: None.  She endorses snacking after dinner.  A1c was 5.9 ~3 months ago.  Plan:  Refill metformin today.  She will continue to focus on protein-rich, low simple carbohydrate foods. We reviewed the importance of hydration, regular exercise for stress reduction, and restorative sleep.  She will change her metformin dose so that she takes it at lunch.  Recheck A1c at next office visit.  Lab Results  Component Value Date   HGBA1C 5.9 02/06/2021   Lab Results  Component Value Date   INSULIN 20.4 12/27/2020   - Refill metFORMIN (GLUCOPHAGE) 500 MG tablet; 1 po with lunch qd  Dispense: 30 tablet; Refill: 0  3. At risk for diabetes mellitus - Chanah was given diabetes prevention education and counseling today of more than 9 minutes.  - Counseled patient on pathophysiology of disease and meaning/ implication of lab results.  - Reviewed how certain foods can either stimulate or inhibit insulin  release, and subsequently affect hunger pathways  - Importance of following a healthy meal plan with limiting amounts of simple carbohydrates discussed with patient - Effects of regular aerobic exercise on blood sugar regulation reviewed and encouraged an eventual goal of 30 min 5d/week or more as a minimum.  - Briefly discussed treatment options, which always include dietary and lifestyle modification as first line.   - Handouts provided at patient's desire and/or told to go online to the American Diabetes Association website for further information.  4. Obesity  with current BMI 56  Course: Rochel is currently in the action stage of change. As such, her goal is to continue with weight loss efforts.   Nutrition goals: She has agreed to the Category 4 Plan and keeping a food journal and adhering to recommended goals of 500-700 calories and 45 grams of protein at supper.   Exercise goals:  As is.  Behavioral modification strategies: avoiding temptations, planning for success, and keeping a strict food journal.  [name] has agreed to follow-up with our clinic in 3 weeks.  Recheck A1c at next office visit.  She was informed of the importance of frequent follow-up visits to maximize her success with intensive lifestyle modifications for her multiple health conditions.   Objective:   Blood pressure 140/70, pulse 82, temperature 98.3 F (36.8 C), height 5\' 7"  (1.702 m), weight (!) 362 lb (164.2 kg), SpO2 96 %. Body mass index is 56.7 kg/m.  General: Cooperative, alert, well developed, in no acute distress. HEENT: Conjunctivae and lids unremarkable. Cardiovascular: Regular rhythm.  Lungs: Normal work of breathing. Neurologic: No focal deficits.   Lab Results  Component Value Date   CREATININE 0.6 02/06/2021   BUN 21 02/06/2021   NA 135 (A) 02/06/2021   K 3.8 02/06/2021   CL 105 02/06/2021   CO2 29 (A) 02/06/2021   Lab Results  Component Value Date   ALT 26 02/06/2021   AST 19 02/06/2021   ALKPHOS 81 02/06/2021   BILITOT 0.4 12/27/2020   Lab Results  Component Value Date   HGBA1C 5.9 02/06/2021   HGBA1C 6.0 (H) 12/27/2020   Lab Results  Component Value Date   INSULIN 20.4 12/27/2020   Lab Results  Component Value Date   TSH 3.41 02/06/2021   Lab Results  Component Value Date   CHOL 141 12/27/2020   HDL 44 12/27/2020   LDLCALC 81 12/27/2020   TRIG 80 12/27/2020   Lab Results  Component Value Date   VD25OH 53.2 02/06/2021   VD25OH 55.2 12/27/2020   Lab Results  Component Value Date   WBC 8.4 02/06/2021   HGB 13.8  02/06/2021   HCT 42 02/06/2021   MCV 87 12/27/2020   PLT 336 02/06/2021   Lab Results  Component Value Date   FERRITIN 88 02/06/2021   Attestation Statements:   Reviewed by clinician on day of visit: allergies, medications, problem list, medical history, surgical history, family history, social history, and previous encounter notes.  I, 04/08/2021, CMA, am acting as Insurance claims handler for Energy manager, DO.  I have reviewed the above documentation for accuracy and completeness, and I agree with the above. [employer], D.O.  The 21st Century Cures Act was signed into law in 2016 which includes the topic of electronic health records.  This provides immediate access to information in MyChart.  This includes consultation notes, operative notes, office notes, lab results and pathology reports.  If you have any questions about what you  read please let us know at your next visit so we can discuss your concerns and take corrective action if need be.  We are right here with you.

## 2021-05-23 DIAGNOSIS — M79642 Pain in left hand: Secondary | ICD-10-CM | POA: Diagnosis not present

## 2021-05-23 DIAGNOSIS — M79641 Pain in right hand: Secondary | ICD-10-CM | POA: Diagnosis not present

## 2021-05-28 ENCOUNTER — Encounter (INDEPENDENT_AMBULATORY_CARE_PROVIDER_SITE_OTHER): Payer: Self-pay | Admitting: Family Medicine

## 2021-05-28 ENCOUNTER — Other Ambulatory Visit: Payer: Self-pay

## 2021-05-28 ENCOUNTER — Ambulatory Visit (INDEPENDENT_AMBULATORY_CARE_PROVIDER_SITE_OTHER): Payer: BC Managed Care – PPO | Admitting: Family Medicine

## 2021-05-28 VITALS — BP 137/84 | HR 82 | Temp 98.4°F | Ht 67.0 in | Wt 364.0 lb

## 2021-05-28 DIAGNOSIS — Z6841 Body Mass Index (BMI) 40.0 and over, adult: Secondary | ICD-10-CM

## 2021-05-28 DIAGNOSIS — M170 Bilateral primary osteoarthritis of knee: Secondary | ICD-10-CM | POA: Diagnosis not present

## 2021-05-28 DIAGNOSIS — Z9189 Other specified personal risk factors, not elsewhere classified: Secondary | ICD-10-CM

## 2021-05-28 DIAGNOSIS — R7303 Prediabetes: Secondary | ICD-10-CM | POA: Diagnosis not present

## 2021-05-28 MED ORDER — METFORMIN HCL 500 MG PO TABS: ORAL_TABLET | ORAL | 0 refills | Status: DC

## 2021-05-29 ENCOUNTER — Telehealth (INDEPENDENT_AMBULATORY_CARE_PROVIDER_SITE_OTHER): Payer: BC Managed Care – PPO | Admitting: Psychology

## 2021-05-29 DIAGNOSIS — F3289 Other specified depressive episodes: Secondary | ICD-10-CM

## 2021-05-30 NOTE — Progress Notes (Signed)
Chief Complaint:   OBESITY Laura Knox is here to discuss her progress with her obesity treatment plan along with follow-up of her obesity related diagnoses. Laura Knox is on the Category 4 Plan and keeping a food journal and adhering to recommended goals of 500-700 calories and 45 grams of protein daily and states she is following her eating plan approximately 60% of the time. Laura Knox states she is doing water aerobics for 60 minutes 6 times per week.  Today's visit was #: 10 Starting weight: 378 lbs Starting date: 12/27/2020 Today's weight: 364 lbs Today's date: 05/28/2021 Total lbs lost to date: 14 Total lbs lost since last in-office visit: 0  Interim History: Laura Knox is retaining some fluid today and she has actually lost another lb of fat since her last visit. She feels she did better on the original Category 4 plan and she would like to go back to that plan strictly. She is exercising almost daily.  Subjective:   1. Pre-diabetes Laura Knox is tolerating metformin well, but she notes increased PM polyphagia though.  2. Osteoarthritis of both knees, unspecified osteoarthritis type Laura Knox's knee pain has worsened recently and she is doing more water aerobics. She is working on weight loss to help improve pain.  3. At risk for diabetes mellitus Laura Knox is at higher than average risk for developing diabetes due to obesity.    Assessment/Plan:   1. Pre-diabetes Laura Knox agreed to increase metformin to 500 mg BID with no refills, and we will recheck labs in 3 weeks. She will continue to work on weight loss, exercise, and decreasing simple carbohydrates to help decrease the risk of diabetes.   - metFORMIN (GLUCOPHAGE) 500 MG tablet; 2 po with lunch qd  Dispense: 60 tablet; Refill: 0  2. Osteoarthritis of both knees, unspecified osteoarthritis type Laura Knox will continue Category 4 eating plan, and she will continue to follow up as directed.  3. At risk for diabetes mellitus Laura Knox was given approximately 15  minutes of diabetes education and counseling today. We discussed intensive lifestyle modifications today with an emphasis on weight loss as well as increasing exercise and decreasing simple carbohydrates in her diet. We also reviewed medication options with an emphasis on risk versus benefit of those discussed.   Repetitive spaced learning was employed today to elicit superior memory formation and behavioral change.  4. Obesity with current BMI 57.1 Laura Knox is currently in the action stage of change. As such, her goal is to continue with weight loss efforts. She has agreed to got back to the Category 4 Plan.   We will recheck fasting labs at her next visit.  Exercise goals: As is.  Behavioral modification strategies: increasing lean protein intake.  Laura Knox has agreed to follow-up with our clinic in 3 weeks. She was informed of the importance of frequent follow-up visits to maximize her success with intensive lifestyle modifications for her multiple health conditions.   Objective:   Blood pressure 137/84, pulse 82, temperature 98.4 F (36.9 C), height 5\' 7"  (1.702 m), weight (!) 364 lb (165.1 kg), SpO2 96 %. Body mass index is 57.01 kg/m.  General: Cooperative, alert, well developed, in no acute distress. HEENT: Conjunctivae and lids unremarkable. Cardiovascular: Regular rhythm.  Lungs: Normal work of breathing. Neurologic: No focal deficits.   Lab Results  Component Value Date   CREATININE 0.6 02/06/2021   BUN 21 02/06/2021   NA 135 (A) 02/06/2021   K 3.8 02/06/2021   CL 105 02/06/2021   CO2 29 (A)  02/06/2021   Lab Results  Component Value Date   ALT 26 02/06/2021   AST 19 02/06/2021   ALKPHOS 81 02/06/2021   BILITOT 0.4 12/27/2020   Lab Results  Component Value Date   HGBA1C 5.9 02/06/2021   HGBA1C 6.0 (H) 12/27/2020   Lab Results  Component Value Date   INSULIN 20.4 12/27/2020   Lab Results  Component Value Date   TSH 3.41 02/06/2021   Lab Results  Component  Value Date   CHOL 141 12/27/2020   HDL 44 12/27/2020   LDLCALC 81 12/27/2020   TRIG 80 12/27/2020   Lab Results  Component Value Date   VD25OH 53.2 02/06/2021   VD25OH 55.2 12/27/2020   Lab Results  Component Value Date   WBC 8.4 02/06/2021   HGB 13.8 02/06/2021   HCT 42 02/06/2021   MCV 87 12/27/2020   PLT 336 02/06/2021   Lab Results  Component Value Date   FERRITIN 88 02/06/2021   Attestation Statements:   Reviewed by clinician on day of visit: allergies, medications, problem list, medical history, surgical history, family history, social history, and previous encounter notes.   I, Burt Knack, am acting as transcriptionist for Quillian Quince, MD.  I have reviewed the above documentation for accuracy and completeness, and I agree with the above. -  Quillian Quince, MD

## 2021-06-01 ENCOUNTER — Other Ambulatory Visit: Payer: Self-pay | Admitting: Gastroenterology

## 2021-06-01 ENCOUNTER — Other Ambulatory Visit: Payer: Self-pay | Admitting: Medical

## 2021-06-18 ENCOUNTER — Other Ambulatory Visit: Payer: Self-pay

## 2021-06-18 ENCOUNTER — Encounter (INDEPENDENT_AMBULATORY_CARE_PROVIDER_SITE_OTHER): Payer: Self-pay | Admitting: Family Medicine

## 2021-06-18 ENCOUNTER — Ambulatory Visit (INDEPENDENT_AMBULATORY_CARE_PROVIDER_SITE_OTHER): Payer: BC Managed Care – PPO | Admitting: Family Medicine

## 2021-06-18 VITALS — BP 118/80 | HR 72 | Temp 98.3°F | Ht 67.0 in | Wt 362.0 lb

## 2021-06-18 DIAGNOSIS — Z9189 Other specified personal risk factors, not elsewhere classified: Secondary | ICD-10-CM | POA: Diagnosis not present

## 2021-06-18 DIAGNOSIS — R7303 Prediabetes: Secondary | ICD-10-CM

## 2021-06-18 DIAGNOSIS — Z6841 Body Mass Index (BMI) 40.0 and over, adult: Secondary | ICD-10-CM | POA: Diagnosis not present

## 2021-06-18 DIAGNOSIS — E559 Vitamin D deficiency, unspecified: Secondary | ICD-10-CM

## 2021-06-18 DIAGNOSIS — E66813 Obesity, class 3: Secondary | ICD-10-CM

## 2021-06-18 MED ORDER — METFORMIN HCL 500 MG PO TABS: ORAL_TABLET | ORAL | 0 refills | Status: DC

## 2021-06-18 NOTE — Progress Notes (Signed)
Chief Complaint:   OBESITY Laura Knox is here to discuss her progress with her obesity treatment plan along with follow-up of her obesity related diagnoses. Laura Knox is on the Category 4 Plan and states she is following her eating plan approximately 30% of the time. Laura Knox states she is doing light aerobics and water aerobics for 60 minutes 6 times per week.  Today's visit was #: 11 Starting weight: 378 lbs Starting date: 12/27/2020 Today's weight: 362 lbs Today's date: 06/18/2021 Total lbs lost to date: 16 Total lbs lost since last in-office visit: 2  Interim History: Laura Knox continues to do well with weight loss. She is exercising regularly and feels well overall. She sometimes struggles with finding a routine in her retirement.  Subjective:   1. Pre-diabetes Laura Knox is doing well with diet and weight loss. No side effects were noted with metformin.  2. Vitamin D deficiency Laura Knox is stable on Vit D, but her level is not yet at goal and she is due for labs.  3. At risk for impaired metabolic function Laura Knox is at increased risk for impaired metabolic function due to current nutrition and muscle mass.  Assessment/Plan:   1. Pre-diabetes Laura Knox will continue to work on diet, exercise, and decreasing simple carbohydrates to help decrease the risk of diabetes. We will check labs today, and we will refill metformin for   - metFORMIN (GLUCOPHAGE) 500 MG tablet; 2 po with lunch qd  Dispense: 60 tablet; Refill: 0 - CMP14+EGFR - Insulin, random - Hemoglobin A1c  2. Vitamin D deficiency Low Vitamin D level contributes to fatigue and are associated with obesity, breast, and colon cancer. We will check labs today. Laura Knox will follow-up for routine testing of Vitamin D, at least 2-3 times per year to avoid over-replacement.  - VITAMIN D 25 Hydroxy (Vit-D Deficiency, Fractures)  3. At risk for impaired metabolic function Laura Knox was given approximately 30 minutes of impaired  metabolic function  prevention counseling today. We discussed intensive lifestyle modifications today with an emphasis on specific nutrition and exercise instructions and strategies.   Repetitive spaced learning was employed today to elicit superior memory formation and behavioral change.  4. Obesity with current BMI 56.7 Laura Knox is currently in the action stage of change. As such, her goal is to continue with weight loss efforts. She has agreed to the Category 4 Plan.   Exercise goals: As is.  Behavioral modification strategies: increasing lean protein intake and meal planning and cooking strategies.  Laura Knox has agreed to follow-up with our clinic in 3 weeks. She was informed of the importance of frequent follow-up visits to maximize her success with intensive lifestyle modifications for her multiple health conditions.   Laura Knox was informed we would discuss her lab results at her next visit unless there is a critical issue that needs to be addressed sooner. Laura Knox agreed to keep her next visit at the agreed upon time to discuss these results.  Objective:   Blood pressure 118/80, pulse 72, temperature 98.3 F (36.8 C), height _0  (1.702 m), weight (!) 362 lb (164.2 kg), SpO2 99 %. Body mass index is 56.7 kg/m.  General: Cooperative, alert, well developed, in no acute distress. HEENT: Conjunctivae and lids unremarkable. Cardiovascular: Regular rhythm.  Lungs: Normal work of breathing. Neurologic: No focal deficits.   Lab Results  Component Value Date   CREATININE 0.6 02/06/2021   BUN 21 02/06/2021   NA 135 (A) 02/06/2021   K 3.8 02/06/2021   CL 105 02/06/2021  CO2 29 (A) 02/06/2021   Lab Results  Component Value Date   ALT 26 02/06/2021   AST 19 02/06/2021   ALKPHOS 81 02/06/2021   BILITOT 0.4 12/27/2020   Lab Results  Component Value Date   HGBA1C 5.9 02/06/2021   HGBA1C 6.0 (H) 12/27/2020   Lab Results  Component Value Date   INSULIN 20.4 12/27/2020   Lab Results  Component Value  Date   TSH 3.41 02/06/2021   Lab Results  Component Value Date   CHOL 141 12/27/2020   HDL 44 12/27/2020   LDLCALC 81 12/27/2020   TRIG 80 12/27/2020   Lab Results  Component Value Date   VD25OH 53.2 02/06/2021   VD25OH 55.2 12/27/2020   Lab Results  Component Value Date   WBC 8.4 02/06/2021   HGB 13.8 02/06/2021   HCT 42 02/06/2021   MCV 87 12/27/2020   PLT 336 02/06/2021   Lab Results  Component Value Date   FERRITIN 88 02/06/2021   Attestation Statements:   Reviewed by clinician on day of visit: allergies, medications, problem list, medical history, surgical history, family history, social history, and previous encounter notes.   I, Trixie Dredge, am acting as transcriptionist for Dennard Nip, MD.  I have reviewed the above documentation for accuracy and completeness, and I agree with the above. -  Dennard Nip, MD

## 2021-06-19 ENCOUNTER — Other Ambulatory Visit: Payer: Self-pay | Admitting: Medical

## 2021-06-19 ENCOUNTER — Other Ambulatory Visit: Payer: Self-pay | Admitting: Gastroenterology

## 2021-06-19 LAB — CMP14+EGFR
ALT: 14 IU/L (ref 0–32)
AST: 14 IU/L (ref 0–40)
Albumin/Globulin Ratio: 1.4 (ref 1.2–2.2)
Albumin: 4.3 g/dL (ref 3.8–4.9)
Alkaline Phosphatase: 109 IU/L (ref 44–121)
BUN/Creatinine Ratio: 30 — ABNORMAL HIGH (ref 9–23)
BUN: 21 mg/dL (ref 6–24)
Bilirubin Total: 0.4 mg/dL (ref 0.0–1.2)
CO2: 21 mmol/L (ref 20–29)
Calcium: 9.4 mg/dL (ref 8.7–10.2)
Chloride: 102 mmol/L (ref 96–106)
Creatinine, Ser: 0.7 mg/dL (ref 0.57–1.00)
Globulin, Total: 3.1 g/dL (ref 1.5–4.5)
Glucose: 90 mg/dL (ref 65–99)
Potassium: 4.2 mmol/L (ref 3.5–5.2)
Sodium: 139 mmol/L (ref 134–144)
Total Protein: 7.4 g/dL (ref 6.0–8.5)
eGFR: 101 mL/min/{1.73_m2} (ref >59)

## 2021-06-19 LAB — VITAMIN D 25 HYDROXY (VIT D DEFICIENCY, FRACTURES): Vit D, 25-Hydroxy: 76.3 ng/mL (ref 30.0–100.0)

## 2021-06-19 LAB — HEMOGLOBIN A1C
Est. average glucose Bld gHb Est-mCnc: 123 mg/dL
Hgb A1c MFr Bld: 5.9 % — ABNORMAL HIGH (ref 4.8–5.6)

## 2021-06-19 LAB — INSULIN, RANDOM: INSULIN: 16.7 u[IU]/mL (ref 2.6–24.9)

## 2021-06-21 ENCOUNTER — Other Ambulatory Visit (INDEPENDENT_AMBULATORY_CARE_PROVIDER_SITE_OTHER): Payer: Self-pay | Admitting: Family Medicine

## 2021-06-21 DIAGNOSIS — R7303 Prediabetes: Secondary | ICD-10-CM

## 2021-06-21 NOTE — Telephone Encounter (Signed)
Pt last seen by Dr. Beasley.  

## 2021-07-10 ENCOUNTER — Ambulatory Visit (INDEPENDENT_AMBULATORY_CARE_PROVIDER_SITE_OTHER): Payer: BC Managed Care – PPO | Admitting: Family Medicine

## 2021-07-10 ENCOUNTER — Encounter (INDEPENDENT_AMBULATORY_CARE_PROVIDER_SITE_OTHER): Payer: Self-pay | Admitting: Family Medicine

## 2021-07-10 ENCOUNTER — Other Ambulatory Visit: Payer: Self-pay

## 2021-07-10 VITALS — BP 131/84 | HR 94 | Temp 98.2°F | Ht 67.0 in | Wt 365.0 lb

## 2021-07-10 DIAGNOSIS — R7303 Prediabetes: Secondary | ICD-10-CM | POA: Diagnosis not present

## 2021-07-10 DIAGNOSIS — I10 Essential (primary) hypertension: Secondary | ICD-10-CM | POA: Diagnosis not present

## 2021-07-10 DIAGNOSIS — Z9189 Other specified personal risk factors, not elsewhere classified: Secondary | ICD-10-CM

## 2021-07-10 DIAGNOSIS — Z6841 Body Mass Index (BMI) 40.0 and over, adult: Secondary | ICD-10-CM

## 2021-07-10 MED ORDER — METFORMIN HCL 500 MG PO TABS: ORAL_TABLET | ORAL | 0 refills | Status: DC

## 2021-07-11 NOTE — Progress Notes (Signed)
Chief Complaint:   OBESITY Laura Knox is here to discuss her progress with her obesity treatment plan along with follow-up of her obesity related diagnoses. Laura Knox is on the Category 4 Plan and states she is following her eating plan approximately 60% of the time. Laura Knox states she is working out, doing Geophysicist/field seismologist and swimming for 60 minutes 6 times per week.  Today's visit was #: 12 Starting weight: 378 lbs Starting date: 12/27/2020 Today's weight: 365 lbs Today's date: 07/10/2021 Total lbs lost to date: 13 Total lbs lost since last in-office visit: 0  Interim History: Laura Knox just got back from vacation. She is getting back into her new routine and she feels she will able to get back on track.  Subjective:   1. Pre-diabetes Laura Knox is working on diet, exercise, and weight loss. She changed insurance and she needs medications [facility].  2. Essential hypertension Laura Knox is stable on losartan and hydrochlorothiazide. She denies chest pain or headache.  3. At risk for heart disease Laura Knox is at a higher than average risk for cardiovascular disease due to obesity.   Assessment/Plan:   1. Pre-diabetes Laura Knox will continue to work on weight loss, exercise, and decreasing simple carbohydrates to help decrease the risk of diabetes. We will refill metformin for 90 days.  - metFORMIN (GLUCOPHAGE) 500 MG tablet; 2 po with lunch qd  Dispense: 180 tablet; Refill: 0  2. Essential hypertension Laura Knox will continue her medications, an work on weight loss and increasing her water intake to improve blood pressure control. We will watch for signs of hypotension as she continues her lifestyle modifications.  3. At risk for heart disease Laura Knox was given approximately 15 minutes of coronary artery disease prevention counseling today. She is 55 y.o. female and has risk factors for heart disease including obesity. We discussed intensive lifestyle modifications today with an emphasis on  specific weight loss instructions and strategies.   Repetitive spaced learning was employed today to elicit superior memory formation and behavioral change.  4. Obesity with current BMI 57.2 Laura Knox is currently in the action stage of change. As such, her goal is to continue with weight loss efforts. She has agreed to the Category 4 Plan.   Exercise goals: As is.  Behavioral modification strategies: increasing water intake and meal planning and cooking strategies.  Laura Knox has agreed to follow-up with our clinic in 3 weeks. She was informed of the importance of frequent follow-up visits to maximize her success with intensive lifestyle modifications for her multiple health conditions.   Objective:   Blood pressure 131/84, pulse 94, temperature 98.2 F (36.8 C), height 5\' 7"  (1.702 m), weight (!) 365 lb (165.6 kg), SpO2 97 %. Body mass index is 57.17 kg/m.  General: Cooperative, alert, well developed, in no acute distress. HEENT: Conjunctivae and lids unremarkable. Cardiovascular: Regular rhythm.  Lungs: Normal work of breathing. Neurologic: No focal deficits.   Lab Results  Component Value Date   CREATININE 0.70 06/18/2021   BUN 21 06/18/2021   NA 139 06/18/2021   K 4.2 06/18/2021   CL 102 06/18/2021   CO2 21 06/18/2021   Lab Results  Component Value Date   ALT 14 06/18/2021   AST 14 06/18/2021   ALKPHOS 109 06/18/2021   BILITOT 0.4 06/18/2021   Lab Results  Component Value Date   HGBA1C 5.9 (H) 06/18/2021   HGBA1C 5.9 02/06/2021   HGBA1C 6.0 (H) 12/27/2020   Lab Results  Component Value Date  INSULIN 16.7 06/18/2021   INSULIN 20.4 12/27/2020   Lab Results  Component Value Date   TSH 3.41 02/06/2021   Lab Results  Component Value Date   CHOL 141 12/27/2020   HDL 44 12/27/2020   LDLCALC 81 12/27/2020   TRIG 80 12/27/2020   Lab Results  Component Value Date   VD25OH 76.3 06/18/2021   VD25OH 53.2 02/06/2021   VD25OH 55.2 12/27/2020   Lab Results   Component Value Date   WBC 8.4 02/06/2021   HGB 13.8 02/06/2021   HCT 42 02/06/2021   MCV 87 12/27/2020   PLT 336 02/06/2021   Lab Results  Component Value Date   FERRITIN 88 02/06/2021   Attestation Statements:   Reviewed by clinician on day of visit: allergies, medications, problem list, medical history, surgical history, family history, social history, and previous encounter notes.   I, Burt Knack, am acting as transcriptionist for Quillian Quince, MD.  I have reviewed the above documentation for accuracy and completeness, and I agree with the above. -  Quillian Quince, MD

## 2021-07-19 ENCOUNTER — Other Ambulatory Visit: Payer: Self-pay | Admitting: Gastroenterology

## 2021-07-25 ENCOUNTER — Other Ambulatory Visit: Payer: Self-pay

## 2021-07-25 MED ORDER — THYROID 120 MG PO TABS: 120.0000 mg | ORAL_TABLET | Freq: Every day | ORAL | 1 refills | Status: DC

## 2021-07-25 MED ORDER — ESTRADIOL 0.1 MG/24HR TD PTTW: 1.0000 | MEDICATED_PATCH | TRANSDERMAL | 2 refills | Status: DC

## 2021-07-26 ENCOUNTER — Other Ambulatory Visit: Payer: Self-pay | Admitting: Medical

## 2021-07-30 ENCOUNTER — Encounter (INDEPENDENT_AMBULATORY_CARE_PROVIDER_SITE_OTHER): Payer: Self-pay | Admitting: Family Medicine

## 2021-07-30 ENCOUNTER — Other Ambulatory Visit: Payer: Self-pay

## 2021-07-30 ENCOUNTER — Ambulatory Visit (INDEPENDENT_AMBULATORY_CARE_PROVIDER_SITE_OTHER): Payer: BC Managed Care – PPO | Admitting: Family Medicine

## 2021-07-30 VITALS — BP 139/75 | HR 95 | Temp 97.9°F | Ht 67.0 in | Wt 362.0 lb

## 2021-07-30 DIAGNOSIS — R7303 Prediabetes: Secondary | ICD-10-CM

## 2021-07-30 DIAGNOSIS — Z9189 Other specified personal risk factors, not elsewhere classified: Secondary | ICD-10-CM | POA: Diagnosis not present

## 2021-07-30 DIAGNOSIS — Z6841 Body Mass Index (BMI) 40.0 and over, adult: Secondary | ICD-10-CM | POA: Diagnosis not present

## 2021-07-30 DIAGNOSIS — E559 Vitamin D deficiency, unspecified: Secondary | ICD-10-CM | POA: Diagnosis not present

## 2021-07-30 MED ORDER — INSULIN PEN NEEDLE 32G X 4 MM MISC: 1.0000 | 0 refills | Status: DC

## 2021-07-30 MED ORDER — OZEMPIC (0.25 OR 0.5 MG/DOSE) 2 MG/1.5ML ~~LOC~~ SOPN: 0.5000 mg | PEN_INJECTOR | SUBCUTANEOUS | 0 refills | Status: DC

## 2021-07-30 NOTE — Progress Notes (Signed)
Chief Complaint:   OBESITY Laura Knox is here to discuss her progress with her obesity treatment plan along with follow-up of her obesity related diagnoses. Laura Knox is on the Category 4 Plan and states she is following her eating plan approximately 80% of the time. Laura Knox states she is going gym, and swim class for 60 minutes 5-6 times per week.  Today's visit was #: 13 Starting weight: 378 lbs Starting date: 12/27/2020 Today's weight: 362 lbs Today's date: 07/30/2021 Total lbs lost to date: 16 Total lbs lost since last in-office visit: 3  Interim History: Laura Knox continues to do well with weight loss on her plan. Her hunger has improved and she is trying to add some intermittent fasting to her plan.  Subjective:   1. Pre-diabetes Laura Knox's last A1c was at 5.9. She is doing well with her diet, weight loss, and metformin but she wonders if a GLP-1 could be beneficial. I discussed labs with the patient.  2. Vitamin D deficiency Laura Knox's labs were reviewed, her level is at goal and she is at risk of over-replacement. She is on multivitamins now.  3. At risk for heart disease Laura Knox is at a higher than average risk for cardiovascular disease due to obesity.   Assessment/Plan:   1. Pre-diabetes Burkley agreed to start Ozempic 0.25 mg q weekly with no refills, and nano needles #100 with no refills; and she will continue metformin. She will continue to work on weight loss, exercise, and decreasing simple carbohydrates to help decrease the risk of diabetes.   2. Vitamin D deficiency Low Vitamin D level contributes to fatigue and are associated with obesity, breast, and colon cancer. Laura Knox will continue her multivitamin, and she will discontinue prescription Vitamin D. We will recheck labs in 2 months and she will follow-up for routine testing of Vitamin D, at least 2-3 times per year to avoid over-replacement.  3. At risk for heart disease Laura Knox was given approximately 15 minutes of coronary artery  disease prevention counseling today. She is 55 y.o. female and has risk factors for heart disease including obesity. We discussed intensive lifestyle modifications today with an emphasis on specific weight loss instructions and strategies.   Repetitive spaced learning was employed today to elicit superior memory formation and behavioral change.  4. Obesity with current BMI 56.8 Laura Knox is currently in the action stage of change. As such, her goal is to continue with weight loss efforts. She has agreed to the Category 4 Plan.   Exercise goals: As is.  Behavioral modification strategies: no skipping meals and meal planning and cooking strategies.  Laura Knox has agreed to follow-up with our clinic in 3 weeks. She was informed of the importance of frequent follow-up visits to maximize her success with intensive lifestyle modifications for her multiple health conditions.   Objective:   Blood pressure 139/75, pulse 95, temperature 97.9 F (36.6 C), height 5\' 7"  (1.702 m), weight (!) 362 lb (164.2 kg), SpO2 96 %. Body mass index is 56.7 kg/m.  General: Cooperative, alert, well developed, in no acute distress. HEENT: Conjunctivae and lids unremarkable. Cardiovascular: Regular rhythm.  Lungs: Normal work of breathing. Neurologic: No focal deficits.   Lab Results  Component Value Date   CREATININE 0.70 06/18/2021   BUN 21 06/18/2021   NA 139 06/18/2021   K 4.2 06/18/2021   CL 102 06/18/2021   CO2 21 06/18/2021   Lab Results  Component Value Date   ALT 14 06/18/2021   AST 14 06/18/2021  ALKPHOS 109 06/18/2021   BILITOT 0.4 06/18/2021   Lab Results  Component Value Date   HGBA1C 5.9 (H) 06/18/2021   HGBA1C 5.9 02/06/2021   HGBA1C 6.0 (H) 12/27/2020   Lab Results  Component Value Date   INSULIN 16.7 06/18/2021   INSULIN 20.4 12/27/2020   Lab Results  Component Value Date   TSH 3.41 02/06/2021   Lab Results  Component Value Date   CHOL 141 12/27/2020   HDL 44 12/27/2020    LDLCALC 81 12/27/2020   TRIG 80 12/27/2020   Lab Results  Component Value Date   VD25OH 76.3 06/18/2021   VD25OH 53.2 02/06/2021   VD25OH 55.2 12/27/2020   Lab Results  Component Value Date   WBC 8.4 02/06/2021   HGB 13.8 02/06/2021   HCT 42 02/06/2021   MCV 87 12/27/2020   PLT 336 02/06/2021   Lab Results  Component Value Date   FERRITIN 88 02/06/2021   Attestation Statements:   Reviewed by clinician on day of visit: allergies, medications, problem list, medical history, surgical history, family history, social history, and previous encounter notes.   I, Burt Knack, am acting as transcriptionist for Quillian Quince, MD.  I have reviewed the above documentation for accuracy and completeness, and I agree with the above. -  Quillian Quince, MD

## 2021-08-16 ENCOUNTER — Other Ambulatory Visit: Payer: Self-pay

## 2021-08-16 ENCOUNTER — Ambulatory Visit (INDEPENDENT_AMBULATORY_CARE_PROVIDER_SITE_OTHER): Payer: BC Managed Care – PPO | Admitting: Family Medicine

## 2021-08-16 ENCOUNTER — Encounter (INDEPENDENT_AMBULATORY_CARE_PROVIDER_SITE_OTHER): Payer: Self-pay | Admitting: Family Medicine

## 2021-08-16 VITALS — BP 133/75 | HR 66 | Temp 98.2°F | Ht 67.0 in | Wt 352.0 lb

## 2021-08-16 DIAGNOSIS — Z6841 Body Mass Index (BMI) 40.0 and over, adult: Secondary | ICD-10-CM

## 2021-08-16 DIAGNOSIS — R7303 Prediabetes: Secondary | ICD-10-CM

## 2021-08-16 NOTE — Progress Notes (Signed)
Chief Complaint:   OBESITY Laura Knox is here to discuss her progress with her obesity treatment plan along with follow-up of her obesity related diagnoses. Laura Knox is on the Category 4 Plan and states she is following her eating plan approximately 50% of the time. 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  Today's visit was #: 14 Starting weight: 378 lbs Starting date: 12/27/2020 Today's weight: 352 lbs Today's date: 08/16/2021 Total lbs lost to date: 26 Total lbs lost since last in-office visit: 10  Interim History: Laura Knox continues to do very well with weight loss since her last visit. Her hunger is greatly controlled on her GLP-1. She is exercising regularly and she feel well overall.  Subjective:   1. Pre-diabetes Laura Knox is on metformin and Ozempic. She tried taking the 0.5 mg dose and she felt some side effects, so she lowered her dose to 0.25 mg q weekly and she is doing well.  Assessment/Plan:   1. Pre-diabetes Laura Knox will continue Ozempic at 0.25 mg for now, and will continue metformin as is. We will reassess her dose in 3 weeks. She will continue to work on weight loss, exercise, and decreasing simple carbohydrates to help decrease the risk of diabetes.   2. Obesity with current BMI 55.2 Laura Knox is currently in the action stage of change. As such, her goal is to continue with weight loss efforts. She has agreed to the Category 4 Plan.   Exercise goals: As is.  Behavioral modification strategies: increasing lean protein intake.  Laura Knox has agreed to follow-up with our clinic in 3 weeks. She was informed of the importance of frequent follow-up visits to maximize her success with intensive lifestyle modifications for her multiple health conditions.   Objective:   Blood pressure 133/75, pulse 66, temperature 98.2 F (36.8 C), height 5\' 7"  (1.702 m), weight (!) 352 lb (159.7 kg), SpO2 97 %. Body mass index is 55.13 kg/m.  General:  Cooperative, alert, well developed, in no acute distress. HEENT: Conjunctivae and lids unremarkable. Cardiovascular: Regular rhythm.  Lungs: Normal work of breathing. Neurologic: No focal deficits.   Lab Results  Component Value Date   CREATININE 0.70 06/18/2021   BUN 21 06/18/2021   NA 139 06/18/2021   K 4.2 06/18/2021   CL 102 06/18/2021   CO2 21 06/18/2021   Lab Results  Component Value Date   ALT 14 06/18/2021   AST 14 06/18/2021   ALKPHOS 109 06/18/2021   BILITOT 0.4 06/18/2021   Lab Results  Component Value Date   HGBA1C 5.9 (H) 06/18/2021   HGBA1C 5.9 02/06/2021   HGBA1C 6.0 (H) 12/27/2020   Lab Results  Component Value Date   INSULIN 16.7 06/18/2021   INSULIN 20.4 12/27/2020   Lab Results  Component Value Date   TSH 3.41 02/06/2021   Lab Results  Component Value Date   CHOL 141 12/27/2020   HDL 44 12/27/2020   LDLCALC 81 12/27/2020   TRIG 80 12/27/2020   Lab Results  Component Value Date   VD25OH 76.3 06/18/2021   VD25OH 53.2 02/06/2021   VD25OH 55.2 12/27/2020   Lab Results  Component Value Date   WBC 8.4 02/06/2021   HGB 13.8 02/06/2021   HCT 42 02/06/2021   MCV 87 12/27/2020   PLT 336 02/06/2021   Lab Results  Component Value Date   FERRITIN 88 02/06/2021   Attestation Statements:   Reviewed by clinician on day of  visit: allergies, medications, problem list, medical history, surgical history, family history, social history, and previous encounter notes.  Time spent on visit including pre-visit chart review and post-visit care and charting was 32 minutes.    I, Trixie Dredge, am acting as transcriptionist for Dennard Nip, MD.  I have reviewed the above documentation for accuracy and completeness, and I agree with the above. -  Dennard Nip, MD

## 2021-08-22 ENCOUNTER — Other Ambulatory Visit: Payer: Self-pay | Admitting: Medical

## 2021-08-25 ENCOUNTER — Other Ambulatory Visit: Payer: Self-pay

## 2021-08-25 ENCOUNTER — Emergency Department
Admission: EM | Admit: 2021-08-25 | Discharge: 2021-08-25 | Disposition: A | Discharge status: Home / Self Care | Payer: BC Managed Care – PPO | Source: Home / Self Care | Attending: Family Medicine | Admitting: Family Medicine

## 2021-08-25 ENCOUNTER — Encounter: Payer: Self-pay | Admitting: Emergency Medicine

## 2021-08-25 DIAGNOSIS — J069 Acute upper respiratory infection, unspecified: Secondary | ICD-10-CM | POA: Diagnosis not present

## 2021-08-25 LAB — POC SARS CORONAVIRUS 2 AG -  ED: SARS Coronavirus 2 Ag: NEGATIVE

## 2021-08-25 MED ORDER — AMOXICILLIN 875 MG PO TABS: 875.0000 mg | ORAL_TABLET | Freq: Two times a day (BID) | ORAL | 0 refills | Status: DC

## 2021-08-25 NOTE — ED Provider Notes (Signed)
[name]    CSN: [id number] Arrival date & time: 08/25/21  0803      History   Chief Complaint Chief Complaint  Patient presents with   Nasal Congestion    HPI Laura Knox is a 55 y.o. female.   HPI  Patient is here for an upper respiratory infection.  The symptoms started yesterday.  She states started with a "scratchy throat".  Now she has nasal congestion, postnasal drip, and cough.  No fever or chills.  No headache or body ache.  She does desire COVID testing.  She states that she almost always ends up with bronchitis when she has an upper respiratory infection because she has "apnea" and trouble breathing at night.  She is here requesting an antibiotic.  I told her we would get the COVID test first and then decide  Past Medical History:  Diagnosis Date   B12 deficiency    Bilateral swelling of feet and ankles    Hypertension    Joint pain    Kidney stones    Knee pain    Osteoarthritis    Other fatigue    Prediabetes    Sleep apnea    Thyroid disease    Vitamin D deficiency     Patient Active Problem List   Diagnosis Date Noted   Special screening for malignant neoplasms, colon    Rectal polyp    OSA (obstructive sleep apnea) 04/04/2021   Pre-diabetes 01/29/2021   Essential hypertension 01/29/2021   Class 3 severe obesity with serious comorbidity and body mass index (BMI) of 60.0 to 69.9 in adult (HCC) 01/29/2021    Past Surgical History:  Procedure Laterality Date   COLONOSCOPY WITH PROPOFOL N/A 04/10/2021   Procedure: COLONOSCOPY WITH PROPOFOL;  Surgeon: Lynann Bologna, MD;  Location: [facility];  Service: Endoscopy;  Laterality: N/A;   KIDNEY STONE SURGERY     miniscus repair Left    POLYPECTOMY  04/10/2021   Procedure: POLYPECTOMY;  Surgeon: Lynann Bologna, MD;  Location: [facility];  Service: Endoscopy;;   SEPTOPLASTY      OB History     Gravida  0   Para  0   Term  0   Preterm  0   AB  0   Living  0      SAB  0    IAB  0   Ectopic  0   Multiple  0   Live Births  0            Home Medications    Prior to Admission medications   Medication Sig Start Date End Date Taking? Authorizing Provider  cetirizine (ZYRTEC) 10 MG tablet Take 10 mg by mouth daily.   Yes [provider]  estradiol (VIVELLE-DOT) 0.1 MG/24HR patch Place 1 patch (0.1 mg total) onto the skin 2 (two) times a week. 07/26/21  Yes Saguier, Ramon Dredge, PA-C  hydrochlorothiazide (MICROZIDE) 12.5 MG capsule TAKE 1 CAPSULE BY MOUTH EVERY DAY 08/22/21  Yes Saguier, Ramon Dredge, PA-C  Insulin Pen Needle 32G X 4 MM MISC 1 Package by Does not apply route once a week. 07/30/21  Yes Beasley, Caren D, MD  losartan (COZAAR) 100 MG tablet TAKE 1 TABLET BY MOUTH EVERYDAY AT BEDTIME 07/19/21  Yes Lynann Bologna, MD  metFORMIN (GLUCOPHAGE) 500 MG tablet 2 po with lunch qd 07/10/21  Yes Beasley, Caren D, MD  Naproxen Sodium 220 MG CAPS Take 880 mg by mouth 2 (two) times daily. Aleve   Yes  [provider]  Omega 3 1000 MG CAPS Take 2,000 mg by mouth 2 (two) times daily. Pro Omega   Yes [provider]  PRESCRIPTION MEDICATION Apply 0.5 mLs topically daily. TESTOSTERONE 2% CREAM Apply 1/2 mL (2 clicks)   behind alternating knees   Yes [provider]  pyridOXINE (VITAMIN B-6) 25 MG tablet Take 25 mg by mouth 3 (three) times a week. evening 03/23/20  Yes [provider]  Semaglutide,0.25 or 0.5MG /DOS, (OZEMPIC, 0.25 OR 0.5 MG/DOSE,) 2 MG/1.5ML SOPN Inject 0.5 mg into the skin once a week. 07/30/21  Yes Quillian Quince D, MD  thyroid (ARMOUR) 120 MG tablet Take 1 tablet (120 mg total) by mouth daily before breakfast. 07/25/21  Yes Saguier, Ramon Dredge, PA-C  VITAMIN D, CHOLECALCIFEROL, PO Take 5,000 Units by mouth at bedtime.   Yes [provider]  amoxicillin (AMOXIL) 875 MG tablet Take 1 tablet (875 mg total) by mouth 2 (two) times daily. 08/25/21   Eustace Moore, MD    Family History Family History  Problem  Relation Age of Onset   Healthy Mother    Obesity Mother    Cancer Father    Obesity Father    Cancer - Other Father    Stroke Maternal Grandmother    Lung cancer Maternal Grandfather     Social History Social History   Tobacco Use   Smoking status: Never   Smokeless tobacco: Never  Vaping Use   Vaping Use: Never used  Substance Use Topics   Alcohol use: Not Currently   Drug use: Never     Allergies   Patient has no known allergies.   Review of Systems Review of Systems See HPI  Physical Exam Triage Vital Signs ED Triage Vitals  Enc Vitals Group     BP 08/25/21 0822 (!) 156/84     Pulse Rate 08/25/21 0822 68     Resp --      Temp 08/25/21 0822 98.7 F (37.1 C)     Temp Source 08/25/21 0822 Oral     SpO2 08/25/21 0822 94 %     Weight 08/25/21 0823 (!) 320 lb (145.2 kg)     Height 08/25/21 0823 5\' 8"  (1.727 m)     Head Circumference --      Peak Flow --      Pain Score 08/25/21 0823 0     Pain Loc --      Pain Edu? --      Excl. in GC? --    No data found.  Updated Vital Signs BP (!) 156/84 (BP Location: Right Arm)   Pulse 68   Temp 98.7 F (37.1 C) (Oral)   Ht 5\' 7"  (1.702 m)   Wt (!) 159.7 kg   SpO2 94%   BMI 55.14 kg/m      Physical Exam Vitals and nursing note reviewed.  Constitutional:      General: She is not in acute distress.    Appearance: She is well-developed. She is obese.  HENT:     Head: Normocephalic and atraumatic.     Right Ear: Tympanic membrane, ear canal and external ear normal.     Left Ear: Tympanic membrane, ear canal and external ear normal.     Nose: Congestion present.     Mouth/Throat:     Mouth: Mucous membranes are moist.     Pharynx: Posterior oropharyngeal erythema present.  Eyes:     Conjunctiva/sclera: Conjunctivae normal.     Pupils:  Pupils are equal, round, and reactive to light.  Cardiovascular:     Rate and Rhythm: Normal rate and regular rhythm.     Heart sounds: Normal heart sounds.  Pulmonary:      Effort: Pulmonary effort is normal. No respiratory distress.     Breath sounds: No wheezing, rhonchi or rales.  Abdominal:     General: There is no distension.     Palpations: Abdomen is soft.     Comments: [place]  Musculoskeletal:        General: Normal range of motion.     Cervical back: Normal range of motion.  Lymphadenopathy:     Cervical: Cervical adenopathy present.  Skin:    General: Skin is warm and dry.  Neurological:     Mental Status: She is alert.  Psychiatric:        Mood and Affect: Mood normal.        Behavior: Behavior normal.     UC Treatments / Results  Labs (all labs ordered are listed, but only abnormal results are displayed) Labs Reviewed  POC SARS CORONAVIRUS 2 AG -  ED    EKG   Radiology No results found.  Procedures Procedures (including critical care time)  Medications Ordered in UC Medications - No data to display  Initial Impression / Assessment and Plan / UC Course  I have reviewed the triage vital signs and the nursing notes.  Pertinent labs & imaging results that were available during my care of the patient were reviewed by me and considered in my medical decision making (see chart for details).     Discussed that most  discussed bronchitis started as a viral infection.  It is appropriate to treat this at home with over-the-counter symptomatic medications.  Antibiotics are indicated only if she fails to improve over 7 to 10 days.  CDC guidelines reviewed with patient.  Antibiotic prescription is given in case needed. Final Clinical Impressions(s) / UC Diagnoses   Final diagnoses:  Acute upper respiratory infection     Discharge Instructions      The COVID test is negative Continue to drink plenty of fluid Consider taking Delsym every 12 hours for your cough I am giving you a written prescription for amoxicillin.  Please fill and take this if you fail to improve     ED Prescriptions     Medication Sig Dispense  Auth. Provider   amoxicillin (AMOXIL) 875 MG tablet Take 1 tablet (875 mg total) by mouth 2 (two) times daily. 14 tablet Eustace Moore, MD      PDMP not reviewed this encounter.   Eustace Moore, MD 08/25/21 [phone]

## 2021-08-25 NOTE — ED Triage Notes (Signed)
Patient c/o nasal drainage and a slight cough since last night.  Patient has a history of bronchitis and requesting an antbs and to be tested for COVID.  Patient is vaccinated for COVID.

## 2021-08-25 NOTE — Discharge Instructions (Signed)
The COVID test is negative Continue to drink plenty of fluid Consider taking Delsym every 12 hours for your cough I am giving you a written prescription for amoxicillin.  Please fill and take this if you fail to improve

## 2021-09-06 ENCOUNTER — Encounter (INDEPENDENT_AMBULATORY_CARE_PROVIDER_SITE_OTHER): Payer: Self-pay | Admitting: Family Medicine

## 2021-09-06 ENCOUNTER — Ambulatory Visit (INDEPENDENT_AMBULATORY_CARE_PROVIDER_SITE_OTHER): Payer: BC Managed Care – PPO | Admitting: Family Medicine

## 2021-09-06 ENCOUNTER — Other Ambulatory Visit: Payer: Self-pay

## 2021-09-06 VITALS — BP 154/81 | HR 103 | Temp 98.1°F | Ht 67.0 in | Wt 349.0 lb

## 2021-09-06 DIAGNOSIS — I10 Essential (primary) hypertension: Secondary | ICD-10-CM

## 2021-09-06 DIAGNOSIS — R7303 Prediabetes: Secondary | ICD-10-CM

## 2021-09-06 DIAGNOSIS — Z9189 Other specified personal risk factors, not elsewhere classified: Secondary | ICD-10-CM | POA: Diagnosis not present

## 2021-09-06 DIAGNOSIS — Z6841 Body Mass Index (BMI) 40.0 and over, adult: Secondary | ICD-10-CM

## 2021-09-06 MED ORDER — OZEMPIC (0.25 OR 0.5 MG/DOSE) 2 MG/1.5ML ~~LOC~~ SOPN: 0.5000 mg | PEN_INJECTOR | SUBCUTANEOUS | 0 refills | Status: DC

## 2021-09-06 NOTE — Progress Notes (Signed)
Chief Complaint:   OBESITY Laura Knox is here to discuss her progress with her obesity treatment plan along with follow-up of her obesity related diagnoses. Laura Knox is on the Category 4 Plan and states she is following her eating plan approximately 50% of the time. Laura Knox states she is doing Geophysicist/field seismologist and water aerobics for 60 minutes 6 times per week.  Today's visit was #: 15 Starting weight: 378 lbs Starting date: 12/27/2020 Today's weight: 349 lbs Today's date: 09/06/2021 Total lbs lost to date: 29 Total lbs lost since last in-office visit: 3  Interim History: Laura Knox continues to do well with weight loss. She has been sick with bronchitis and on antibiotics. She is working on meeting her protein goals.   Subjective:   1. Pre-diabetes Laura Knox is stable on Ozempic ans she had GI upset with 0.5 mg previously, but tried decreased to 0.25 mg. She wonders if she is ready to increase her dose.  2. Essential hypertension Laura Knox's blood pressure is elevated. She has been sick with bronchitis recently. No change in medications.   3. At risk for heart disease Laura Knox is at a higher than average risk for cardiovascular disease due to obesity.   Assessment/Plan:   1. Pre-diabetes Laura Knox agreed to increase Ozempic to 0.5 mg q weekly (ok to increase 9 clicks past 0.25 mg for now.), and she will continue metformin. She will continue to work on weight loss, exercise, and decreasing simple carbohydrates to help decrease the risk of diabetes.   - Semaglutide,0.25 or 0.5MG /DOS, (OZEMPIC, 0.25 OR 0.5 MG/DOSE,) 2 MG/1.5ML SOPN; Inject 0.5 mg into the skin once a week.  Dispense: 1.5 mL; Refill: 0  2. Essential hypertension Laura Knox will continue with diet and exercise to improve blood pressure control. We will recheck her blood pressure in 3 weeks.  3. At risk for heart disease Laura Knox was given approximately 15 minutes of coronary artery disease prevention counseling today. She is 55 y.o. female and has  risk factors for heart disease including obesity. We discussed intensive lifestyle modifications today with an emphasis on specific weight loss instructions and strategies.   Repetitive spaced learning was employed today to elicit superior memory formation and behavioral change.  4. Obesity with current BMI 55.2 Laura Knox is currently in the action stage of change. As such, her goal is to continue with weight loss efforts. She has agreed to the Category 4 Plan.   Exercise goals: As is.  Behavioral modification strategies: dealing with family or coworker sabotage and holiday eating strategies .  Laura Knox has agreed to follow-up with our clinic in 3 weeks. She was informed of the importance of frequent follow-up visits to maximize her success with intensive lifestyle modifications for her multiple health conditions.   Objective:   Blood pressure (!) 154/81, pulse (!) 103, temperature 98.1 F (36.7 C), height 5\' 7"  (1.702 m), weight (!) 349 lb (158.3 kg), SpO2 95 %. Body mass index is 54.66 kg/m.  General: Cooperative, alert, well developed, in no acute distress. HEENT: Conjunctivae and lids unremarkable. Cardiovascular: Regular rhythm.  Lungs: Normal work of breathing. Neurologic: No focal deficits.   Lab Results  Component Value Date   CREATININE 0.70 06/18/2021   BUN 21 06/18/2021   NA 139 06/18/2021   K 4.2 06/18/2021   CL 102 06/18/2021   CO2 21 06/18/2021   Lab Results  Component Value Date   ALT 14 06/18/2021   AST 14 06/18/2021   ALKPHOS 109 06/18/2021   BILITOT 0.4  06/18/2021   Lab Results  Component Value Date   HGBA1C 5.9 (H) 06/18/2021   HGBA1C 5.9 02/06/2021   HGBA1C 6.0 (H) 12/27/2020   Lab Results  Component Value Date   INSULIN 16.7 06/18/2021   INSULIN 20.4 12/27/2020   Lab Results  Component Value Date   TSH 3.41 02/06/2021   Lab Results  Component Value Date   CHOL 141 12/27/2020   HDL 44 12/27/2020   LDLCALC 81 12/27/2020   TRIG 80 12/27/2020    Lab Results  Component Value Date   VD25OH 76.3 06/18/2021   VD25OH 53.2 02/06/2021   VD25OH 55.2 12/27/2020   Lab Results  Component Value Date   WBC 8.4 02/06/2021   HGB 13.8 02/06/2021   HCT 42 02/06/2021   MCV 87 12/27/2020   PLT 336 02/06/2021   Lab Results  Component Value Date   FERRITIN 88 02/06/2021   Attestation Statements:   Reviewed by clinician on day of visit: allergies, medications, problem list, medical history, surgical history, family history, social history, and previous encounter notes.   I, Burt Knack, am acting as transcriptionist for Quillian Quince, MD.  I have reviewed the above documentation for accuracy and completeness, and I agree with the above. -  Quillian Quince, MD

## 2021-09-19 ENCOUNTER — Other Ambulatory Visit: Payer: Self-pay | Admitting: Medical

## 2021-09-27 ENCOUNTER — Encounter (INDEPENDENT_AMBULATORY_CARE_PROVIDER_SITE_OTHER): Payer: Self-pay | Admitting: Family Medicine

## 2021-09-27 ENCOUNTER — Ambulatory Visit (INDEPENDENT_AMBULATORY_CARE_PROVIDER_SITE_OTHER): Payer: BC Managed Care – PPO | Admitting: Family Medicine

## 2021-09-27 ENCOUNTER — Other Ambulatory Visit: Payer: Self-pay

## 2021-09-27 VITALS — BP 128/80 | HR 78 | Temp 97.6°F | Ht 67.0 in | Wt 346.0 lb

## 2021-09-27 DIAGNOSIS — I10 Essential (primary) hypertension: Secondary | ICD-10-CM | POA: Diagnosis not present

## 2021-09-27 DIAGNOSIS — Z9189 Other specified personal risk factors, not elsewhere classified: Secondary | ICD-10-CM | POA: Diagnosis not present

## 2021-09-27 DIAGNOSIS — R7303 Prediabetes: Secondary | ICD-10-CM | POA: Diagnosis not present

## 2021-09-27 DIAGNOSIS — Z6841 Body Mass Index (BMI) 40.0 and over, adult: Secondary | ICD-10-CM

## 2021-09-27 MED ORDER — OZEMPIC (0.25 OR 0.5 MG/DOSE) 2 MG/1.5ML ~~LOC~~ SOPN: 0.5000 mg | PEN_INJECTOR | SUBCUTANEOUS | 0 refills | Status: DC

## 2021-09-27 MED ORDER — METFORMIN HCL 500 MG PO TABS: ORAL_TABLET | ORAL | 0 refills | Status: DC

## 2021-09-27 NOTE — Progress Notes (Signed)
Chief Complaint:   OBESITY Laura Knox is here to discuss her progress with her obesity treatment plan along with follow-up of her obesity related diagnoses. Laura Knox is on the Category 4 Plan and states she is following her eating plan approximately 80% of the time. Laura Knox states she is in the pool and doing aerobics for 60 minutes 3 times per week.  Today's visit was #: 16 Starting weight: 378 lbs Starting date: 12/27/2020 Today's weight: 346 lbs Today's date: 09/27/2021 Total lbs lost to date: 32 Total lbs lost since last in-office visit: 3  Interim History: Laura Knox has done very well with weight loss, even over Thanksgiving. She is exercising more and she feels well overall.  Subjective:   1. Pre-diabetes Laura Knox has done well with diet and exercise. She is doing well with exercise and her medications.  2. Essential hypertension Laura Knox's blood pressure is stable with diet and exercise. She denies chest pain or dizziness.  3. At risk for impaired metabolic function Laura Knox is at increased risk for impaired metabolic function due to decreased protein.  Assessment/Plan:   1. Pre-diabetes Laura Knox will continue to work on weight loss, exercise, and decreasing simple carbohydrates to help decrease the risk of diabetes. We will refill metformin and Ozempic for 1 month.  - metFORMIN (GLUCOPHAGE) 500 MG tablet; 2 po with lunch qd  Dispense: 180 tablet; Refill: 0 - Semaglutide,0.25 or 0.5MG /DOS, (OZEMPIC, 0.25 OR 0.5 MG/DOSE,) 2 MG/1.5ML SOPN; Inject 0.5 mg into the skin once a week.  Dispense: 1.5 mL; Refill: 0  2. Essential hypertension Laura Knox will continue hydrochlorothiazide, diet, and exercise. She will continue to follow up as directed as she continues her lifestyle modifications.  3. At risk for impaired metabolic function Laura Knox was given approximately 15 minutes of impaired  metabolic function prevention counseling today. We discussed intensive lifestyle modifications today with an emphasis  on specific nutrition and exercise instructions and strategies.   Repetitive spaced learning was employed today to elicit superior memory formation and behavioral change.  4. Obesity with current BMI 54.3 Laura Knox is currently in the action stage of change. As such, her goal is to continue with weight loss efforts. She has agreed to the Category 4 Plan.   Exercise goals: As is.  Behavioral modification strategies: increasing lean protein intake and holiday eating strategies .  Laura Knox has agreed to follow-up with our clinic in 3 weeks. She was informed of the importance of frequent follow-up visits to maximize her success with intensive lifestyle modifications for her multiple health conditions.   Objective:   Blood pressure 128/80, pulse 78, temperature 97.6 F (36.4 C), height 5\' 7"  (1.702 m), weight (!) 346 lb (156.9 kg), SpO2 97 %. Body mass index is 54.19 kg/m.  General: Cooperative, alert, well developed, in no acute distress. HEENT: Conjunctivae and lids unremarkable. Cardiovascular: Regular rhythm.  Lungs: Normal work of breathing. Neurologic: No focal deficits.   Lab Results  Component Value Date   CREATININE 0.70 06/18/2021   BUN 21 06/18/2021   NA 139 06/18/2021   K 4.2 06/18/2021   CL 102 06/18/2021   CO2 21 06/18/2021   Lab Results  Component Value Date   ALT 14 06/18/2021   AST 14 06/18/2021   ALKPHOS 109 06/18/2021   BILITOT 0.4 06/18/2021   Lab Results  Component Value Date   HGBA1C 5.9 (H) 06/18/2021   HGBA1C 5.9 02/06/2021   HGBA1C 6.0 (H) 12/27/2020   Lab Results  Component Value Date   INSULIN  16.7 06/18/2021   INSULIN 20.4 12/27/2020   Lab Results  Component Value Date   TSH 3.41 02/06/2021   Lab Results  Component Value Date   CHOL 141 12/27/2020   HDL 44 12/27/2020   LDLCALC 81 12/27/2020   TRIG 80 12/27/2020   Lab Results  Component Value Date   VD25OH 76.3 06/18/2021   VD25OH 53.2 02/06/2021   VD25OH 55.2 12/27/2020   Lab  Results  Component Value Date   WBC 8.4 02/06/2021   HGB 13.8 02/06/2021   HCT 42 02/06/2021   MCV 87 12/27/2020   PLT 336 02/06/2021   Lab Results  Component Value Date   FERRITIN 88 02/06/2021   Attestation Statements:   Reviewed by clinician on day of visit: allergies, medications, problem list, medical history, surgical history, family history, social history, and previous encounter notes.   I, Burt Knack, am acting as transcriptionist for Quillian Quince, MD.  I have reviewed the above documentation for accuracy and completeness, and I agree with the above. -  Quillian Quince, MD

## 2021-10-17 ENCOUNTER — Other Ambulatory Visit: Payer: Self-pay

## 2021-10-17 ENCOUNTER — Ambulatory Visit (INDEPENDENT_AMBULATORY_CARE_PROVIDER_SITE_OTHER): Payer: BC Managed Care – PPO | Admitting: Family Medicine

## 2021-10-17 ENCOUNTER — Encounter (INDEPENDENT_AMBULATORY_CARE_PROVIDER_SITE_OTHER): Payer: Self-pay | Admitting: Family Medicine

## 2021-10-17 VITALS — BP 148/91 | HR 80 | Temp 97.6°F | Ht 67.0 in | Wt 343.0 lb

## 2021-10-17 DIAGNOSIS — Z6841 Body Mass Index (BMI) 40.0 and over, adult: Secondary | ICD-10-CM

## 2021-10-17 DIAGNOSIS — F439 Reaction to severe stress, unspecified: Secondary | ICD-10-CM | POA: Diagnosis not present

## 2021-10-17 DIAGNOSIS — R7303 Prediabetes: Secondary | ICD-10-CM

## 2021-10-18 ENCOUNTER — Other Ambulatory Visit: Payer: Self-pay | Admitting: Medical

## 2021-10-18 NOTE — Progress Notes (Signed)
Chief Complaint:   OBESITY Laura Knox is here to discuss her progress with her obesity treatment plan along with follow-up of her obesity related diagnoses. Laura Knox is on the Category 4 Plan and states she is following her eating plan approximately 50-60% of the time. Laura Knox states she is doing Silver sneakers for 60 minutes 3 times per week.  Today's visit was #: 17 Starting weight: 378 lbs Starting date: 12/27/2020 Today's weight: 343 lbs Today's date: 10/17/2021 Total lbs lost to date: 35 Total lbs lost since last in-office visit: 3  Interim History: Laura Knox continues to do well with weight loss even with increased temptations. She is working on portion control and trying to decrease simple carbohydrates.  Subjective:   1. Pre-diabetes Laura Knox is stable on Ozempic, and she notes decreased polyphagia with no side effects.  2. Stress Laura Knox is dealing with family stress and this is affecting her sleep at times. Her blood pressure is mildly elevated today.  Assessment/Plan:   1. Pre-diabetes Laura Knox will continue Ozempic, diet, and exercise to help decrease the risk of diabetes.   2. Stress Laura Knox is to continue to work on sleep hygiene and stress reduction techniques, and will continue to monitor.  3. Obesity BMI today is 41 Laura Knox is currently in the action stage of change. As such, her goal is to continue with weight loss efforts. She has agreed to the Category 4 Plan.   Exercise goals: As is.  Behavioral modification strategies: increasing lean protein intake and meal planning and cooking strategies.  Laura Knox has agreed to follow-up with our clinic in 3 to 4 weeks. She was informed of the importance of frequent follow-up visits to maximize her success with intensive lifestyle modifications for her multiple health conditions.   Objective:   Blood pressure (!) 148/91, pulse 80, temperature 97.6 F (36.4 C), height 5\' 7"  (1.702 m), weight (!) 343 lb (155.6 kg), SpO2 95 %. Body mass  index is 53.72 kg/m.  General: Cooperative, alert, well developed, in no acute distress. HEENT: Conjunctivae and lids unremarkable. Cardiovascular: Regular rhythm.  Lungs: Normal work of breathing. Neurologic: No focal deficits.   Lab Results  Component Value Date   CREATININE 0.70 06/18/2021   BUN 21 06/18/2021   NA 139 06/18/2021   K 4.2 06/18/2021   CL 102 06/18/2021   CO2 21 06/18/2021   Lab Results  Component Value Date   ALT 14 06/18/2021   AST 14 06/18/2021   ALKPHOS 109 06/18/2021   BILITOT 0.4 06/18/2021   Lab Results  Component Value Date   HGBA1C 5.9 (H) 06/18/2021   HGBA1C 5.9 02/06/2021   HGBA1C 6.0 (H) 12/27/2020   Lab Results  Component Value Date   INSULIN 16.7 06/18/2021   INSULIN 20.4 12/27/2020   Lab Results  Component Value Date   TSH 3.41 02/06/2021   Lab Results  Component Value Date   CHOL 141 12/27/2020   HDL 44 12/27/2020   LDLCALC 81 12/27/2020   TRIG 80 12/27/2020   Lab Results  Component Value Date   VD25OH 76.3 06/18/2021   VD25OH 53.2 02/06/2021   VD25OH 55.2 12/27/2020   Lab Results  Component Value Date   WBC 8.4 02/06/2021   HGB 13.8 02/06/2021   HCT 42 02/06/2021   MCV 87 12/27/2020   PLT 336 02/06/2021   Lab Results  Component Value Date   FERRITIN 88 02/06/2021   Attestation Statements:   Reviewed by clinician on day of visit: allergies, medications,  problem list, medical history, surgical history, family history, social history, and previous encounter notes.  Time spent on visit including pre-visit chart review and post-visit care and charting was 32 minutes.    I, Burt Knack, am acting as transcriptionist for Quillian Quince, MD.  I have reviewed the above documentation for accuracy and completeness, and I agree with the above. -  Quillian Quince, MD

## 2021-11-14 ENCOUNTER — Other Ambulatory Visit: Payer: Self-pay

## 2021-11-14 ENCOUNTER — Ambulatory Visit (INDEPENDENT_AMBULATORY_CARE_PROVIDER_SITE_OTHER): Payer: BC Managed Care – PPO | Admitting: Family Medicine

## 2021-11-14 ENCOUNTER — Encounter (INDEPENDENT_AMBULATORY_CARE_PROVIDER_SITE_OTHER): Payer: Self-pay | Admitting: Family Medicine

## 2021-11-14 VITALS — BP 131/74 | HR 83 | Temp 98.0°F | Ht 67.0 in | Wt 342.0 lb

## 2021-11-14 DIAGNOSIS — E669 Obesity, unspecified: Secondary | ICD-10-CM | POA: Diagnosis not present

## 2021-11-14 DIAGNOSIS — Z6841 Body Mass Index (BMI) 40.0 and over, adult: Secondary | ICD-10-CM

## 2021-11-14 DIAGNOSIS — I10 Essential (primary) hypertension: Secondary | ICD-10-CM

## 2021-11-14 DIAGNOSIS — Z9189 Other specified personal risk factors, not elsewhere classified: Secondary | ICD-10-CM

## 2021-11-14 DIAGNOSIS — R7303 Prediabetes: Secondary | ICD-10-CM

## 2021-11-14 MED ORDER — OZEMPIC (0.25 OR 0.5 MG/DOSE) 2 MG/1.5ML ~~LOC~~ SOPN: 0.5000 mg | PEN_INJECTOR | SUBCUTANEOUS | 0 refills | Status: DC

## 2021-11-15 NOTE — Progress Notes (Signed)
Chief Complaint:   OBESITY Laura Knox is here to discuss her progress with her obesity treatment plan along with follow-up of her obesity related diagnoses. [name] is on the Category 4 Plan and states she is following her eating plan approximately 50% of the time. [name] states she is doing Geophysicist/field seismologist and water aerobics for 60 minutes 6 times per week.  Today's visit was #: 18 Starting weight: 378 lbs Starting date: 12/27/2020 Today's weight: 342 lbs Today's date: 11/14/2021 Total lbs lost to date: 36 Total lbs lost since last in-office visit: 1  Interim History: [name] continues to do well with weight loss, even over Christmas and New Years. She is staying active most days of the week.  Subjective:   1. Pre-diabetes [name] notes polyphagia has improved on Ozempic. She notes some nausea first thing in the morning, but this improves with food.  2. Essential hypertension [name]'s blood pressure is well controlled on her medications. She has questions about what time to take which medication.  3. At risk for diabetes mellitus [name] is at higher than average risk for developing diabetes due to obesity.   Assessment/Plan:   1. Pre-diabetes [name] will continue to work on weight loss, exercise, and decreasing simple carbohydrates to help decrease the risk of diabetes. We will refill Ozempic for 1 month.  - Semaglutide,0.25 or 0.5MG /DOS, (OZEMPIC, 0.25 OR 0.5 MG/DOSE,) 2 MG/1.5ML SOPN; Inject 0.5 mg into the skin once a week.  Dispense: 1.5 mL; Refill: 0  2. Essential hypertension [name] was recommended to take hydrochlorothiazide in the morning and losartan in the PM. She will continue with diet and exercise, and will continue to monitor as she continues her lifestyle modifications.  3. At risk for diabetes mellitus [name] was given approximately 15 minutes of diabetes education and counseling today. We discussed intensive lifestyle modifications today with an emphasis on weight loss as  well as increasing exercise and decreasing simple carbohydrates in her diet. We also reviewed medication options with an emphasis on risk versus benefit of those discussed.   Repetitive spaced learning was employed today to elicit superior memory formation and behavioral change.  4. Obesity BMI today is 53.6 [name] is currently in the action stage of change. As such, her goal is to continue with weight loss efforts. She has agreed to the Category 4 Plan.   Exercise goals: As is.  Behavioral modification strategies: increasing lean protein intake and meal planning and cooking strategies.  [name] has agreed to follow-up with our clinic in 3 weeks. She was informed of the importance of frequent follow-up visits to maximize her success with intensive lifestyle modifications for her multiple health conditions.   Objective:   Blood pressure 131/74, pulse 83, temperature 98 F (36.7 C), height 5\' 7"  (1.702 m), weight (!) 342 lb (155.1 kg), SpO2 97 %. Body mass index is 53.56 kg/m.  General: Cooperative, alert, well developed, in no acute distress. HEENT: Conjunctivae and lids unremarkable. Cardiovascular: Regular rhythm.  Lungs: Normal work of breathing. Neurologic: No focal deficits.   Lab Results  Component Value Date   CREATININE 0.70 06/18/2021   BUN 21 06/18/2021   NA 139 06/18/2021   K 4.2 06/18/2021   CL 102 06/18/2021   CO2 21 06/18/2021   Lab Results  Component Value Date   ALT 14 06/18/2021   AST 14 06/18/2021   ALKPHOS 109 06/18/2021   BILITOT 0.4 06/18/2021   Lab Results  Component Value Date   HGBA1C 5.9 (H)  06/18/2021   HGBA1C 5.9 02/06/2021   HGBA1C 6.0 (H) 12/27/2020   Lab Results  Component Value Date   INSULIN 16.7 06/18/2021   INSULIN 20.4 12/27/2020   Lab Results  Component Value Date   TSH 3.41 02/06/2021   Lab Results  Component Value Date   CHOL 141 12/27/2020   HDL 44 12/27/2020   LDLCALC 81 12/27/2020   TRIG 80 12/27/2020   Lab Results   Component Value Date   VD25OH 76.3 06/18/2021   VD25OH 53.2 02/06/2021   VD25OH 55.2 12/27/2020   Lab Results  Component Value Date   WBC 8.4 02/06/2021   HGB 13.8 02/06/2021   HCT 42 02/06/2021   MCV 87 12/27/2020   PLT 336 02/06/2021   Lab Results  Component Value Date   FERRITIN 88 02/06/2021   Attestation Statements:   Reviewed by clinician on day of visit: allergies, medications, problem list, medical history, surgical history, family history, social history, and previous encounter notes.   I, Burt Knack, am acting as transcriptionist for Quillian Quince, MD.  I have reviewed the above documentation for accuracy and completeness, and I agree with the above. -  Quillian Quince, MD

## 2021-11-18 ENCOUNTER — Encounter: Payer: Self-pay | Admitting: Medical

## 2021-11-19 MED ORDER — HYDROCHLOROTHIAZIDE 12.5 MG PO CAPS: ORAL_CAPSULE | ORAL | 3 refills | Status: DC

## 2021-11-19 MED ORDER — THYROID 120 MG PO TABS: 120.0000 mg | ORAL_TABLET | Freq: Every day | ORAL | 1 refills | Status: DC

## 2021-11-19 MED ORDER — LOSARTAN POTASSIUM 100 MG PO TABS: ORAL_TABLET | ORAL | 1 refills | Status: DC

## 2021-11-19 MED ORDER — ESTRADIOL 0.1 MG/24HR TD PTTW: 1.0000 | MEDICATED_PATCH | TRANSDERMAL | 2 refills | Status: DC

## 2021-11-20 ENCOUNTER — Other Ambulatory Visit: Payer: Self-pay | Admitting: Medical

## 2021-11-26 ENCOUNTER — Encounter (INDEPENDENT_AMBULATORY_CARE_PROVIDER_SITE_OTHER): Payer: Self-pay | Admitting: Family Medicine

## 2021-11-26 DIAGNOSIS — R7303 Prediabetes: Secondary | ICD-10-CM

## 2021-11-26 NOTE — Telephone Encounter (Signed)
Please see message and advise.  Thank you. ° °

## 2021-11-28 NOTE — Telephone Encounter (Signed)
Please see message and advise.  Thank you. Pt was rx'd to take, 0.5mg  of ozempic.

## 2021-11-28 NOTE — Telephone Encounter (Signed)
Ok to increase to 1mg  qweek x 1 month

## 2021-11-28 NOTE — Telephone Encounter (Signed)
Dr.Beasley 

## 2021-11-29 MED ORDER — SEMAGLUTIDE (1 MG/DOSE) 4 MG/3ML ~~LOC~~ SOPN
1.0000 mg | PEN_INJECTOR | SUBCUTANEOUS | 0 refills | Status: DC
Start: 2021-11-29 — End: 2021-12-05

## 2021-12-03 ENCOUNTER — Telehealth (INDEPENDENT_AMBULATORY_CARE_PROVIDER_SITE_OTHER): Payer: Self-pay

## 2021-12-03 NOTE — Telephone Encounter (Signed)
Pt called in and stated that she is waiting for a PA for her Ozempic. The pharmacy says that they are waiting to hear back from [place]. The pt is requested a updated on the PA.Please advise

## 2021-12-04 ENCOUNTER — Other Ambulatory Visit: Payer: Self-pay | Admitting: Medical

## 2021-12-04 NOTE — Telephone Encounter (Signed)
Will discuss during visit tomorrow.

## 2021-12-05 ENCOUNTER — Other Ambulatory Visit: Payer: Self-pay

## 2021-12-05 ENCOUNTER — Ambulatory Visit (INDEPENDENT_AMBULATORY_CARE_PROVIDER_SITE_OTHER): Payer: BC Managed Care – PPO | Admitting: Family Medicine

## 2021-12-05 ENCOUNTER — Encounter (INDEPENDENT_AMBULATORY_CARE_PROVIDER_SITE_OTHER): Payer: Self-pay | Admitting: Family Medicine

## 2021-12-05 VITALS — BP 124/79 | HR 73 | Temp 97.9°F | Ht 67.0 in | Wt 344.0 lb

## 2021-12-05 DIAGNOSIS — E669 Obesity, unspecified: Secondary | ICD-10-CM | POA: Diagnosis not present

## 2021-12-05 DIAGNOSIS — R7303 Prediabetes: Secondary | ICD-10-CM

## 2021-12-05 DIAGNOSIS — M170 Bilateral primary osteoarthritis of knee: Secondary | ICD-10-CM

## 2021-12-05 DIAGNOSIS — Z6841 Body Mass Index (BMI) 40.0 and over, adult: Secondary | ICD-10-CM

## 2021-12-05 DIAGNOSIS — Z9189 Other specified personal risk factors, not elsewhere classified: Secondary | ICD-10-CM

## 2021-12-05 MED ORDER — MELOXICAM 7.5 MG PO TABS: 7.5000 mg | ORAL_TABLET | Freq: Every day | ORAL | 0 refills | Status: DC

## 2021-12-05 MED ORDER — SEMAGLUTIDE (1 MG/DOSE) 4 MG/3ML ~~LOC~~ SOPN: 1.0000 mg | PEN_INJECTOR | SUBCUTANEOUS | 0 refills | Status: DC

## 2021-12-05 NOTE — Progress Notes (Signed)
Chief Complaint:   OBESITY Laura Knox is here to discuss her progress with her obesity treatment plan along with follow-up of her obesity related diagnoses. Laura Knox is on the Category 4 Plan and states she is following her eating plan approximately (unknown)% of the time. Laura Knox states she is Silver sneakers for 30 minutes 7 times per week.  Today's visit was #: 71 Starting weight: 378 lbs Starting date: 12/27/2020 Today's weight: 344 lbs Today's date: 12/05/2021 Total lbs lost to date: 34 Total lbs lost since last in-office visit: 0  Interim History: Meira is working on weight loss but she struggling recently and exercise is difficult due to pain especially bone spur in right heel.  Subjective:   1. Pre-diabetes Laura Knox had been on Ozempic previously, but she was recently denied which may have been an error.   2. Osteoarthritis of both knees, unspecified osteoarthritis type Laura Knox is with right foot bone spur. She is working on weight loss, but her pain is worsening and making exercise difficult.   3. At risk for activity intolerance Laura Knox is at risk for exercise intolerance due to pain.  Assessment/Plan:   1. Pre-diabetes Jaylan agreed to increase Ozempic to 1 mg weekly with no refills. She will continue to work on weight loss, exercise, and decreasing simple carbohydrates to help decrease the risk of diabetes.   - Semaglutide, 1 MG/DOSE, 4 MG/3ML SOPN; Inject 1 mg as directed once a week.  Dispense: 3 mL; Refill: 0  2. Osteoarthritis of both knees, unspecified osteoarthritis type Laura Knox agreed to start Mobic 7.5 mg q daily with no refills. We will refer to Dr. Georgina Snell for evaluation.   - meloxicam (MOBIC) 7.5 MG tablet; Take 1 tablet (7.5 mg total) by mouth daily.  Dispense: 30 tablet; Refill: 0 - Ambulatory referral to Sports Medicine  3. At risk for activity intolerance Laura Knox was given approximately 15 minutes of exercise intolerance counseling today. She is 56 y.o. female and has  risk factors exercise intolerance including obesity. We discussed intensive lifestyle modifications today with an emphasis on specific weight loss instructions and strategies. Laura Knox will slowly increase activity as tolerated.  Repetitive spaced learning was employed today to elicit superior memory formation and behavioral change.  4. Obesity BMI today is 53.9 Laura Knox is currently in the action stage of change. As such, her goal is to continue with weight loss efforts. She has agreed to change to following a lower carbohydrate, vegetable and lean protein rich diet plan.   Exercise goals: As is.  Behavioral modification strategies: increasing lean protein intake.  Laura Knox has agreed to follow-up with our clinic in 3 weeks. She was informed of the importance of frequent follow-up visits to maximize her success with intensive lifestyle modifications for her multiple health conditions.   Objective:   Blood pressure 124/79, pulse 73, temperature 97.9 F (36.6 C), height 5\' 7"  (1.702 m), weight (!) 344 lb (156 kg), SpO2 93 %. Body mass index is 53.88 kg/m.  General: Cooperative, alert, well developed, in no acute distress. HEENT: Conjunctivae and lids unremarkable. Cardiovascular: Regular rhythm.  Lungs: Normal work of breathing. Neurologic: No focal deficits.   Lab Results  Component Value Date   CREATININE 0.70 06/18/2021   BUN 21 06/18/2021   NA 139 06/18/2021   K 4.2 06/18/2021   CL 102 06/18/2021   CO2 21 06/18/2021   Lab Results  Component Value Date   ALT 14 06/18/2021   AST 14 06/18/2021   ALKPHOS 109 06/18/2021  BILITOT 0.4 06/18/2021   Lab Results  Component Value Date   HGBA1C 5.9 (H) 06/18/2021   HGBA1C 5.9 02/06/2021   HGBA1C 6.0 (H) 12/27/2020   Lab Results  Component Value Date   INSULIN 16.7 06/18/2021   INSULIN 20.4 12/27/2020   Lab Results  Component Value Date   TSH 3.41 02/06/2021   Lab Results  Component Value Date   CHOL 141 12/27/2020   HDL 44  12/27/2020   LDLCALC 81 12/27/2020   TRIG 80 12/27/2020   Lab Results  Component Value Date   VD25OH 76.3 06/18/2021   VD25OH 53.2 02/06/2021   VD25OH 55.2 12/27/2020   Lab Results  Component Value Date   WBC 8.4 02/06/2021   HGB 13.8 02/06/2021   HCT 42 02/06/2021   MCV 87 12/27/2020   PLT 336 02/06/2021   Lab Results  Component Value Date   FERRITIN 88 02/06/2021   Attestation Statements:   Reviewed by clinician on day of visit: allergies, medications, problem list, medical history, surgical history, family history, social history, and previous encounter notes.   I, Trixie Dredge, am acting as transcriptionist for Dennard Nip, MD.  I have reviewed the above documentation for accuracy and completeness, and I agree with the above. -  Dennard Nip, MD

## 2021-12-07 NOTE — Progress Notes (Signed)
I, Philbert Riser, LAT, ATC acting as a scribe for [name], MD.  Subjective:    CC: Bilat knee pain  HPI: Pt is a 56 y/o female c/o bilat knee ongoing for years, worsening recently. Pt locates pain to all over both knees. Pt reports that both knees are "bone on bone." Pt c/o increase stiffness in bilat knee when standing.  Knee swelling: unsure Mechanical symptoms: yes Aggravates: walking, standing,  Treatments tried: meloxicam, naproxen, weight loss 75lbs, Voltaren gel, Biofreeze  Pt also c/o R foot pain w/ a hx bone spur in her R foot. Pt locates pain to the posterior aspect of the R heel, slightly laterally.   Pertinent review of Systems: No fevers or chills  Relevant historical information: Obesity.  Hypertension.   Objective:    Vitals:   12/10/21 1328  BP: (!) 154/90  Pulse: 97  SpO2: 97%   General: Well Developed, well nourished, and in no acute distress.   MSK:  Knees bilaterally crepitation with extension.  Decreased range of motion.  Antalgic gait.  Right foot normal-appearing Tender palpation plantar and posterior calcaneus. Decreased foot and ankle motion.  Dorsiflexion is limited. Intact strength. Pulses capillary fill and sensation are intact distally.  Lab and Radiology Results  Diagnostic Limited MSK Ultrasound of: Right calcaneus Posterior calcaneus visualized.  Achilles tendon intact however significant calcifications within distal Achilles tendon as it inserts onto the calcaneus consistent with chronic calcific tendinopathy or Haglund deformity. No Achilles tendon tear is visible.  Plantar calcaneus visualized.  Plantar fascia intact.  Posterior calcaneal spur visible.  Plantar fascia is thickened measuring greater than 5 mm. 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 Plantar calcaneal heel spur present. No acute fractures are present. Await formal radiology review   Impression and Recommendations:    Assessment and Plan: 56 y.o. female with bilateral knee pain due to DJD.  Patient has reports of giving it bone-on-bone DJD.  She has had trials of failure of steroid injection and hyaluronic acid injections.  At this point I am not optimistic about injections helping very much.  I think her next best bet would be geniculate nerve ablation.  We could also consider PRP but I am not optimistic about this.  Ultimately knee replacement is her best option however she needs to lose a substantial amount of weight to get her BMI under 40.  Right foot and ankle pain.  Fundamentally main issue is Achilles tendinitis and Planter fasciitis.  Plan to maximize conservative management exercises heel cups and ice massage.  Recheck in 1 month.  PDMP not reviewed this encounter. Orders Placed This Encounter  Procedures   [place] LIMITED JOINT SPACE STRUCTURES LOW BILAT(NO LINKED CHARGES)    Standing Status:   Future    Number of Occurrences:   1    Standing Expiration Date:   06/06/2022    Order Specific Question:   Reason for Exam (SYMPTOM  OR DIAGNOSIS REQUIRED)    Answer:   bilateral knee pain    Order Specific Question:   Preferred imaging location?    Answer:   [facility]    Standing Status:   Future    Number of Occurrences:   1    Standing Expiration Date:   12/10/2022    Order Specific Question:   Reason for  Exam (SYMPTOM  OR DIAGNOSIS REQUIRED)    Answer:   right heel pain    Order Specific Question:   Is patient pregnant?    Answer:   No    Order Specific Question:   Preferred imaging location?    Answer:   [name]   Ambulatory referral to Physical Medicine Rehab    Referral Priority:   Routine    Referral Type:   Rehabilitation    Referral Reason:   Specialty Services Required     Referred to Provider:   Tyrell Antonio, MD    Requested Specialty:   Physical Medicine and Rehabilitation    Number of Visits Requested:   1   No orders of the defined types were placed in this encounter.   Discussed warning signs or symptoms. Please see discharge instructions. Patient expresses understanding.   The above documentation has been reviewed and is accurate and complete [name], M.D.

## 2021-12-10 ENCOUNTER — Ambulatory Visit: Payer: BC Managed Care – PPO | Admitting: Family Medicine

## 2021-12-10 ENCOUNTER — Ambulatory Visit (INDEPENDENT_AMBULATORY_CARE_PROVIDER_SITE_OTHER): Payer: BC Managed Care – PPO

## 2021-12-10 ENCOUNTER — Telehealth: Payer: Self-pay | Admitting: Physical Medicine and Rehabilitation

## 2021-12-10 ENCOUNTER — Other Ambulatory Visit: Payer: Self-pay

## 2021-12-10 ENCOUNTER — Ambulatory Visit: Payer: Self-pay

## 2021-12-10 VITALS — BP 154/90 | HR 97 | Ht 67.0 in | Wt 341.1 lb

## 2021-12-10 DIAGNOSIS — M25561 Pain in right knee: Secondary | ICD-10-CM | POA: Diagnosis not present

## 2021-12-10 DIAGNOSIS — M79671 Pain in right foot: Secondary | ICD-10-CM

## 2021-12-10 DIAGNOSIS — G8929 Other chronic pain: Secondary | ICD-10-CM | POA: Diagnosis not present

## 2021-12-10 DIAGNOSIS — M25562 Pain in left knee: Secondary | ICD-10-CM

## 2021-12-10 NOTE — Patient Instructions (Addendum)
Thank you for coming in today.   Please complete the exercises that the athletic trainer went over with you:  View at [email or web address] using code: [id number]  Geniculate Nerve Ablation.   Night Splint For Plantar Fasciitis.   Recheck back in 1 month

## 2021-12-10 NOTE — Telephone Encounter (Signed)
Patient called needing to schedule an appointment with Dr Alvester Morin for bil knee pain. The number to contact patient is [phone]

## 2021-12-11 NOTE — Progress Notes (Signed)
Heel spurs are present.  No fractures are present.

## 2021-12-14 ENCOUNTER — Encounter (INDEPENDENT_AMBULATORY_CARE_PROVIDER_SITE_OTHER): Payer: Self-pay | Admitting: Family Medicine

## 2021-12-17 ENCOUNTER — Encounter: Payer: Self-pay | Admitting: Physical Medicine and Rehabilitation

## 2021-12-17 ENCOUNTER — Ambulatory Visit: Payer: BC Managed Care – PPO | Admitting: Physical Medicine and Rehabilitation

## 2021-12-17 ENCOUNTER — Other Ambulatory Visit: Payer: Self-pay

## 2021-12-17 VITALS — BP 138/85 | HR 72

## 2021-12-17 DIAGNOSIS — M170 Bilateral primary osteoarthritis of knee: Secondary | ICD-10-CM | POA: Diagnosis not present

## 2021-12-17 DIAGNOSIS — G8929 Other chronic pain: Secondary | ICD-10-CM

## 2021-12-17 DIAGNOSIS — M25562 Pain in left knee: Secondary | ICD-10-CM

## 2021-12-17 DIAGNOSIS — Z6841 Body Mass Index (BMI) 40.0 and over, adult: Secondary | ICD-10-CM

## 2021-12-17 DIAGNOSIS — M25561 Pain in right knee: Secondary | ICD-10-CM

## 2021-12-17 NOTE — Progress Notes (Signed)
Laura Knox - 56 y.o. female MRN [id number]  Date of birth: 07-15-1966  Office Visit Note: Visit Date: 12/17/2021 PCP: Mackie Pai, PA-C Referred by: Elise Benne  Subjective: Chief Complaint  Patient presents with   Right Knee - Pain   Left Knee - Pain   HPI: Laura Knox is a 56 y.o. female who comes in today per the request of Dr. Lynne Leader for evaluation of bilateral knee pain. Patient reports pain has been ongoing for several years. Pain states she has "bone on bone" arthritis to both knees. States her pain is exacerbated by movement, activity and prolonged standing. She describes pain as a constant soreness and currently rates as 8 out of 10. Patient reports some relief of pain with home exercise regimen, sitting and use of Aleve and Mobic. Patient did bring in her bilateral knee x-rays from 2021 at [facility] and these images exhibit severe bilateral medial compartment osteoarthritis. Patient was previously treated at [facility] by [place] Tanner, [place] in 2021 and was told that she was not a candidate for surgical intervention due to increased BMI. Patient has history of multiple steroid injections and viscosupplementation injections to both knees without significant relief. Patient is currently being treated at [facility] by Dr. Dennard Nip and reports she has lost over 75 lbs. Patient states she is taking Ozempic and reports good success with this medication. Patient states she is frustrated because her pain makes it difficult to be active and exercise, however reports she does go to the gym several times a week. Patient was recently seen by Dr. Lynne Leader and was referred to [place] for genicular nerve blocks with possible radiofrequency ablation. Patient states she is very interested in this procedure and would like to move forward if warranted. Patient does have history of chronic right foot pain with bone spur and continues to work with Dr. Georgina Snell to manage this  issue. Patient denies focal weakness, numbness and tingling. Patient denies recent trauma or falls.      Review of Systems  Musculoskeletal:  Positive for joint pain.  Neurological:  Negative for tingling, sensory change, focal weakness and weakness.  All other systems reviewed and are negative. Otherwise per HPI.  Assessment & Plan: Visit Diagnoses:    ICD-10-CM   1. Chronic pain of left knee  M25.562 Ambulatory referral to Physical Medicine Rehab   G89.29     2. Chronic pain of right knee  M25.561 Ambulatory referral to Physical Medicine Rehab   G89.29     3. Bilateral primary osteoarthritis of knee  M17.0     4. Class 3 severe obesity with serious comorbidity and body mass index (BMI) of 60.0 to 69.9 in adult, unspecified obesity type ([place])  E66.01 Ambulatory referral to Physical Medicine Rehab   [phone]        Plan: Findings:  Chronic, worsening and severe bilateral knee pain. Patient continues to have excruciating and debilitating pain despite good conservative therapies such as home exercise regimen, rest and use of medications. Patients clinical presentation and exam are consistent with osteoarthritis. We feel the next step is to perform diagnostic and hopefully therapeutic right genicular nerve blocks under fluoroscopic guidance. If patient does well with right genicular nerve blocks we did speak with her about possibility of longer sustained pain relief with radiofrequency ablation. We did provide patient with educational material regarding radiofrequency ablation. We also discussed ablation procedure in detail and was able to answer any questions/concerns that she voiced. Patient encouraged  to remain active and to continue home exercise program. Patient reassured that she is doing well on her weight loss journey and we feel that we can get her in quickly for procedure. No red flag symptoms noted upon exam today.    Meds & Orders: No orders of the defined types were placed in this  encounter.   Orders Placed This Encounter  Procedures   Ambulatory referral to Physical Medicine Rehab    Follow-up: Return for Right genicular nerve block.   Procedures: No procedures performed      Clinical History: No specialty comments available.   She reports that she has never smoked. She has never used smokeless tobacco.  Recent Labs    12/27/20 0848 02/06/21 0000 04/13/21 1543 06/18/21 1237  HGBA1C 6.0* 5.9  --  5.9*  LABURIC  --   --  4.5  --     Objective:  VS:  HT:     WT:    BMI:      BP:138/85   HR:72bpm   TEMP: ( )   RESP:  Physical Exam Vitals and nursing note reviewed.  HENT:     Head: Normocephalic and atraumatic.     Right Ear: External ear normal.     Left Ear: External ear normal.     Nose: Nose normal.     Mouth/Throat:     Mouth: Mucous membranes are moist.  Eyes:     Pupils: Pupils are equal, round, and reactive to light.  Cardiovascular:     Rate and Rhythm: Normal rate.     Pulses: Normal pulses.  Pulmonary:     Effort: Pulmonary effort is normal.  Abdominal:     General: Abdomen is flat. There is no distension.  Musculoskeletal:        General: Tenderness present.     Cervical back: Normal range of motion.     Comments: Decreased range of motion, crepitus noted upon extension of bilateral knees. Tenderness noted to medial joint lines bilaterally upon palpation. Walks independently, gait steady.   Skin:    General: Skin is warm and dry.     Capillary Refill: Capillary refill takes less than 2 seconds.  Neurological:     General: No focal deficit present.     Mental Status: She is alert and oriented to person, place, and time.  Psychiatric:        Mood and Affect: Mood normal.        Behavior: Behavior normal.    Ortho Exam  Imaging: No results found.  Past Medical/Family/Surgical/Social History: Medications & Allergies reviewed per EMR, new medications updated. Patient Active Problem List   Diagnosis Date Noted   Special  screening for malignant neoplasms, colon    Rectal polyp    OSA (obstructive sleep apnea) 04/04/2021   Pre-diabetes 01/29/2021   Essential hypertension 01/29/2021   Class 3 severe obesity with serious comorbidity and body mass index (BMI) of 60.0 to 69.9 in adult [facility]) 01/29/2021   Past Medical History:  Diagnosis Date   B12 deficiency    Bilateral swelling of feet and ankles    Hypertension    Joint pain    Kidney stones    Knee pain    Osteoarthritis    Other fatigue    Prediabetes    Sleep apnea    Thyroid disease    Vitamin D deficiency    Family History  Problem Relation Age of Onset   Healthy Mother    Obesity  Mother    Cancer Father    Obesity Father    Cancer - Other Father    Stroke Maternal Grandmother    Lung cancer Maternal Grandfather    Past Surgical History:  Procedure Laterality Date   COLONOSCOPY WITH PROPOFOL N/A 04/10/2021   Procedure: COLONOSCOPY WITH PROPOFOL;  Surgeon: Jackquline Denmark, MD;  Location: [facility];  Service: Endoscopy;  Laterality: N/A;   KIDNEY STONE SURGERY     miniscus repair Left    POLYPECTOMY  04/10/2021   Procedure: POLYPECTOMY;  Surgeon: Jackquline Denmark, MD;  Location: [facility];  Service: Endoscopy;;   SEPTOPLASTY     Social History   Occupational History   Occupation: Retired  Tobacco Use   Smoking status: Never   Smokeless tobacco: Never  Vaping Use   Vaping Use: Never used  Substance and Sexual Activity   Alcohol use: Not Currently   Drug use: Never   Sexual activity: Yes    Partners: Male

## 2021-12-17 NOTE — Progress Notes (Signed)
Pt state both knee pain. Pt state walking, standing and sitting makes the pain worse. Pt state her left goes numb if she stands to long. Pt state she takes pain meds to help ease her pain.  Numeric Pain Rating Scale and Functional Assessment Average Pain 10 Pain Right Now 8 My pain is constant, sharp, burning, dull, stabbing, tingling, and aching Pain is worse with: walking, bending, sitting, standing, and some activites Pain improves with: rest and medication   In the last MONTH (on 0-10 scale) has pain interfered with the following?  1. General activity like being  able to carry out your everyday physical activities such as walking, climbing stairs, carrying groceries, or moving a chair?  Rating(7)  2. Relation with others like being able to carry out your usual social activities and roles such as  activities at home, at work and in your community. Rating(8)  3. Enjoyment of life such that you have  been bothered by emotional problems such as feeling anxious, depressed or irritable?  Rating(9)

## 2021-12-24 ENCOUNTER — Telehealth: Payer: Self-pay | Admitting: Physical Medicine and Rehabilitation

## 2021-12-24 ENCOUNTER — Encounter: Payer: Self-pay | Admitting: Physical Medicine and Rehabilitation

## 2021-12-24 NOTE — Telephone Encounter (Signed)
Patient called. She would like Shena to call her back to schedule an appointment with Dr. Ernestina Patches.

## 2021-12-26 ENCOUNTER — Encounter (INDEPENDENT_AMBULATORY_CARE_PROVIDER_SITE_OTHER): Payer: Self-pay | Admitting: Family Medicine

## 2021-12-26 ENCOUNTER — Ambulatory Visit (INDEPENDENT_AMBULATORY_CARE_PROVIDER_SITE_OTHER): Payer: BC Managed Care – PPO | Admitting: Family Medicine

## 2021-12-26 ENCOUNTER — Other Ambulatory Visit: Payer: Self-pay

## 2021-12-26 VITALS — BP 112/65 | HR 84 | Temp 98.4°F | Ht 67.0 in | Wt 336.0 lb

## 2021-12-26 DIAGNOSIS — R7303 Prediabetes: Secondary | ICD-10-CM | POA: Diagnosis not present

## 2021-12-26 DIAGNOSIS — E66813 Obesity, class 3: Secondary | ICD-10-CM

## 2021-12-26 DIAGNOSIS — I10 Essential (primary) hypertension: Secondary | ICD-10-CM | POA: Diagnosis not present

## 2021-12-26 DIAGNOSIS — Z6841 Body Mass Index (BMI) 40.0 and over, adult: Secondary | ICD-10-CM

## 2021-12-26 DIAGNOSIS — Z9189 Other specified personal risk factors, not elsewhere classified: Secondary | ICD-10-CM

## 2021-12-26 DIAGNOSIS — M170 Bilateral primary osteoarthritis of knee: Secondary | ICD-10-CM | POA: Diagnosis not present

## 2021-12-26 MED ORDER — MELOXICAM 7.5 MG PO TABS: 7.5000 mg | ORAL_TABLET | Freq: Every day | ORAL | 0 refills | Status: DC

## 2021-12-26 MED ORDER — WEGOVY 0.5 MG/0.5ML ~~LOC~~ SOAJ: 0.5000 mg | SUBCUTANEOUS | 0 refills | Status: DC

## 2021-12-26 MED ORDER — METFORMIN HCL 500 MG PO TABS: ORAL_TABLET | ORAL | 0 refills | Status: DC

## 2021-12-27 NOTE — Progress Notes (Signed)
? ? ? ?Chief Complaint:  ? ?OBESITY ?Laura Knox is here to discuss her progress with her obesity treatment plan along with follow-up of her obesity related diagnoses. Laura Knox is on following a lower carbohydrate, vegetable and lean protein rich diet plan and states she is following her eating plan approximately 50% of the time. Laura Knox states she is doing water aerobics for 60 minutes 3 times per week. ? ?Today's visit was #: 20 ?Starting weight: 378 lbs ?Starting date: 12/27/2020 ?Today's weight: 336 lbs ?Today's date: 12/26/2021 ?Total lbs lost to date: 75 ?Total lbs lost since last in-office visit: 8 ? ?Interim History: Laura Knox has done well with weight loss. She is concentrating on protein and limiting her sugar. She is struggling with right foot, and both knee pain. She saw Ortho since her last visit, and she is planning to have right genicular nerve block. She took Ozempic 0.25 mg to 0.5 mg, and she was prescribed Ozempic 1 mg but was unable to continue due to cost. ? ?Subjective:  ? ?1. Pre-diabetes ?Laura Knox's last A1c was 5.9. she was doing well with Ozempic but she is unable to continue due to cost. She is taking metformin 500 mg twice daily. She denies side effects. She reports polyphagia. ? ?2. Osteoarthritis of both knees, unspecified osteoarthritis type ?Laura Knox saw Ortho since her last visit. She is planning to have right genicular nerve block.  ? ?3. Essential hypertension ?Laura Knox's blood pressure looks great today. She is taking hydrochlorothiazide 12.5 mg and Cozaar 100 mg. She denies side effects. She denies chest pain, shortness of breath, or palpitations. ? ?4. At risk for diabetes mellitus ?Laura Knox is at higher than average risk for developing diabetes due to her obesity. ? ?Assessment/Plan:  ? ?1. Pre-diabetes ?We will refill metformin for 90 days with no refills. Laura Knox will start [facility] for weight loss, and will help with A1c control. She continue to work on dietary changes, exercise, weight loss, and decreasing  simple carbohydrates to help decrease the risk of diabetes.  ? ?- metFORMIN (GLUCOPHAGE) 500 MG tablet; 2 po with lunch qd  Dispense: 180 tablet; Refill: 0 ? ?2. Osteoarthritis of both knees, unspecified osteoarthritis type ?Laura Knox will continue to follow up with Ortho, and we will refill Mobic for 1 month. ? ?- meloxicam (MOBIC) 7.5 MG tablet; Take 1 tablet (7.5 mg total) by mouth daily.  Dispense: 30 tablet; Refill: 0 ? ?3. Essential hypertension ?Laura Knox will continue to follow up with her primary care provider, and continue her medications as directed. She will continue with healthy weight loss and exercise to improve blood pressure control. We will watch for signs of hypotension as she continues her lifestyle modifications. ? ?4. At risk for diabetes mellitus ?Laura Knox was given approximately 15 minutes of diabetic education and counseling today. We discussed intensive lifestyle modifications today with an emphasis on weight loss as well as increasing exercise and decreasing simple carbohydrates in her diet. We also reviewed medication options with an emphasis on risk versus benefits of those discussed. ? ?Repetitive spaced learning was employed today to elicit superior memory formation and behavioral change. ? ?5. Obesity with current  BMI of 52.7 ?Laura Knox is currently in the action stage of change. As such, her goal is to continue with weight loss efforts. She has agreed to following a lower carbohydrate, vegetable and lean protein rich diet plan.  ? ?We discussed various medication options to help Jayn with her weight loss efforts and we both agreed to start Wegovy 0.5 mg q  week with no refills. Side effects were discussed. Prior authorization needed. ? ?- Semaglutide-Weight Management (WEGOVY) 0.5 MG/0.5ML SOAJ; Inject 0.5 mg into the skin once a week.  Dispense: 2 mL; Refill: 0 ? ?Exercise goals: As is. ? ?Behavioral modification strategies: increasing lean protein intake, increasing water intake, no skipping  meals, and meal planning and cooking strategies. ? ?Laura Knox has agreed to follow-up with our clinic in 3 weeks. She was informed of the importance of frequent follow-up visits to maximize her success with intensive lifestyle modifications for her multiple health conditions.  ? ?Objective:  ? ?Blood pressure 112/65, pulse 84, temperature 98.4 ?F (36.9 ?C), height 5\' 7"  (1.702 m), weight (!) 336 lb (152.4 kg), SpO2 95 %. ?Body mass index is 52.63 kg/m?. ? ?General: Cooperative, alert, well developed, in no acute distress. ?HEENT: Conjunctivae and lids unremarkable. ?Cardiovascular: Regular rhythm.  ?Lungs: Normal work of breathing. ?Neurologic: No focal deficits.  ? ?Lab Results  ?Component Value Date  ? CREATININE 0.70 06/18/2021  ? BUN 21 06/18/2021  ? NA 139 06/18/2021  ? K 4.2 06/18/2021  ? CL 102 06/18/2021  ? CO2 21 06/18/2021  ? ?Lab Results  ?Component Value Date  ? ALT 14 06/18/2021  ? AST 14 06/18/2021  ? ALKPHOS 109 06/18/2021  ? BILITOT 0.4 06/18/2021  ? ?Lab Results  ?Component Value Date  ? HGBA1C 5.9 (H) 06/18/2021  ? HGBA1C 5.9 02/06/2021  ? HGBA1C 6.0 (H) 12/27/2020  ? ?Lab Results  ?Component Value Date  ? INSULIN 16.7 06/18/2021  ? INSULIN 20.4 12/27/2020  ? ?Lab Results  ?Component Value Date  ? TSH 3.41 02/06/2021  ? ?Lab Results  ?Component Value Date  ? CHOL 141 12/27/2020  ? HDL 44 12/27/2020  ? Swain 81 12/27/2020  ? TRIG 80 12/27/2020  ? ?Lab Results  ?Component Value Date  ? VD25OH 76.3 06/18/2021  ? VD25OH 53.2 02/06/2021  ? VD25OH 55.2 12/27/2020  ? ?Lab Results  ?Component Value Date  ? WBC 8.4 02/06/2021  ? HGB 13.8 02/06/2021  ? HCT 42 02/06/2021  ? MCV 87 12/27/2020  ? PLT 336 02/06/2021  ? ?Lab Results  ?Component Value Date  ? FERRITIN 88 02/06/2021  ? ?Attestation Statements:  ? ?Reviewed by clinician on day of visit: allergies, medications, problem list, medical history, surgical history, family history, social history, and previous encounter notes. ? ? ?I, Trixie Dredge, am acting  as transcriptionist for Dennard Nip, MD. ? ?I have reviewed the above documentation for accuracy and completeness, and I agree with the above. -  Dennard Nip, MD ? ? ?

## 2022-01-03 ENCOUNTER — Other Ambulatory Visit: Payer: Self-pay

## 2022-01-03 ENCOUNTER — Ambulatory Visit (INDEPENDENT_AMBULATORY_CARE_PROVIDER_SITE_OTHER): Payer: BC Managed Care – PPO | Admitting: Physical Medicine and Rehabilitation

## 2022-01-03 ENCOUNTER — Encounter: Payer: Self-pay | Admitting: Physical Medicine and Rehabilitation

## 2022-01-03 ENCOUNTER — Ambulatory Visit: Payer: Self-pay

## 2022-01-03 DIAGNOSIS — G8929 Other chronic pain: Secondary | ICD-10-CM

## 2022-01-03 DIAGNOSIS — M25561 Pain in right knee: Secondary | ICD-10-CM

## 2022-01-03 NOTE — Progress Notes (Signed)
Pt state right knee pain. Pt state walking, standing and sitting makes the pain worse. Pt state her left goes numb if she stands to long. Pt state she takes pain meds to help ease her pain. ? ?Numeric Pain Rating Scale and Functional Assessment ?Average Pain 2 ? ? ?In the last MONTH (on 0-10 scale) has pain interfered with the following? ? ?1. General activity like being  able to carry out your everyday physical activities such as walking, climbing stairs, carrying groceries, or moving a chair?  ?Rating(8) ? ? ? -BT, -Dye Allergies. ? ?

## 2022-01-07 ENCOUNTER — Ambulatory Visit: Payer: BC Managed Care – PPO | Admitting: Family Medicine

## 2022-01-08 ENCOUNTER — Other Ambulatory Visit: Payer: Self-pay

## 2022-01-08 ENCOUNTER — Encounter: Payer: Self-pay | Admitting: Physical Medicine and Rehabilitation

## 2022-01-08 ENCOUNTER — Encounter: Payer: Self-pay | Admitting: Family Medicine

## 2022-01-08 ENCOUNTER — Ambulatory Visit: Payer: BC Managed Care – PPO | Admitting: Family Medicine

## 2022-01-08 VITALS — BP 120/80 | HR 80 | Ht 67.0 in | Wt 339.4 lb

## 2022-01-08 DIAGNOSIS — M25561 Pain in right knee: Secondary | ICD-10-CM | POA: Diagnosis not present

## 2022-01-08 DIAGNOSIS — M79671 Pain in right foot: Secondary | ICD-10-CM

## 2022-01-08 DIAGNOSIS — M722 Plantar fascial fibromatosis: Secondary | ICD-10-CM | POA: Insufficient documentation

## 2022-01-08 DIAGNOSIS — G8929 Other chronic pain: Secondary | ICD-10-CM

## 2022-01-08 NOTE — Patient Instructions (Addendum)
Good to see you today. ? ?I've referred you to Dr. Jordan Likes for Shockwave therapy.  His office will call you to schedule. ? ?You can wear a Cam walker boot as needed that you can get at [facility] supply or on [employer]. ? ?Follow-up: as needed ?

## 2022-01-08 NOTE — Progress Notes (Signed)
? ?  I, Laura Knox, LAT, ATC, am serving as scribe for Dr. Lynne Leader. ? ?Laura Knox is a 56 y.o. female who presents to [place] [facility] today for f/u chronic bilat knee pain and R Achilles tendinitis/plantar fasciitis. Pt was last seen by Dr. Georgina Snell on 12/10/21 and she was referred to Dr. Ernestina Patches for genicular nerve ablation and had the procedure on her R knee on 01/03/22. Pt was also advised to work on HEP, use heel cups, ice massage, and night splints. Today, pt reports that her R knee feels horrible at the moment although she did note 75% relief at the time of the nerve block.  She states that after the block wore off, her knee feels worse than it did prior to the procedure.  She also notes that her R foot is "as bad as it's ever been."  She purchased a night splint but is unable to use it as it keeps her up at night.  She is doing her HEP fairly consistently but can't do the heel lowers unless she's in the water. ? ?Dx imaging: 12/10/21 R Os calcis XR ? ?Pertinent review of systems: No fevers or chills ? ?Relevant historical information: Hypertension and obesity. ? ? ?Exam:  ?BP 120/80 (BP Location: Right Arm, Patient Position: Sitting, Cuff Size: Large)   Pulse 80   Ht 5\' 7"  (1.702 m)   Wt (!) 339 lb 6.4 oz (154 kg)   SpO2 96%   BMI 53.16 kg/m?  ?General: Well Developed, well nourished, and in no acute distress.  ? ?MSK: Right knee: Effusion and tender to palpation.  Decreased range of motion. ? ?Right heel: Antalgic gait. ? ? ? ? ? ? ?Assessment and Plan: ?56 y.o. female with bilateral knee pain due to DJD.  In the process of getting geniculate nerve blocks in preparation for geniculate nerve ablation.  She had great response to the initial block and will be in preparation for the second block in preparation for the ablation. ? ?Additionally she has right planter fasciitis that is difficult to control.  She is failing typical conservative management.  At this point would  recommend trial of extracorporeal shockwave therapy.  Refer to Dr. Clearance Coots to proceed with this.  If this does not work plan for injection. ? ? ?PDMP not reviewed this encounter. ?Orders Placed This Encounter  ?Procedures  ? Ambulatory referral to Sports Medicine  ?  Referral Priority:   Routine  ?  Referral Type:   Consultation  ?  Referred to Provider:   Rosemarie Ax, MD  ?  Number of Visits Requested:   1  ? ?No orders of the defined types were placed in this encounter. ? ? ? ?Discussed warning signs or symptoms. Please see discharge instructions. Patient expresses understanding. ? ? ?The above documentation has been reviewed and is accurate and complete Lynne Leader, M.D. ? ? ?

## 2022-01-09 MED ORDER — BUPIVACAINE HCL 0.5 % IJ SOLN
5.0000 mL | INTRAMUSCULAR | Status: AC | PRN
  Administered 2022-01-03: 5 mL via INTRA_ARTICULAR

## 2022-01-09 NOTE — Progress Notes (Signed)
? ?Laura Knox - 56 y.o. female MRN [id number]  Date of birth: 16-Nov-1965 ? ?Office Visit Note: ?Visit Date: 01/03/2022 ?PCP: Esperanza Richters, PA-C ?Referred by: Esperanza Richters, PA-C ? ?Subjective: ?Chief Complaint  ?Patient presents with  ? Right Knee - Pain  ? ?HPI:  Laura Knox is a 56 y.o. female who comes in today at the request of Ellin Goodie, FNP and Dr. [name] for planned Right  Genicular nerve block block with fluoroscopic guidance.  The patient has failed conservative care including injections, home exercise, medications, time and activity modification.  Nerve block will be performed in a double block paradigm with goal towards radiofrequency ablation of the same genicular nerves for longer term definitive care.  Please see requesting physician notes for further details and justification.  ? ?ROS Otherwise per HPI. ? ?Assessment & Plan: ?Visit Diagnoses:  ?  ICD-10-CM   ?1. Chronic pain of right knee  M25.561 XR C-ARM NO REPORT  ? G89.29   ?  ?  ?Plan: No additional findings.  ? ?Meds & Orders: No orders of the defined types were placed in this encounter. ?  ?Orders Placed This Encounter  ?Procedures  ? Large Joint Inj  ? XR C-ARM NO REPORT  ?  ?Follow-up: Return for Review Pain Diary.  ? ?Procedures: ?Genicular Nerve Block R knee on 01/03/2022 2:00 PM ?Indications: pain and diagnostic evaluation ?Details: 25 G 3.5 in needle, fluoroscopy-guided anterior approach ?Medications: 5 mL bupivacaine 0.5 % ?Outcome: tolerated well, no immediate complications ? ?Genicular Nerve Blockade with Fluoroscopic Guidance ? ?Patient: Laura Knox      ?Date of Birth: [birth year] ?MRN: [id number] ?PCP: Esperanza Richters, PA-C      ? ?  ?Universal Protocol:    ?Date/Time: 03/15/235:36 AM ? ?Consent Given By: the patient ? ?Position: SUPINE ? ?Additional Comments: ?Vital signs were monitored before and after the procedure. ?Patient was prepped and draped in the usual sterile fashion. ?The correct patient, procedure, and site was  verified. ? ? ?Injection Procedure Details:  ?Procedure Site One ?Meds Administered: No orders of the defined types were placed in this encounter. ?  ? ?Laterality: Right ? ?Location/Site:  ?Superior medial, superior lateral and inferior medial genicular nerve ? ?Needle size: 3.5 inch 25-gauge spinal needle ? ?Needle Placement: Just posterior to midline on the lateral view at the locations noted in the procedure note. ? ? ?Findings: ?  ? -Comments: Excellent position of the needle tip using biplanar imaging with low contrast showing no vascular flow. ? ?Procedure Details: ?The fluoroscope was positioned in a 90 degree anteroposterior (AP) fluoroscopic view and then tilted to obtain a true AP view of the knee joint.  The target areas are the mid-point of the curve formed where the femoral shaft and epicondyles meet as well as the midpoint of the curve on the medial side where the tibial shaft meets the plateau.  Using fluoroscopic guidance the target areas were infiltrated using a 27-gauge needle and 1-2 mL of 1% lidocaine without epinephrine to provide local anesthetic.  Then, for each target area a 3-1/2 inch 25-gauge spinal needle was driven down under intermittent fluoroscopic guidance to the periosteum of the target site.  Once all 3 needles were in place in the AP view, a lateral view was obtained at each needle was driven control to the midpoint of the shafts respectively.  A spot film was obtained of the lateral view and saved.  Next, an AP view was obtained once again to confirm placement.  A spot film then was obtained and saved. ? ?After fluoroscopic imaging was used to determine and confirm placement, 0.5-1 mL of injectate was delivered at each target location.  The patient was given a pain diary to complete and return to the office.  ? ? ? ? ?Additional Comments:  ?No complications occurred ?Dressing: Band-Aid ?  ? ?Post-procedure details: ?Patient was observed during the procedure. ?Post-procedure  instructions were reviewed. ? ?Patient left the clinic in stable condition. ? ? ? ? ? ? ?  ?Consent was given by the patient. Immediately prior to procedure a time out was called to verify the correct patient, procedure, equipment, support staff and site/side marked as required. Patient was prepped and draped in the usual sterile fashion.  ? ?  ?   ? ?Clinical History: ?No specialty comments available.  ? ? ? ?Objective:  VS:  HT:    WT:   BMI:     BP:   HR: bpm  TEMP: ( )  RESP:  ?Physical Exam  ? ?Imaging: ?No results found. ?

## 2022-01-10 ENCOUNTER — Other Ambulatory Visit: Payer: Self-pay | Admitting: Physical Medicine and Rehabilitation

## 2022-01-10 NOTE — Progress Notes (Signed)
Patient received greater than 75% pain relief with first right genicular nerve block. Will schedule second block. ?

## 2022-01-11 ENCOUNTER — Telehealth: Payer: Self-pay | Admitting: Family Medicine

## 2022-01-11 NOTE — Telephone Encounter (Signed)
Dr. Alvester Morin is okay with you schedule with me 1 day after your second geniculate nerve block for a cortisone shot in your knee. ?

## 2022-01-11 NOTE — Telephone Encounter (Signed)
-----   Message from Tyrell Antonio, MD sent at 01/08/2022  2:12 PM EDT ----- ?Regarding: RE: steroid injection after block in prep for ablation ?Yes have at it. I think 2nd block just needs to be diagnostic. We are getting a schedule change set up to try to free up slots for RFA as they run in spurts. Try to get them in quicker. ? ? ?----- Message ----- ?From: Rodolph Bong, MD ?Sent: 01/08/2022   1:28 PM EDT ?To: Tyrell Antonio, MD ?Subject: steroid injection after block in prep for ab# ? ?I just saw Salvadore Dom.  ?She did great with the block for the geneiculate nerve ablation on 3/9 but had a lot of pain after it wore off.  ?Is there a role for a steroid injection after the 2nd block? ?If so would you do that or would you like me to do that? ?I am happy to do whatever makes sense? ?I think if we can prevent another pain flair after the 2nd block that would be great.  ? ?It looks like she may be a good candidate for ablation so fingers crossed.  ? ?Cydnee Fuquay ? ? ?

## 2022-01-13 NOTE — Progress Notes (Deleted)
? ?ANNUAL EXAM ?Patient name: Sneha Willig MRN [id number]  Date of birth: 03-17-1966 ?Chief Complaint:   ?No chief complaint on file. ? ?History of Present Illness:   ?Karessa Onorato is a 56 y.o. G0P0000 female being seen today for a routine annual exam.  ?Current complaints: *** ? ?No LMP recorded. Patient is postmenopausal. ? ? ?The pregnancy intention screening data noted above was reviewed. Potential methods of contraception were discussed. The patient elected to proceed with No data recorded.  ? ?Last pap Not done. H/O abnormal pap: {yes/yes***/no:[id number]} ?Health Maintenance Due  ?Topic Date Due  ? TETANUS/TDAP  Never done  ? PAP SMEAR-Modifier  Never done  ? MAMMOGRAM  Never done  ? Zoster Vaccines- Shingrix (1 of 2) Never done  ? COVID-19 Vaccine (5 - Booster for Pfizer series) 04/12/2021  ? INFLUENZA VACCINE  05/28/2021  ?Colonoscopy 03/2021 ? ? ?Depression screen [facility] 2/9 12/27/2020  ?Decreased Interest 3  ?Down, Depressed, Hopeless 2  ?PHQ - 2 Score 5  ?Altered sleeping 3  ?Tired, decreased energy 3  ?Change in appetite 3  ?Feeling bad or failure about yourself  3  ?Trouble concentrating 0  ?Moving slowly or fidgety/restless 3  ?Suicidal thoughts 0  ?PHQ-9 Score 20  ?Difficult doing work/chores Not difficult at all  ? ? No flowsheet data found. ? ? ?Review of Systems:   ?Pertinent items are noted in HPI ?Denies any headaches, blurred vision, fatigue, shortness of breath, chest pain, abdominal pain, abnormal vaginal discharge/itching/odor/irritation, problems with periods, bowel movements, urination, or intercourse unless otherwise stated above. *** ?Pertinent History Reviewed:  ?Reviewed past medical,surgical, social and family history.  ?Reviewed problem list, medications and allergies. ?Physical Assessment:  ?There were no vitals filed for this visit.There is no height or weight on file to calculate BMI. ?  ?Physical Examination:  ?General appearance - well appearing, and in no distress ?Mental status - alert,  oriented to person, place, and time ?Psych:  She has a normal mood and affect ?Skin - warm and dry, normal color, no suspicious lesions noted ?Chest - effort normal, all lung fields clear to auscultation bilaterally ?Heart - normal rate and regular rhythm ?Neck:  midline trachea, no thyromegaly or nodules ?Breasts - breasts appear normal, no suspicious masses, no skin or nipple changes or axillary nodes ?Abdomen - soft, nontender, nondistended, no masses or organomegaly ?Pelvic -  ?VULVA: normal appearing vulva with no masses, tenderness or lesions   ?VAGINA: normal appearing vagina with normal color and discharge, no lesions   ?CERVIX: normal appearing cervix without discharge or lesions, no CMT ?UTERUS: uterus is felt to be normal size, shape, consistency and nontender  ?ADNEXA: No adnexal masses or tenderness noted. ?Extremities:  No swelling or varicosities noted ? ?Chaperone present for exam ? ?No results found for this or any previous visit (from the past 24 hour(s)).  ?Assessment & Plan:  ?Diagnoses and all orders for this visit: ? ?Encounter for annual routine gynecological examination ? ?- Cervical cancer screening: Discussed guidelines. Pap with HPV done ?- Breast Health: Encouraged self breast awareness/SBE. Discussed limits of clinical breast exam for detecting breast cancer. Discussed importance of annual MXR.  Rx given ?- Climacteric/Sexual health: Reviewed typical and atypical symptoms of menopause/peri-menopause. Discussed PMB and to call if any amount of spotting.  ?- Bone Health: Calcium via diet and supplementation. Discussed weight bearing exercise. DEXA due *** ?- Colonoscopy: up to date ?- F/U 12 months and prn  ? ? ? ? ?No orders of the  defined types were placed in this encounter. ? ? ?Meds: No orders of the defined types were placed in this encounter. ? ? ?Follow-up: No follow-ups on file. ? ?Milas Hock, MD ?01/13/2022 ?3:57 PM ?

## 2022-01-16 ENCOUNTER — Encounter (INDEPENDENT_AMBULATORY_CARE_PROVIDER_SITE_OTHER): Payer: Self-pay | Admitting: Family Medicine

## 2022-01-16 ENCOUNTER — Ambulatory Visit (INDEPENDENT_AMBULATORY_CARE_PROVIDER_SITE_OTHER): Payer: BC Managed Care – PPO | Admitting: Family Medicine

## 2022-01-16 ENCOUNTER — Ambulatory Visit (INDEPENDENT_AMBULATORY_CARE_PROVIDER_SITE_OTHER): Payer: Self-pay | Admitting: Family Medicine

## 2022-01-16 ENCOUNTER — Encounter: Payer: Self-pay | Admitting: Family Medicine

## 2022-01-16 ENCOUNTER — Other Ambulatory Visit: Payer: Self-pay

## 2022-01-16 VITALS — BP 125/80 | HR 77 | Ht 67.0 in | Wt 330.0 lb

## 2022-01-16 DIAGNOSIS — E669 Obesity, unspecified: Secondary | ICD-10-CM | POA: Diagnosis not present

## 2022-01-16 DIAGNOSIS — M170 Bilateral primary osteoarthritis of knee: Secondary | ICD-10-CM

## 2022-01-16 DIAGNOSIS — M722 Plantar fascial fibromatosis: Secondary | ICD-10-CM

## 2022-01-16 DIAGNOSIS — I10 Essential (primary) hypertension: Secondary | ICD-10-CM | POA: Diagnosis not present

## 2022-01-16 DIAGNOSIS — R7303 Prediabetes: Secondary | ICD-10-CM

## 2022-01-16 DIAGNOSIS — Z6841 Body Mass Index (BMI) 40.0 and over, adult: Secondary | ICD-10-CM

## 2022-01-16 DIAGNOSIS — Z9189 Other specified personal risk factors, not elsewhere classified: Secondary | ICD-10-CM

## 2022-01-16 MED ORDER — MELOXICAM 7.5 MG PO TABS: 7.5000 mg | ORAL_TABLET | Freq: Every day | ORAL | 0 refills | Status: DC

## 2022-01-16 NOTE — Progress Notes (Signed)
?  Laura Knox - 56 y.o. female MRN [id number]  Date of birth: [birth year] ? ?SUBJECTIVE:  Including CC & ROS.  ?No chief complaint on file. ? ? ?Laura Knox is a 56 y.o. female that is here for shockwave therapy. ? ? ?Review of Systems ?See HPI  ? ?HISTORY: Past Medical, Surgical, Social, and Family History Reviewed & Updated per EMR.   ?Pertinent Historical Findings include: ? ?Past Medical History:  ?Diagnosis Date  ? B12 deficiency   ? Bilateral swelling of feet and ankles   ? Hypertension   ? Joint pain   ? Kidney stones   ? Knee pain   ? Osteoarthritis   ? Other fatigue   ? Prediabetes   ? Sleep apnea   ? Thyroid disease   ? Vitamin D deficiency   ? ? ?Past Surgical History:  ?Procedure Laterality Date  ? COLONOSCOPY WITH PROPOFOL N/A 04/10/2021  ? Procedure: COLONOSCOPY WITH PROPOFOL;  Surgeon: Jackquline Denmark, MD;  Location: [facility];  Service: Endoscopy;  Laterality: N/A;  ? KIDNEY STONE SURGERY    ? miniscus repair Left   ? POLYPECTOMY  04/10/2021  ? Procedure: POLYPECTOMY;  Surgeon: Jackquline Denmark, MD;  Location: [facility];  Service: Endoscopy;;  ? SEPTOPLASTY    ? ? ? ?PHYSICAL EXAM:  ?VS: Ht 5\' 7"  (1.702 m)   Wt (!) 339 lb (153.8 kg)   BMI 53.09 kg/m?  ?Physical Exam ?Gen: NAD, alert, cooperative with exam, well-appearing ?MSK:  ?Neurovascularly intact   ? ?ECSWT Note ?Laura Knox ?04/28/1966 ? ?Procedure: ECSWT ?Indications: right foot and ankle pain  ? ?Procedure Details ?Consent: Risks of procedure as well as the alternatives and risks of each were explained to the (patient/caregiver).  Consent for procedure obtained. ?Time Out: Verified patient identification, verified procedure, site/side was marked, verified correct patient position, special equipment/implants available, medications/allergies/relevent history reviewed, required imaging and test results available.  Performed.  The area was cleaned with iodine and alcohol swabs.   ? ?The right foot and ankle was targeted for Extracorporeal shockwave  therapy.  ? ?Preset: achillodynia  ?Power Level: 40 ?Frequency: 10 ?Impulse/cycles: 3000 ?Head size: medium  ?Session: 1st ? ?Patient did tolerate procedure well. ? ? ? ?ASSESSMENT & PLAN:  ? ?Plantar fasciitis of right foot ?Completed shockwave therapy ? ? ? ? ?

## 2022-01-16 NOTE — Assessment & Plan Note (Signed)
Completed shockwave therapy  

## 2022-01-17 ENCOUNTER — Encounter: Payer: BC Managed Care – PPO | Admitting: Obstetrics and Gynecology

## 2022-01-17 DIAGNOSIS — Z01419 Encounter for gynecological examination (general) (routine) without abnormal findings: Secondary | ICD-10-CM

## 2022-01-18 NOTE — Progress Notes (Signed)
? ? ? ?Chief Complaint:  ? ?OBESITY ?Kenyon is here to discuss her progress with her obesity treatment plan along with follow-up of her obesity related diagnoses. Lamerle is on following a lower carbohydrate, vegetable and lean protein rich diet plan and states she is following her eating plan approximately 50% of the time. Kadeisha states she is in the pool for 60 minutes 3-4 times per week. ? ?Today's visit was #: 21 ?Starting weight: 378 lbs ?Starting date: 12/27/2020 ?Today's weight: 330 lbs ?Today's date: 01/16/2022 ?Total lbs lost to date: 48 ?Total lbs lost since last in-office visit: 6 ? ?Interim History: Mariajose continues to do very well with weight loss. She is working on simple carbohydrates and increase vegetables and protein. She is working on her foot and knee pain so she can start to increase her exercise. ? ?Subjective:  ? ?1. Pre-diabetes ?Casilda is on Wegovy and she notes decreased polyphagia. She has nausea but she feels this is not related to Ozempic.  ? ?2. Essential hypertension ?Ayanah's blood pressure is well controlled with diet and weight loss. She denies chest pain or headache. ? ?3. Osteoarthritis of both knees, unspecified osteoarthritis type ?Chloris is stable on Mobic. She is working with Sports Medicine to help with pain, and decrease the need for medications. She is taking it with a full meal.  ? ?4. At risk for heart disease ?Desi is at higher than average risk for cardiovascular disease due to obesity. ? ?Assessment/Plan:  ? ?1. Pre-diabetes ?Khamiyah will continue Wegovy 0.5 mg q week, and we will refill for 1 month. We will recheck labs at her next visit.   ? ?2. Essential hypertension ?Parish will continues with diet and exercise, and we will continue to monitor. ? ?3. Osteoarthritis of both knees, unspecified osteoarthritis type ?Makenleigh will continue Mobic, and we will refill for 1 month. ? ?- meloxicam (MOBIC) 7.5 MG tablet; Take 1 tablet (7.5 mg total) by mouth daily.  Dispense: 30 tablet;  Refill: 0 ? ?4. At risk for heart disease ?Shonika was given approximately 15 minutes of coronary artery disease prevention counseling today. She is 56 y.o. female and has risk factors for heart disease including obesity. We discussed intensive lifestyle modifications today with an emphasis on specific weight loss instructions and strategies. ? ?Repetitive spaced learning was employed today to elicit superior memory formation and behavioral change.  ? ?5. Obesity with current  BMI of 51.7 ?Timesha is currently in the action stage of change. As such, her goal is to continue with weight loss efforts. She has agreed to following a lower carbohydrate, vegetable and lean protein rich diet plan.  ? ?Exercise goals: As is. ? ?Behavioral modification strategies: increasing lean protein intake. ? ?Rosario has agreed to follow-up with our clinic in 3 weeks. She was informed of the importance of frequent follow-up visits to maximize her success with intensive lifestyle modifications for her multiple health conditions.  ? ?Objective:  ? ?Blood pressure 125/80, pulse 77, height 5\' 7"  (1.702 m), weight (!) 330 lb (149.7 kg), SpO2 96 %. ?Body mass index is 51.69 kg/m?. ? ?General: Cooperative, alert, well developed, in no acute distress. ?HEENT: Conjunctivae and lids unremarkable. ?Cardiovascular: Regular rhythm.  ?Lungs: Normal work of breathing. ?Neurologic: No focal deficits.  ? ?Lab Results  ?Component Value Date  ? CREATININE 0.70 06/18/2021  ? BUN 21 06/18/2021  ? NA 139 06/18/2021  ? K 4.2 06/18/2021  ? CL 102 06/18/2021  ? CO2 21 06/18/2021  ? ?Lab  Results  ?Component Value Date  ? ALT 14 06/18/2021  ? AST 14 06/18/2021  ? ALKPHOS 109 06/18/2021  ? BILITOT 0.4 06/18/2021  ? ?Lab Results  ?Component Value Date  ? HGBA1C 5.9 (H) 06/18/2021  ? HGBA1C 5.9 02/06/2021  ? HGBA1C 6.0 (H) 12/27/2020  ? ?Lab Results  ?Component Value Date  ? INSULIN 16.7 06/18/2021  ? INSULIN 20.4 12/27/2020  ? ?Lab Results  ?Component Value Date  ? TSH  3.41 02/06/2021  ? ?Lab Results  ?Component Value Date  ? CHOL 141 12/27/2020  ? HDL 44 12/27/2020  ? LDLCALC 81 12/27/2020  ? TRIG 80 12/27/2020  ? ?Lab Results  ?Component Value Date  ? VD25OH 76.3 06/18/2021  ? VD25OH 53.2 02/06/2021  ? VD25OH 55.2 12/27/2020  ? ?Lab Results  ?Component Value Date  ? WBC 8.4 02/06/2021  ? HGB 13.8 02/06/2021  ? HCT 42 02/06/2021  ? MCV 87 12/27/2020  ? PLT 336 02/06/2021  ? ?Lab Results  ?Component Value Date  ? FERRITIN 88 02/06/2021  ? ?Attestation Statements:  ? ?Reviewed by clinician on day of visit: allergies, medications, problem list, medical history, surgical history, family history, social history, and previous encounter notes. ? ? ?I, Burt Knack, am acting as transcriptionist for Quillian Quince, MD. ? ?I have reviewed the above documentation for accuracy and completeness, and I agree with the above. -  Quillian Quince, MD ? ? ?

## 2022-01-21 MED ORDER — WEGOVY 0.5 MG/0.5ML ~~LOC~~ SOAJ: 0.5000 mg | SUBCUTANEOUS | 0 refills | Status: DC

## 2022-01-22 ENCOUNTER — Ambulatory Visit (INDEPENDENT_AMBULATORY_CARE_PROVIDER_SITE_OTHER): Payer: Self-pay | Admitting: Family Medicine

## 2022-01-22 ENCOUNTER — Encounter: Payer: Self-pay | Admitting: Family Medicine

## 2022-01-22 DIAGNOSIS — M722 Plantar fascial fibromatosis: Secondary | ICD-10-CM

## 2022-01-22 NOTE — Progress Notes (Signed)
?  Laura Knox - 56 y.o. female MRN [id number]  Date of birth: [birth year] ? ?SUBJECTIVE:  Including CC & ROS.  ?No chief complaint on file. ? ? ?Laura Knox is a 56 y.o. female that is  here for shockwave therapy. ? ? ? ?Review of Systems ?See HPI  ? ?HISTORY: Past Medical, Surgical, Social, and Family History Reviewed & Updated per EMR.   ?Pertinent Historical Findings include: ? ?Past Medical History:  ?Diagnosis Date  ? B12 deficiency   ? Bilateral swelling of feet and ankles   ? Hypertension   ? Joint pain   ? Kidney stones   ? Knee pain   ? Osteoarthritis   ? Other fatigue   ? Prediabetes   ? Sleep apnea   ? Thyroid disease   ? Vitamin D deficiency   ? ? ?Past Surgical History:  ?Procedure Laterality Date  ? COLONOSCOPY WITH PROPOFOL N/A 04/10/2021  ? Procedure: COLONOSCOPY WITH PROPOFOL;  Surgeon: Jackquline Denmark, MD;  Location: [facility];  Service: Endoscopy;  Laterality: N/A;  ? KIDNEY STONE SURGERY    ? miniscus repair Left   ? POLYPECTOMY  04/10/2021  ? Procedure: POLYPECTOMY;  Surgeon: Jackquline Denmark, MD;  Location: [facility];  Service: Endoscopy;;  ? SEPTOPLASTY    ? ? ? ?PHYSICAL EXAM:  ?VS: Ht 5\' 7"  (1.702 m)   Wt (!) 330 lb (149.7 kg)   BMI 51.69 kg/m?  ?Physical Exam ?Gen: NAD, alert, cooperative with exam, well-appearing ?MSK:  ?Neurovascularly intact   ? ?ECSWT Note ?Laura Knox ?1965-12-11 ? ?Procedure: ECSWT ?Indications: right heel pain  ? ?Procedure Details ?Consent: Risks of procedure as well as the alternatives and risks of each were explained to the (patient/caregiver).  Consent for procedure obtained. ?Time Out: Verified patient identification, verified procedure, site/side was marked, verified correct patient position, special equipment/implants available, medications/allergies/relevent history reviewed, required imaging and test results available.  Performed.  The area was cleaned with iodine and alcohol swabs.   ? ?The right heel was targeted for Extracorporeal shockwave therapy.   ? ?Preset: achillodynia ?Power Level: 50 ?Frequency: 10 ?Impulse/cycles: 3100 ?Head size: medium  ?Session: 2nd ? ?Patient did tolerate procedure well. ? ? ? ?ASSESSMENT & PLAN:  ? ?Plantar fasciitis of right foot ?Completed shockwave therapy ? ? ? ? ?

## 2022-01-22 NOTE — Assessment & Plan Note (Signed)
Completed shockwave therapy  

## 2022-01-23 ENCOUNTER — Other Ambulatory Visit (INDEPENDENT_AMBULATORY_CARE_PROVIDER_SITE_OTHER): Payer: Self-pay | Admitting: Family Medicine

## 2022-01-23 DIAGNOSIS — R7303 Prediabetes: Secondary | ICD-10-CM

## 2022-01-24 ENCOUNTER — Encounter: Payer: Self-pay | Admitting: Family Medicine

## 2022-01-24 ENCOUNTER — Telehealth: Payer: Self-pay | Admitting: Physical Medicine and Rehabilitation

## 2022-01-24 ENCOUNTER — Ambulatory Visit (INDEPENDENT_AMBULATORY_CARE_PROVIDER_SITE_OTHER): Payer: Self-pay | Admitting: Family Medicine

## 2022-01-24 ENCOUNTER — Encounter (INDEPENDENT_AMBULATORY_CARE_PROVIDER_SITE_OTHER): Payer: Self-pay | Admitting: Family Medicine

## 2022-01-24 DIAGNOSIS — M722 Plantar fascial fibromatosis: Secondary | ICD-10-CM

## 2022-01-24 NOTE — Telephone Encounter (Signed)
Spoke with patient this afternoon via telephone and informed her that 2nd genicular nerve block was not approved by her insurance company. Patient encouraged to call [employer] to find out more information.  ?

## 2022-01-24 NOTE — Telephone Encounter (Signed)
LAST APPOINTMENT DATE: 12/17/21 ?NEXT APPOINTMENT DATE: 01/29/22 ? ? ?[id number] [place], [place] - [address] ?[address] ?[place] [address] ?Phone: [phone] Fax: [phone] ? ?[facility] - [place], [place] - [address] ?[address] ?[place] [address] ?Phone: [phone] Fax: [phone] ? ?Patient is requesting a refill of the following medications: ?No prescriptions requested or ordered in this encounter ? ? ?Date last filled: 12/17/21 ?Previously prescribed by dawn ? ?Lab Results ?     Component                Value               Date                 ?     HGBA1C                   5.9 (H)             06/18/2021           ?     HGBA1C                   5.9                 02/06/2021           ?     HGBA1C                   6.0 (H)             12/27/2020           ?Lab Results ?     Component                Value               Date                 ?     LDLCALC                  81                  12/27/2020           ?     CREATININE               0.70                06/18/2021           ?Lab Results ?     Component                Value               Date                 ?     VD25OH                   76.3                06/18/2021           ?     VD25OH                   53.2                02/06/2021           ?  VD25OH                   55.2                12/27/2020           ? ?BP Readings from Last 3 Encounters: ?01/16/22 : 125/80 ?01/08/22 : 120/80 ?12/26/21 : 112/65 ?

## 2022-01-24 NOTE — Progress Notes (Signed)
?  Laura Knox - 56 y.o. female MRN [id number]  Date of birth: Mar 08, 1966 ? ?SUBJECTIVE:  Including CC & ROS.  ?No chief complaint on file. ? ? ?Laura Knox is a 56 y.o. female that is here for shockwave therapy. ? ? ? ?Review of Systems ?See HPI  ? ?HISTORY: Past Medical, Surgical, Social, and Family History Reviewed & Updated per EMR.   ?Pertinent Historical Findings include: ? ?Past Medical History:  ?Diagnosis Date  ? B12 deficiency   ? Bilateral swelling of feet and ankles   ? Hypertension   ? Joint pain   ? Kidney stones   ? Knee pain   ? Osteoarthritis   ? Other fatigue   ? Prediabetes   ? Sleep apnea   ? Thyroid disease   ? Vitamin D deficiency   ? ? ?Past Surgical History:  ?Procedure Laterality Date  ? COLONOSCOPY WITH PROPOFOL N/A 04/10/2021  ? Procedure: COLONOSCOPY WITH PROPOFOL;  Surgeon: Lynann Bologna, MD;  Location: [facility];  Service: Endoscopy;  Laterality: N/A;  ? KIDNEY STONE SURGERY    ? miniscus repair Left   ? POLYPECTOMY  04/10/2021  ? Procedure: POLYPECTOMY;  Surgeon: Lynann Bologna, MD;  Location: [facility];  Service: Endoscopy;;  ? SEPTOPLASTY    ? ? ? ?PHYSICAL EXAM:  ?VS: Ht 5\' 7"  (1.702 m)   Wt (!) 330 lb (149.7 kg)   BMI 51.69 kg/m?  ?Physical Exam ?Gen: NAD, alert, cooperative with exam, well-appearing ?MSK:  ?Neurovascularly intact   ? ?ECSWT Note ?Laura Knox ?[birth year] ? ?Procedure: ECSWT ?Indications: right heel pain ? ?Procedure Details ?Consent: Risks of procedure as well as the alternatives and risks of each were explained to the (patient/caregiver).  Consent for procedure obtained. ?Time Out: Verified patient identification, verified procedure, site/side was marked, verified correct patient position, special equipment/implants available, medications/allergies/relevent history reviewed, required imaging and test results available.  Performed.  The area was cleaned with iodine and alcohol swabs.   ? ?The right heel was targeted for Extracorporeal shockwave therapy.  ? ?Preset:  achillodynia ?Power Level: 60 ?Frequency: 10 ?Impulse/cycles: 3000 ?Head size: medium  ?Session: 3rd ? ?Patient did tolerate procedure well. ? ? ? ?ASSESSMENT & PLAN:  ? ?Plantar fasciitis of right foot ?Completed shockwave therapy. ? ? ? ? ?

## 2022-01-24 NOTE — Assessment & Plan Note (Signed)
Completed shockwave therapy  

## 2022-01-27 NOTE — Progress Notes (Signed)
? ?ANNUAL EXAM ?Patient name: Laura Knox MRN [id number]  Date of birth: Apr 07, 1966 ?Chief Complaint:   ?Annual Exam ? ?History of Present Illness:   ?Anjanette Gilkey is a 56 y.o. G0P0000 female being seen today for a routine annual exam.  ? ?Current complaints: None currently. Met a new partner and started being sexually active after none for 13 years. Had some brown discharge after intercourse initially but none for the last month.  ? ?No LMP recorded. Patient is postmenopausal. ? ? ?The pregnancy intention screening data noted above was reviewed. Potential methods of contraception were discussed. The patient elected to proceed with No data recorded.  ? ?Last pap No recent pap on file. H/O abnormal pap: no ?Mammogram was 02/2021 and wnl at [facility] ?Health Maintenance Due  ?Topic Date Due  ? TETANUS/TDAP  Never done  ? PAP SMEAR-Modifier  Never done  ? MAMMOGRAM  Never done  ? Zoster Vaccines- Shingrix (1 of 2) Never done  ? COVID-19 Vaccine (5 - Booster for Pfizer series) 04/12/2021  ? ? ? ? ?  12/27/2020  ?  7:34 AM  ?Depression screen PHQ 2/9  ?Decreased Interest 3  ?Down, Depressed, Hopeless 2  ?PHQ - 2 Score 5  ?Altered sleeping 3  ?Tired, decreased energy 3  ?Change in appetite 3  ?Feeling bad or failure about yourself  3  ?Trouble concentrating 0  ?Moving slowly or fidgety/restless 3  ?Suicidal thoughts 0  ?PHQ-9 Score 20  ?Difficult doing work/chores Not difficult at all  ? ?  ?   ? View : No data to display.  ?  ?  ?  ? ? ? ?Review of Systems:   ?Pertinent items are noted in HPI ?Denies any headaches, blurred vision, fatigue, shortness of breath, chest pain, abdominal pain, abnormal vaginal discharge/itching/odor/irritation, problems with periods, bowel movements, urination, or intercourse unless otherwise stated above.  ?Pertinent History Reviewed:  ?Reviewed past medical,surgical, social and family history.  ?Reviewed problem list, medications and allergies. ?Physical Assessment:  ? ?Vitals:  ? 01/31/22 1538  ?BP:  137/83  ?Pulse: 66  ?Weight: (!) 330 lb (149.7 kg)  ?Height: $RemoveB'5\' 7"'uPInevkz$  (1.702 m)  ?Body mass index is 51.69 kg/m?. ?  ?Physical Examination:  ?General appearance - well appearing, and in no distress ?Mental status - alert, oriented to person, place, and time ?Psych:  She has a normal mood and affect ?Skin - warm and dry, normal color, no suspicious lesions noted ?Chest - effort normal, all lung fields clear to auscultation bilaterally ?Heart - normal rate and regular rhythm ?Neck:  midline trachea, no thyromegaly or nodules ?Breasts - breasts appear normal, no suspicious masses, no skin or nipple changes or  axillary nodes ?Abdomen - soft, nontender, nondistended, no masses or organomegaly ?Pelvic -  ?VULVA: normal appearing vulva with no masses, tenderness or lesions   ?VAGINA: normal appearing vagina with normal color and discharge, no lesions   ?CERVIX: normal appearing cervix without discharge or lesions, no CMT ?UTERUS: uterus is felt to be normal size, shape, consistency and nontender  ?ADNEXA: No adnexal masses or tenderness noted. ?Extremities:  No swelling or varicosities noted ? ?Chaperone present for exam ? ?No results found for this or any previous visit (from the past 24 hour(s)).  ?Assessment & Plan:  ?Sherrin was seen today for annual exam. ? ?Diagnoses and all orders for this visit: ? ?Encounter for annual routine gynecological examination ?-     Cancel: Cytology - PAP ?-     Cancel:  MM 3D SCREEN BREAST BILATERAL; Future ?-     Cytology - PAP ?-     MM 3D SCREEN BREAST BILATERAL; Future ? ?- Cervical cancer screening: Discussed guidelines. Pap with HPV done  ?- Discussed GC/CT - she accepts ?- Breast Health: Encouraged self breast awareness/SBE. Discussed limits of clinical breast exam for detecting breast cancer. Discussed importance of annual MXR.  Rx given for May ?- Climacteric/Sexual health: Reviewed typical and atypical symptoms of menopause/peri-menopause. Discussed PMB and to call if any amount of  spotting. She had some PCB that was after resuming sexual active after 13 years. She has not had any for a month. If it occurs again she will call and we will check an [place].  ?- Colonoscopy:  03/2021 ?- F/U 12 months and prn ? ? ? ? ?Orders Placed This Encounter  ?Procedures  ? MM 3D SCREEN BREAST BILATERAL  ? ? ?Meds: No orders of the defined types were placed in this encounter. ? ? ?Follow-up: Return in about 1 year (around 02/01/2023) for annual. ? ?Radene Gunning, MD ?01/31/2022 ?4:13 PM ?

## 2022-01-28 ENCOUNTER — Ambulatory Visit (INDEPENDENT_AMBULATORY_CARE_PROVIDER_SITE_OTHER): Payer: Self-pay | Admitting: Family Medicine

## 2022-01-28 ENCOUNTER — Encounter: Payer: Self-pay | Admitting: Family Medicine

## 2022-01-28 DIAGNOSIS — M722 Plantar fascial fibromatosis: Secondary | ICD-10-CM

## 2022-01-28 NOTE — Progress Notes (Signed)
?  Laura Knox - 56 y.o. female MRN [id number]  Date of birth: [birth year] ? ?SUBJECTIVE:  Including CC & ROS.  ?No chief complaint on file. ? ? ?Laura Knox is a 56 y.o. female that is  here for shockwave therapy. ? ? ?Review of Systems ?See HPI  ? ?HISTORY: Past Medical, Surgical, Social, and Family History Reviewed & Updated per EMR.   ?Pertinent Historical Findings include: ? ?Past Medical History:  ?Diagnosis Date  ? B12 deficiency   ? Bilateral swelling of feet and ankles   ? Hypertension   ? Joint pain   ? Kidney stones   ? Knee pain   ? Osteoarthritis   ? Other fatigue   ? Prediabetes   ? Sleep apnea   ? Thyroid disease   ? Vitamin D deficiency   ? ? ?Past Surgical History:  ?Procedure Laterality Date  ? COLONOSCOPY WITH PROPOFOL N/A 04/10/2021  ? Procedure: COLONOSCOPY WITH PROPOFOL;  Surgeon: Lynann Bologna, MD;  Location: [facility];  Service: Endoscopy;  Laterality: N/A;  ? KIDNEY STONE SURGERY    ? miniscus repair Left   ? POLYPECTOMY  04/10/2021  ? Procedure: POLYPECTOMY;  Surgeon: Lynann Bologna, MD;  Location: [facility];  Service: Endoscopy;;  ? SEPTOPLASTY    ? ? ? ?PHYSICAL EXAM:  ?VS: Ht 5\' 7"  (1.702 m)   Wt (!) 330 lb (149.7 kg)   BMI 51.69 kg/m?  ?Physical Exam ?Gen: NAD, alert, cooperative with exam, well-appearing ?MSK:  ?Neurovascularly intact   ? ?ECSWT Note ?Laura Knox ?07/15/1966 ? ?Procedure: ECSWT ?Indications: right heel pain  ? ?Procedure Details ?Consent: Risks of procedure as well as the alternatives and risks of each were explained to the (patient/caregiver).  Consent for procedure obtained. ?Time Out: Verified patient identification, verified procedure, site/side was marked, verified correct patient position, special equipment/implants available, medications/allergies/relevent history reviewed, required imaging and test results available.  Performed.  The area was cleaned with iodine and alcohol swabs.   ? ?The right heel  was targeted for Extracorporeal shockwave therapy.  ? ?Preset:  achillodynia ?Power Level: 70 ?Frequency: 10 ?Impulse/cycles: 3000 ?Head size: medium  ?Session: 4th ? ?Patient did tolerate procedure well. ? ? ? ?ASSESSMENT & PLAN:  ? ?Plantar fasciitis of right foot ?Completed shockwave therapy ? ? ? ? ?

## 2022-01-28 NOTE — Assessment & Plan Note (Signed)
Completed shockwave therapy  

## 2022-01-30 ENCOUNTER — Encounter: Payer: Self-pay | Admitting: Family Medicine

## 2022-01-30 ENCOUNTER — Ambulatory Visit: Payer: Self-pay | Admitting: Family Medicine

## 2022-01-30 DIAGNOSIS — M722 Plantar fascial fibromatosis: Secondary | ICD-10-CM

## 2022-01-30 NOTE — Progress Notes (Signed)
?  Laura Knox - 56 y.o. female MRN [id number]  Date of birth: 1966-02-03 ? ?SUBJECTIVE:  Including CC & ROS.  ?No chief complaint on file. ? ? ?Laura Knox is a 56 y.o. female that is here for shockwave therapy. ? ? ? ?Review of Systems ?See HPI  ? ?HISTORY: Past Medical, Surgical, Social, and Family History Reviewed & Updated per EMR.   ?Pertinent Historical Findings include: ? ?Past Medical History:  ?Diagnosis Date  ? B12 deficiency   ? Bilateral swelling of feet and ankles   ? Hypertension   ? Joint pain   ? Kidney stones   ? Knee pain   ? Osteoarthritis   ? Other fatigue   ? Prediabetes   ? Sleep apnea   ? Thyroid disease   ? Vitamin D deficiency   ? ? ?Past Surgical History:  ?Procedure Laterality Date  ? COLONOSCOPY WITH PROPOFOL N/A 04/10/2021  ? Procedure: COLONOSCOPY WITH PROPOFOL;  Surgeon: Lynann Bologna, MD;  Location: [facility];  Service: Endoscopy;  Laterality: N/A;  ? KIDNEY STONE SURGERY    ? miniscus repair Left   ? POLYPECTOMY  04/10/2021  ? Procedure: POLYPECTOMY;  Surgeon: Lynann Bologna, MD;  Location: [facility];  Service: Endoscopy;;  ? SEPTOPLASTY    ? ? ? ?PHYSICAL EXAM:  ?VS: Ht 5\' 7"  (1.702 m)   Wt (!) 330 lb (149.7 kg)   BMI 51.69 kg/m?  ?Physical Exam ?Gen: NAD, alert, cooperative with exam, well-appearing ?MSK:  ?Neurovascularly intact   ? ?ECSWT Note ?Laura Knox ?August 25, 1966 ? ?Procedure: ECSWT ?Indications: right heel pain ? ?Procedure Details ?Consent: Risks of procedure as well as the alternatives and risks of each were explained to the (patient/caregiver).  Consent for procedure obtained. ?Time Out: Verified patient identification, verified procedure, site/side was marked, verified correct patient position, special equipment/implants available, medications/allergies/relevent history reviewed, required imaging and test results available.  Performed.  The area was cleaned with iodine and alcohol swabs.   ? ?The right heel was targeted for Extracorporeal shockwave therapy.  ? ?Preset:  achillodynia ?Power Level: 70 ?Frequency: 10 ?Impulse/cycles: 3000 ?Head size: medium  ?Session: 5th ? ?Patient did tolerate procedure well. ? ? ? ?ASSESSMENT & PLAN:  ? ?Plantar fasciitis of right foot ?Completed shockwave therapy ? ? ? ? ?

## 2022-01-30 NOTE — Assessment & Plan Note (Signed)
Completed shockwave therapy  

## 2022-01-31 ENCOUNTER — Encounter: Payer: Self-pay | Admitting: Obstetrics and Gynecology

## 2022-01-31 ENCOUNTER — Other Ambulatory Visit (HOSPITAL_COMMUNITY)
Admission: RE | Admit: 2022-01-31 | Discharge: 2022-01-31 | Disposition: A | Discharge status: Home / Self Care | Payer: BC Managed Care – PPO | Source: Ambulatory Visit | Attending: Obstetrics and Gynecology | Admitting: Obstetrics and Gynecology

## 2022-01-31 ENCOUNTER — Ambulatory Visit (INDEPENDENT_AMBULATORY_CARE_PROVIDER_SITE_OTHER): Payer: BC Managed Care – PPO | Admitting: Obstetrics and Gynecology

## 2022-01-31 VITALS — BP 137/83 | HR 66 | Ht 67.0 in | Wt 330.0 lb

## 2022-01-31 DIAGNOSIS — Z01419 Encounter for gynecological examination (general) (routine) without abnormal findings: Secondary | ICD-10-CM

## 2022-02-05 ENCOUNTER — Encounter: Payer: Self-pay | Admitting: Family Medicine

## 2022-02-05 ENCOUNTER — Ambulatory Visit: Payer: Self-pay | Admitting: Family Medicine

## 2022-02-05 DIAGNOSIS — M722 Plantar fascial fibromatosis: Secondary | ICD-10-CM

## 2022-02-05 LAB — CYTOLOGY - PAP
Adequacy: ABSENT
Chlamydia: NEGATIVE
Comment: NEGATIVE
Comment: NEGATIVE
Comment: NORMAL
Diagnosis: NEGATIVE
High risk HPV: NEGATIVE
Neisseria Gonorrhea: NEGATIVE

## 2022-02-05 NOTE — Progress Notes (Signed)
?  Laura Knox - 56 y.o. female MRN [id number]  Date of birth: 20-Oct-1966 ? ?SUBJECTIVE:  Including CC & ROS.  ?No chief complaint on file. ? ? ?Laura Knox is a 56 y.o. female that is  here for shockwave therapy. ? ? ? ?Review of Systems ?See HPI  ? ?HISTORY: Past Medical, Surgical, Social, and Family History Reviewed & Updated per EMR.   ?Pertinent Historical Findings include: ? ?Past Medical History:  ?Diagnosis Date  ? B12 deficiency   ? Bilateral swelling of feet and ankles   ? Hypertension   ? Joint pain   ? Kidney stones   ? Knee pain   ? Osteoarthritis   ? Other fatigue   ? Prediabetes   ? Sleep apnea   ? Thyroid disease   ? Vitamin D deficiency   ? ? ?Past Surgical History:  ?Procedure Laterality Date  ? COLONOSCOPY WITH PROPOFOL N/A 04/10/2021  ? Procedure: COLONOSCOPY WITH PROPOFOL;  Surgeon: Jackquline Denmark, MD;  Location: [facility];  Service: Endoscopy;  Laterality: N/A;  ? KIDNEY STONE SURGERY    ? miniscus repair Left   ? POLYPECTOMY  04/10/2021  ? Procedure: POLYPECTOMY;  Surgeon: Jackquline Denmark, MD;  Location: [facility];  Service: Endoscopy;;  ? SEPTOPLASTY    ? ? ? ?PHYSICAL EXAM:  ?VS: Ht 5\' 7"  (1.702 m)   Wt (!) 330 lb (149.7 kg)   BMI 51.69 kg/m?  ?Physical Exam ?Gen: NAD, alert, cooperative with exam, well-appearing ?MSK:  ?Neurovascularly intact   ? ?ECSWT Note ?Laura Knox ?1966-08-05 ? ?Procedure: ECSWT ?Indications: right heel pain ? ?Procedure Details ?Consent: Risks of procedure as well as the alternatives and risks of each were explained to the (patient/caregiver).  Consent for procedure obtained. ?Time Out: Verified patient identification, verified procedure, site/side was marked, verified correct patient position, special equipment/implants available, medications/allergies/relevent history reviewed, required imaging and test results available.  Performed.  The area was cleaned with iodine and alcohol swabs.   ? ?The right heel was targeted for Extracorporeal shockwave therapy.  ? ?Preset:  plantar fasciitis ?Power Level: 90 ?Frequency: 10 ?Impulse/cycles: 3200 ?Head size: medium  ?Session: 6th ? ?Patient did tolerate procedure well. ? ? ? ?ASSESSMENT & PLAN:  ? ?Plantar fasciitis of right foot ?Completed shockwave therapy ? ? ? ? ?

## 2022-02-05 NOTE — Assessment & Plan Note (Signed)
Completed shockwave therapy  

## 2022-02-06 ENCOUNTER — Encounter (INDEPENDENT_AMBULATORY_CARE_PROVIDER_SITE_OTHER): Payer: Self-pay | Admitting: Nurse Practitioner

## 2022-02-06 ENCOUNTER — Ambulatory Visit (INDEPENDENT_AMBULATORY_CARE_PROVIDER_SITE_OTHER): Payer: BC Managed Care – PPO | Admitting: Family Medicine

## 2022-02-06 ENCOUNTER — Ambulatory Visit (INDEPENDENT_AMBULATORY_CARE_PROVIDER_SITE_OTHER): Payer: BC Managed Care – PPO | Admitting: Nurse Practitioner

## 2022-02-06 VITALS — BP 132/75 | HR 68 | Temp 97.7°F | Ht 67.0 in | Wt 328.0 lb

## 2022-02-06 DIAGNOSIS — Z6841 Body Mass Index (BMI) 40.0 and over, adult: Secondary | ICD-10-CM | POA: Diagnosis not present

## 2022-02-06 DIAGNOSIS — Z79899 Other long term (current) drug therapy: Secondary | ICD-10-CM

## 2022-02-06 DIAGNOSIS — Z9189 Other specified personal risk factors, not elsewhere classified: Secondary | ICD-10-CM

## 2022-02-06 DIAGNOSIS — E559 Vitamin D deficiency, unspecified: Secondary | ICD-10-CM | POA: Diagnosis not present

## 2022-02-06 DIAGNOSIS — Z7985 Long-term (current) use of injectable non-insulin antidiabetic drugs: Secondary | ICD-10-CM

## 2022-02-06 DIAGNOSIS — R7303 Prediabetes: Secondary | ICD-10-CM | POA: Diagnosis not present

## 2022-02-06 DIAGNOSIS — E669 Obesity, unspecified: Secondary | ICD-10-CM | POA: Diagnosis not present

## 2022-02-07 ENCOUNTER — Telehealth: Payer: Self-pay | Admitting: *Deleted

## 2022-02-07 LAB — COMPREHENSIVE METABOLIC PANEL
ALT: 11 IU/L (ref 0–32)
AST: 14 IU/L (ref 0–40)
Albumin/Globulin Ratio: 1.5 (ref 1.2–2.2)
Albumin: 4.1 g/dL (ref 3.8–4.9)
Alkaline Phosphatase: 102 IU/L (ref 44–121)
BUN/Creatinine Ratio: 28 — ABNORMAL HIGH (ref 9–23)
BUN: 20 mg/dL (ref 6–24)
Bilirubin Total: 0.3 mg/dL (ref 0.0–1.2)
CO2: 26 mmol/L (ref 20–29)
Calcium: 9.1 mg/dL (ref 8.7–10.2)
Chloride: 101 mmol/L (ref 96–106)
Creatinine, Ser: 0.71 mg/dL (ref 0.57–1.00)
Globulin, Total: 2.8 g/dL (ref 1.5–4.5)
Glucose: 84 mg/dL (ref 70–99)
Potassium: 4.1 mmol/L (ref 3.5–5.2)
Sodium: 141 mmol/L (ref 134–144)
Total Protein: 6.9 g/dL (ref 6.0–8.5)
eGFR: 100 mL/min/{1.73_m2} (ref >59)

## 2022-02-07 LAB — HEMOGLOBIN A1C
Est. average glucose Bld gHb Est-mCnc: 114 mg/dL
Hgb A1c MFr Bld: 5.6 % (ref 4.8–5.6)

## 2022-02-07 LAB — VITAMIN D 25 HYDROXY (VIT D DEFICIENCY, FRACTURES): Vit D, 25-Hydroxy: 46.7 ng/mL (ref 30.0–100.0)

## 2022-02-07 NOTE — Telephone Encounter (Signed)
Pt called stating Dr. [name] recommended her getting Zilretta & was checking to see if this has been approved by her insurance.  ?

## 2022-02-12 ENCOUNTER — Ambulatory Visit: Payer: Self-pay | Admitting: Family Medicine

## 2022-02-12 ENCOUNTER — Encounter: Payer: Self-pay | Admitting: Family Medicine

## 2022-02-12 DIAGNOSIS — M722 Plantar fascial fibromatosis: Secondary | ICD-10-CM

## 2022-02-12 NOTE — Assessment & Plan Note (Signed)
Completed shockwave therapy  

## 2022-02-12 NOTE — Progress Notes (Signed)
?  Laura Knox - 56 y.o. female MRN [id number]  Date of birth: 1966-04-18 ? ?SUBJECTIVE:  Including CC & ROS.  ?No chief complaint on file. ? ? ?Laura Knox is a 56 y.o. female that is here for shockwave therapy. ? ? ? ?Review of Systems ?See HPI  ? ?HISTORY: Past Medical, Surgical, Social, and Family History Reviewed & Updated per EMR.   ?Pertinent Historical Findings include: ? ?Past Medical History:  ?Diagnosis Date  ? B12 deficiency   ? Bilateral swelling of feet and ankles   ? Hypertension   ? Joint pain   ? Kidney stones   ? Knee pain   ? Osteoarthritis   ? Other fatigue   ? Prediabetes   ? Sleep apnea   ? Thyroid disease   ? Vitamin D deficiency   ? ? ?Past Surgical History:  ?Procedure Laterality Date  ? COLONOSCOPY WITH PROPOFOL N/A 04/10/2021  ? Procedure: COLONOSCOPY WITH PROPOFOL;  Surgeon: Lynann Bologna, MD;  Location: [facility];  Service: Endoscopy;  Laterality: N/A;  ? KIDNEY STONE SURGERY    ? miniscus repair Left   ? POLYPECTOMY  04/10/2021  ? Procedure: POLYPECTOMY;  Surgeon: Lynann Bologna, MD;  Location: [facility];  Service: Endoscopy;;  ? SEPTOPLASTY    ? ? ? ?PHYSICAL EXAM:  ?VS: Ht 5\' 7"  (1.702 m)   Wt (!) 328 lb (148.8 kg)   BMI 51.37 kg/m?  ?Physical Exam ?Gen: NAD, alert, cooperative with exam, well-appearing ?MSK:  ?Neurovascularly intact   ? ?ECSWT Note ?Laura Knox ?[birth year] ? ?Procedure: ECSWT ?Indications: right heel pain ? ?Procedure Details ?Consent: Risks of procedure as well as the alternatives and risks of each were explained to the (patient/caregiver).  Consent for procedure obtained. ?Time Out: Verified patient identification, verified procedure, site/side was marked, verified correct patient position, special equipment/implants available, medications/allergies/relevent history reviewed, required imaging and test results available.  Performed.  The area was cleaned with iodine and alcohol swabs.   ? ?The right heel was targeted for Extracorporeal shockwave therapy.  ? ?Preset:  plantar fasciitis  ?Power Level: 70-90 ?Frequency: 10 ?Impulse/cycles: 3000 ?Head size: medium  ?Session: 7th ? ?Patient did tolerate procedure well. ? ? ? ?ASSESSMENT & PLAN:  ? ?Plantar fasciitis of right foot ?Completed shockwave therapy.  ? ? ? ? ?

## 2022-02-13 NOTE — Telephone Encounter (Signed)
Spoke to pt. Per briana, no auth required. Pt is scheduled 4/24 for bilateral zilretta.  ?

## 2022-02-14 ENCOUNTER — Ambulatory Visit: Payer: Self-pay | Admitting: Family Medicine

## 2022-02-14 ENCOUNTER — Encounter: Payer: Self-pay | Admitting: Family Medicine

## 2022-02-14 DIAGNOSIS — M722 Plantar fascial fibromatosis: Secondary | ICD-10-CM

## 2022-02-14 NOTE — Progress Notes (Signed)
?  Laura Knox - 55 y.o. female MRN [id number]  Date of birth: Feb 10, 1966 ? ?SUBJECTIVE:  Including CC & ROS.  ?No chief complaint on file. ? ? ?Laura Knox is a 56 y.o. female that is here for shockwave therapy. ? ? ? ?Review of Systems ?See HPI  ? ?HISTORY: Past Medical, Surgical, Social, and Family History Reviewed & Updated per EMR.   ?Pertinent Historical Findings include: ? ?Past Medical History:  ?Diagnosis Date  ? B12 deficiency   ? Bilateral swelling of feet and ankles   ? Hypertension   ? Joint pain   ? Kidney stones   ? Knee pain   ? Osteoarthritis   ? Other fatigue   ? Prediabetes   ? Sleep apnea   ? Thyroid disease   ? Vitamin D deficiency   ? ? ?Past Surgical History:  ?Procedure Laterality Date  ? COLONOSCOPY WITH PROPOFOL N/A 04/10/2021  ? Procedure: COLONOSCOPY WITH PROPOFOL;  Surgeon: Jackquline Denmark, MD;  Location: [facility];  Service: Endoscopy;  Laterality: N/A;  ? KIDNEY STONE SURGERY    ? miniscus repair Left   ? POLYPECTOMY  04/10/2021  ? Procedure: POLYPECTOMY;  Surgeon: Jackquline Denmark, MD;  Location: [facility];  Service: Endoscopy;;  ? SEPTOPLASTY    ? ? ? ?PHYSICAL EXAM:  ?VS: Ht 5\' 7"  (1.702 m)   Wt (!) 328 lb (148.8 kg)   BMI 51.37 kg/m?  ?Physical Exam ?Gen: NAD, alert, cooperative with exam, well-appearing ?MSK:  ?Neurovascularly intact   ? ?ECSWT Note ?Laura Knox ?[birth year] ? ?Procedure: ECSWT ?Indications: right heel pain  ? ?Procedure Details ?Consent: Risks of procedure as well as the alternatives and risks of each were explained to the (patient/caregiver).  Consent for procedure obtained. ?Time Out: Verified patient identification, verified procedure, site/side was marked, verified correct patient position, special equipment/implants available, medications/allergies/relevent history reviewed, required imaging and test results available.  Performed.  The area was cleaned with iodine and alcohol swabs.   ? ?The right heel was targeted for Extracorporeal shockwave therapy.  ? ?Preset:  plantar fasciitis  ?Power Level: 70 ?Frequency: 10 ?Impulse/cycles: 2800 ?Head size: medium  ?Session: 8th ? ?Patient did tolerate procedure well. ? ? ? ?ASSESSMENT & PLAN:  ? ?Plantar fasciitis of right foot ?Completed shockwave therapy ? ? ? ? ?

## 2022-02-14 NOTE — Assessment & Plan Note (Signed)
Completed shockwave therapy  

## 2022-02-18 ENCOUNTER — Ambulatory Visit: Payer: BC Managed Care – PPO | Admitting: Family Medicine

## 2022-02-19 IMAGING — DX DG HAND COMPLETE 3+V*R*
3 series · 3 of 3 positions shown · non-contrast
Comparison: None.

CLINICAL DATA: Right hand pain and stiffness for months

EXAM:
RIGHT HAND - COMPLETE 3+ VIEW

[hand pa]
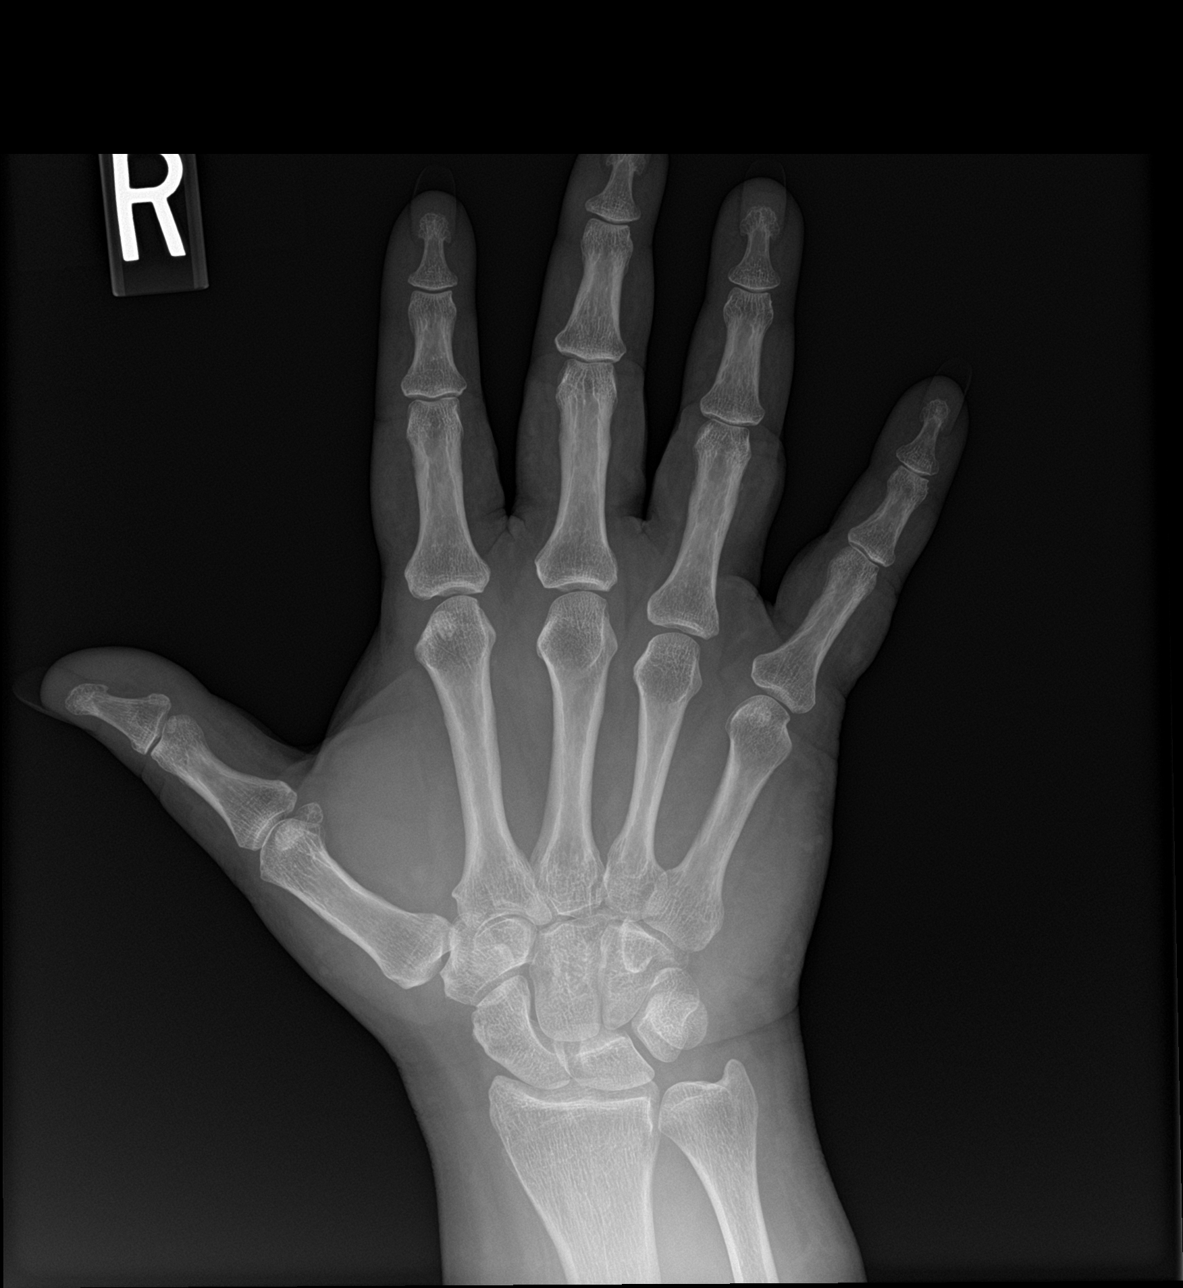

[hand obl]
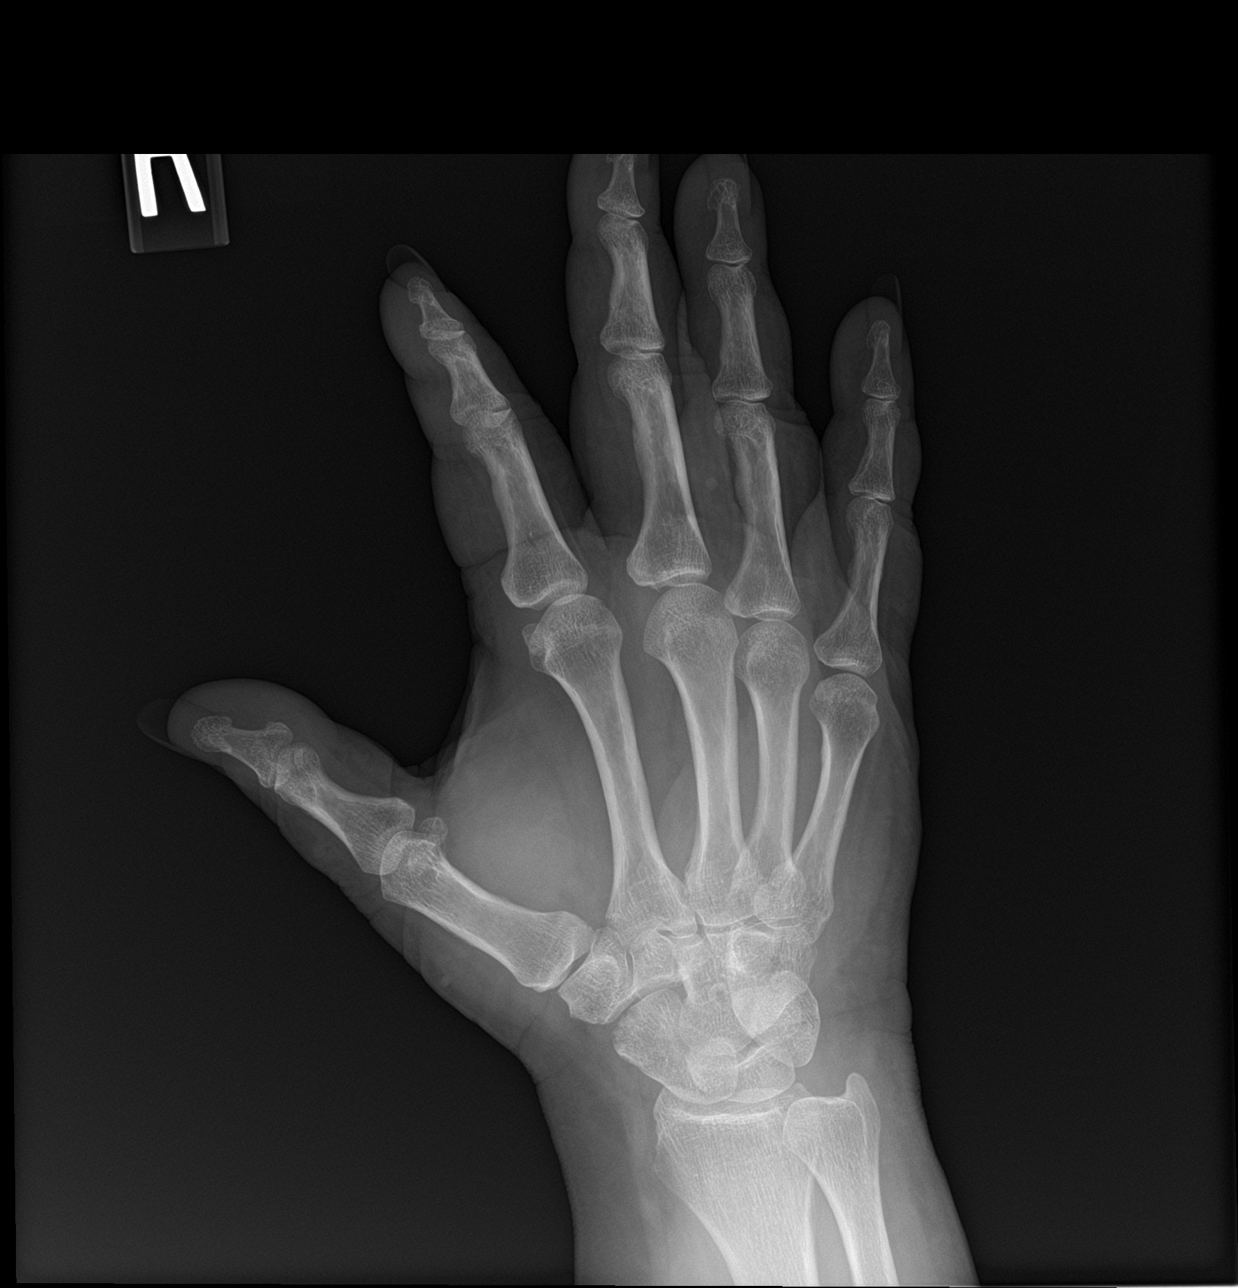

[hand lat]
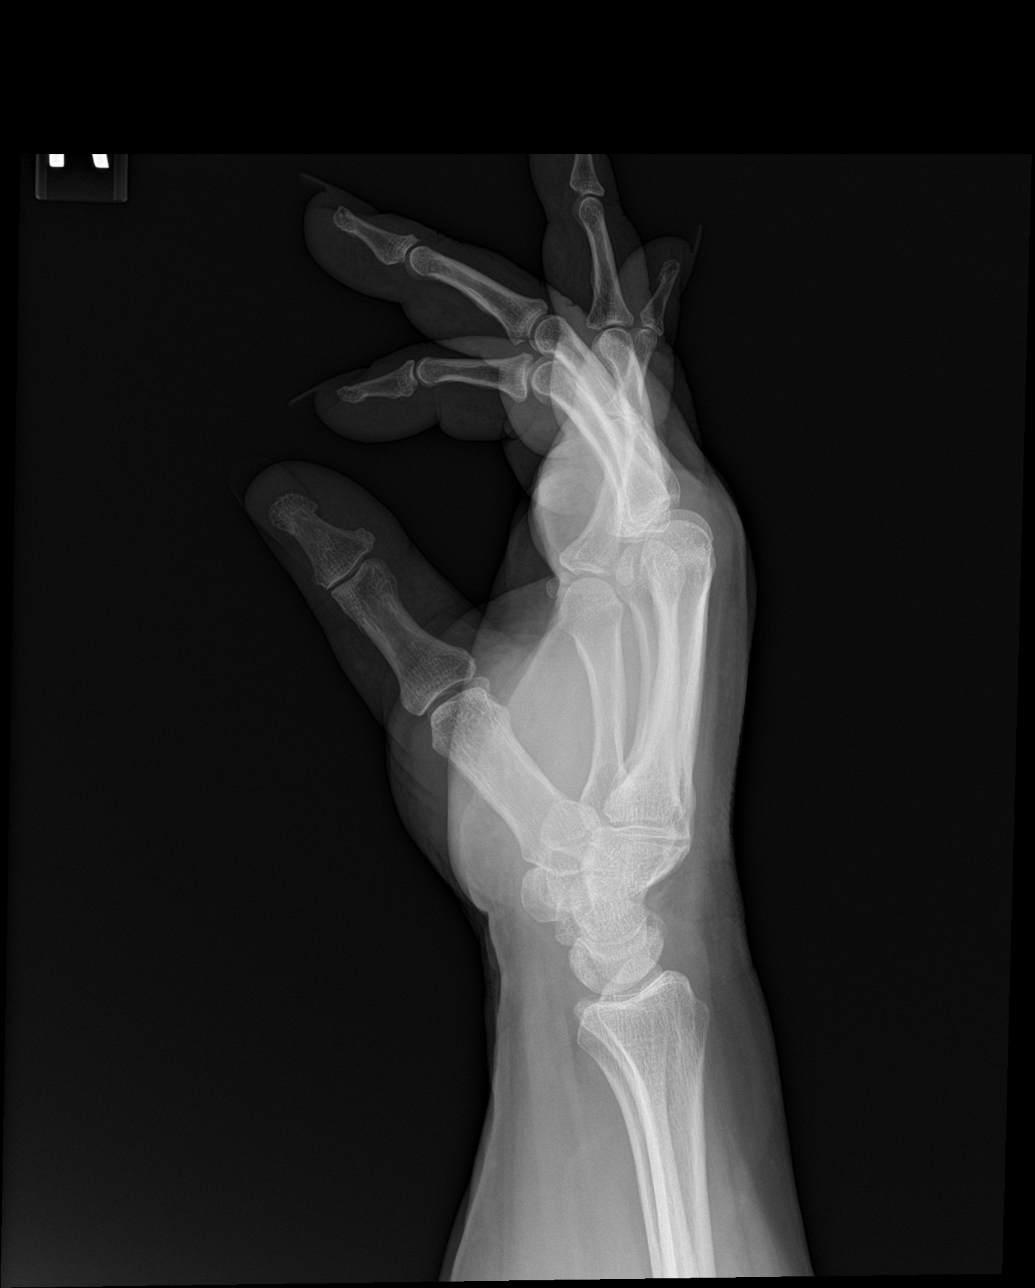

[3 of 3 positions shown; findings below may reference images not displayed]

FINDINGS: Frontal, oblique, lateral views of the right hand are obtained. No
fracture, subluxation, or dislocation. Mild diffuse interphalangeal
joint space narrowing compatible with osteoarthritis. No erosive
changes. Soft tissues are unremarkable.
IMPRESSION: 1. Mild osteoarthritis throughout the interphalangeal joints.

## 2022-02-19 NOTE — Progress Notes (Signed)
? ? ? ?Chief Complaint:  ? ?OBESITY ?Laura Knox is here to discuss her progress with her obesity treatment plan along with follow-up of her obesity related diagnoses. Laura Knox is on following a lower carbohydrate, vegetable and lean protein rich diet plan and states she is following her eating plan approximately 50% of the time. Laura Knox states she is doing water aerobics for 60 minutes 3 times per week. ? ?Today's visit was #: 51 ?Starting weight: 378 lbs ?Starting date: 12/27/2020 ?Today's weight: 328 lbs ?Today's date: 02/06/2022 ?Total lbs lost to date: 50 lbs ?Total lbs lost since last in-office visit: 2 lbs ? ?Interim History: Laura Knox is struggling with the low carb plan. She states it was hard to stay away from bread and potatoes. She is eating out 4-5 times per week. She found Category 4 plan boring and doesn't like journaling. She is struggling with left knee pain and right foot pain.  ? ?Subjective:  ? ?1. Pre-diabetes ?Laura Knox's last A1C was 5.9. She is taking Ozempic 1 mg (increased on 02/04/2022). She reports side effects of dizziness, tachycardia, nausea, and abdominal pain. She denies vomiting. She was prescribed Wegovy but didn't start due to cost. She did not have side effects on lower dose of Ozempic.  ? ?2. Vitamin D deficiency ?Laura Knox is not taking Vitamin D currently.  ? ?3.  Medication management ?We discussed medication management today.  ? ?4. At risk for side effect of medication ?Laura Knox is at risk for side effect of medication due to Ozempic. ? ?Assessment/Plan:  ? ?1. Pre-diabetes ?Laura Knox agrees to decrease Ozempic 0.5 mg. We discussed side effects. We will obtained labs today. She will continue to work on weight loss, exercise, and decreasing simple carbohydrates to help decrease the risk of diabetes.  ? ?- Hemoglobin A1c ? ?2. Vitamin D deficiency ?Low Vitamin D level contributes to fatigue and are associated with obesity, breast, and colon cancer. Laura Knox agrees to continue to take prescription Vitamin D  50,000 IU every week and she will follow-up for routine testing of Vitamin D, at least 2-3 times per year to avoid over-replacement. We obtained labs today.  ? ?- VITAMIN D 25 Hydroxy (Vit-D Deficiency, Fractures) ? ?3. Medication management ?We obtained labs today.  ? ?- Comprehensive metabolic panel ? ?4. At risk for side effect of medication ?Laura Knox was given approximately 15 minutes of drug side effect counseling today.  We discussed side effect possibility and risk versus benefits. Laura Knox agreed to the medication and will contact this office if these side effects are intolerable. ? ?Repetitive spaced learning was employed today to elicit superior memory formation and behavioral change.  ? ?5. Obesity, with current BMI of 51.4 ?Laura Knox is currently in the action stage of change. As such, her goal is to continue with weight loss efforts. She has agreed to the Category 4 Plan plus following a lower carbohydrate, vegetable and lean protein rich diet plan.  ? ?Exercise goals:  As is. ? ?Behavioral modification strategies: increasing lean protein intake, increasing water intake, no skipping meals, and meal planning and cooking strategies. ? ?Laura Knox has agreed to follow-up with our clinic in 3 weeks. She was informed of the importance of frequent follow-up visits to maximize her success with intensive lifestyle modifications for her multiple health conditions.  ? ?Objective:  ? ?Blood pressure 132/75, pulse 68, temperature 97.7 ?F (36.5 ?C), height 5\' 7"  (1.702 m), weight (!) 328 lb (148.8 kg), SpO2 91 %. ?Body mass index is 51.37 kg/m?. ? ?General: Cooperative, alert,  well developed, in no acute distress. ?HEENT: Conjunctivae and lids unremarkable. ?Cardiovascular: Regular rhythm.  ?Lungs: Normal work of breathing. ?Neurologic: No focal deficits.  ? ?Lab Results  ?Component Value Date  ? CREATININE 0.71 02/06/2022  ? BUN 20 02/06/2022  ? NA 141 02/06/2022  ? K 4.1 02/06/2022  ? CL 101 02/06/2022  ? CO2 26 02/06/2022   ? ?Lab Results  ?Component Value Date  ? ALT 11 02/06/2022  ? AST 14 02/06/2022  ? ALKPHOS 102 02/06/2022  ? BILITOT 0.3 02/06/2022  ? ?Lab Results  ?Component Value Date  ? HGBA1C 5.6 02/06/2022  ? HGBA1C 5.9 (H) 06/18/2021  ? HGBA1C 5.9 02/06/2021  ? HGBA1C 6.0 (H) 12/27/2020  ? ?Lab Results  ?Component Value Date  ? INSULIN 16.7 06/18/2021  ? INSULIN 20.4 12/27/2020  ? ?Lab Results  ?Component Value Date  ? TSH 3.41 02/06/2021  ? ?Lab Results  ?Component Value Date  ? CHOL 141 12/27/2020  ? HDL 44 12/27/2020  ? [place] 81 12/27/2020  ? TRIG 80 12/27/2020  ? ?Lab Results  ?Component Value Date  ? VD25OH 46.7 02/06/2022  ? VD25OH 76.3 06/18/2021  ? VD25OH 53.2 02/06/2021  ? ?Lab Results  ?Component Value Date  ? WBC 8.4 02/06/2021  ? HGB 13.8 02/06/2021  ? HCT 42 02/06/2021  ? MCV 87 12/27/2020  ? PLT 336 02/06/2021  ? ?Lab Results  ?Component Value Date  ? FERRITIN 88 02/06/2021  ? ?Attestation Statements:  ? ?Reviewed by clinician on day of visit: allergies, medications, problem list, medical history, surgical history, family history, social history, and previous encounter notes. ? ?I, Lizbeth Bark, RMA, am acting as Location manager for Everardo Pacific, FNP. ? ?I have reviewed the above documentation for accuracy and completeness, and I agree with the above. Everardo Pacific, FNP  ?

## 2022-02-21 ENCOUNTER — Ambulatory Visit: Payer: BC Managed Care – PPO | Admitting: Family Medicine

## 2022-02-21 ENCOUNTER — Ambulatory Visit: Payer: Self-pay

## 2022-02-21 DIAGNOSIS — G8929 Other chronic pain: Secondary | ICD-10-CM | POA: Diagnosis not present

## 2022-02-21 DIAGNOSIS — M25562 Pain in left knee: Secondary | ICD-10-CM | POA: Diagnosis not present

## 2022-02-21 DIAGNOSIS — M170 Bilateral primary osteoarthritis of knee: Secondary | ICD-10-CM

## 2022-02-21 DIAGNOSIS — M25561 Pain in right knee: Secondary | ICD-10-CM | POA: Diagnosis not present

## 2022-02-21 NOTE — Progress Notes (Signed)
Laura Knox presents to clinic today for Zilretta injection bilateral knees ? ?Procedure: Real-time Ultrasound Guided Injection of right knee superior lateral patellar space ?Device: Philips Affiniti 50G ?Images permanently stored and available for review in PACS ?Verbal informed consent obtained.  Discussed risks and benefits of procedure. Warned about infection, bleeding, hyperglycemia damage to structures among others. ?Patient expresses understanding and agreement ?Time-out conducted.   ?Noted no overlying erythema, induration, or other signs of local infection.   ?Skin prepped in a sterile fashion.   ?Local anesthesia: Topical Ethyl chloride.   ?With sterile technique and under real time ultrasound guidance: Zilretta 32 mg injected into knee joint. Fluid seen entering the joint capsule.   ?Completed without difficulty   ?\Spinal needle used ?Advised to call if fevers/chills, erythema, induration, drainage, or persistent bleeding.   ?Images permanently stored and available for review in the ultrasound unit.  ?Impression: Technically successful ultrasound guided injection. ? ? ? ?Procedure: Real-time Ultrasound Guided Injection of left knee superior lateral patellar space ?Device: Philips Affiniti 50G ?Images permanently stored and available for review in PACS ?Verbal informed consent obtained.  Discussed risks and benefits of procedure. Warned about infection, bleeding, hyperglycemia damage to structures among others. ?Patient expresses understanding and agreement ?Time-out conducted.   ?Noted no overlying erythema, induration, or other signs of local infection.   ?Skin prepped in a sterile fashion.   ?Local anesthesia: Topical Ethyl chloride.   ?With sterile technique and under real time ultrasound guidance: Zilretta 32 mg injected into knee joint. Fluid seen entering the joint capsule.   ?Completed without difficulty   ?Spinal needle used ?Advised to call if fevers/chills, erythema, induration, drainage, or  persistent bleeding.   ?Images permanently stored and available for review in the ultrasound unit.  ?Impression: Technically successful ultrasound guided injection. ? ? ?Lot number: [id number] ? ?

## 2022-02-21 NOTE — Patient Instructions (Signed)
Good to see you today. ? ?You had B Zilretta injections.  Call or go to the ER if you develop a large red swollen joint with extreme pain or oozing puss.  ? ?Follow-up as needed. ?

## 2022-02-27 ENCOUNTER — Encounter (INDEPENDENT_AMBULATORY_CARE_PROVIDER_SITE_OTHER): Payer: Self-pay | Admitting: Family Medicine

## 2022-02-27 ENCOUNTER — Ambulatory Visit (INDEPENDENT_AMBULATORY_CARE_PROVIDER_SITE_OTHER): Payer: BC Managed Care – PPO | Admitting: Family Medicine

## 2022-02-27 VITALS — BP 136/84 | HR 78 | Temp 98.0°F | Ht 67.0 in | Wt 322.0 lb

## 2022-02-27 DIAGNOSIS — I10 Essential (primary) hypertension: Secondary | ICD-10-CM | POA: Diagnosis not present

## 2022-02-27 DIAGNOSIS — E559 Vitamin D deficiency, unspecified: Secondary | ICD-10-CM | POA: Diagnosis not present

## 2022-02-27 DIAGNOSIS — Z6841 Body Mass Index (BMI) 40.0 and over, adult: Secondary | ICD-10-CM

## 2022-02-27 DIAGNOSIS — Z9189 Other specified personal risk factors, not elsewhere classified: Secondary | ICD-10-CM | POA: Insufficient documentation

## 2022-02-27 DIAGNOSIS — R7303 Prediabetes: Secondary | ICD-10-CM

## 2022-02-27 DIAGNOSIS — E669 Obesity, unspecified: Secondary | ICD-10-CM

## 2022-02-27 MED ORDER — OZEMPIC (0.25 OR 0.5 MG/DOSE) 2 MG/1.5ML ~~LOC~~ SOPN: 0.5000 mg | PEN_INJECTOR | SUBCUTANEOUS | 0 refills | Status: DC

## 2022-03-03 ENCOUNTER — Other Ambulatory Visit (INDEPENDENT_AMBULATORY_CARE_PROVIDER_SITE_OTHER): Payer: Self-pay | Admitting: Family Medicine

## 2022-03-03 DIAGNOSIS — M170 Bilateral primary osteoarthritis of knee: Secondary | ICD-10-CM

## 2022-03-05 ENCOUNTER — Other Ambulatory Visit (INDEPENDENT_AMBULATORY_CARE_PROVIDER_SITE_OTHER): Payer: Self-pay | Admitting: Family Medicine

## 2022-03-05 DIAGNOSIS — M170 Bilateral primary osteoarthritis of knee: Secondary | ICD-10-CM

## 2022-03-05 NOTE — Telephone Encounter (Signed)
Dr.Beasley 

## 2022-03-06 MED ORDER — MELOXICAM 7.5 MG PO TABS: 7.5000 mg | ORAL_TABLET | Freq: Every day | ORAL | 0 refills | Status: DC

## 2022-03-06 NOTE — Telephone Encounter (Signed)
LAST APPOINTMENT DATE: 02/27/2022 with Dr. Dalbert Garnet ?NEXT APPOINTMENT DATE: 03/21/2022 with Dr. Dalbert Garnet ? ? ?[id number] [place], [place] - [address] ?[address] ?[place] [address] ?Phone: [phone] Fax: [phone] ? ?[facility] - [place], [place] - [address] ?[address] ?[place] [address] ?Phone: [phone] Fax: [phone] ? ?Patient is requesting a refill of the following medications: ?Requested Prescriptions  ? ?Pending Prescriptions Disp Refills  ? meloxicam (MOBIC) 7.5 MG tablet 30 tablet 0  ?  Sig: Take 1 tablet (7.5 mg total) by mouth daily.  ? ? ?Date last filled: 01/16/2022 #30 ?Previously prescribed by Dalbert Garnet ? ?Lab Results  ?Component Value Date  ? HGBA1C 5.6 02/06/2022  ? HGBA1C 5.9 (H) 06/18/2021  ? HGBA1C 5.9 02/06/2021  ? ?Lab Results  ?Component Value Date  ? LDLCALC 81 12/27/2020  ? CREATININE 0.71 02/06/2022  ? ?Lab Results  ?Component Value Date  ? VD25OH 46.7 02/06/2022  ? VD25OH 76.3 06/18/2021  ? VD25OH 53.2 02/06/2021  ? ? ?BP Readings from Last 3 Encounters:  ?02/27/22 136/84  ?02/06/22 132/75  ?01/31/22 137/83  ? ? ?

## 2022-03-11 ENCOUNTER — Other Ambulatory Visit (INDEPENDENT_AMBULATORY_CARE_PROVIDER_SITE_OTHER): Payer: Self-pay | Admitting: Family Medicine

## 2022-03-11 DIAGNOSIS — R7303 Prediabetes: Secondary | ICD-10-CM

## 2022-03-12 NOTE — Progress Notes (Signed)
      GYNECOLOGY OFFICE PROCEDURE NOTE   Laura Knox is a 56 y.o. G0P0000 here for endometrial biopsy for  PCB . Of note, pap on 01/31/2022 was normal, negative HPV. GC/CT negative at that time. She is on estradiol patch - no progesterone. She thinks she may have run out ( we will resume rx).    ENDOMETRIAL BIOPSY     The indications for endometrial biopsy were reviewed.   Risks of the biopsy including cramping, bleeding, infection, uterine perforation, inadequate specimen and need for additional procedures were discussed. Offered alternative of hysteroscopy, dilation and curettage in OR. The patient states she understands the R/B/I/A and agrees to undergo procedure today. Urine pregnancy test was Not indicated. Consent was signed. Time out was performed.    Patient was positioned in dorsal lithotomy position. A vaginal speculum was placed.  The cervix was visualized and was prepped with Betadine.  A single-toothed tenaculum was placed on the anterior lip of the cervix to stabilize it. The pipelle would not pass through the os. I then used the lacrimal duct dilators we had the office but the smallest dilator would not pass through the cervix. I then attempted to use a spinal needle and pierced the os with it x2 and then tried the dilators again without success. Additionally I tried the os finders but without success. After these were attempted repeated times, I stopped the procedure.  The instruments were removed from the patient's vagina. Minimal bleeding from the cervix was noted. The patient tolerated the procedure well and I was not limited by her discomfort.   We discussed doing a TVUS. If EL is thickened on EL, then we discussed D&C in the OR, but we will discuss in detail once I have [place] results.    Patient was given post procedure instructions.  Will follow up pathology and manage accordingly; patient will be contacted with results and recommendations.  Routine preventative health maintenance  measures emphasized.       Milas Hock, MD, FACOG Obstetrician & Gynecologist, [name] for [facility], [facility]

## 2022-03-13 NOTE — Progress Notes (Signed)
Chief Complaint:   OBESITY Laura Knox is here to discuss her progress with her obesity treatment plan along with follow-up of her obesity related diagnoses. Laura Knox is on the Category 4 Plan or following a lower carbohydrate, vegetable and lean protein rich diet plan and states she is following her eating plan approximately 50% of the time. Laura Knox states she is doing water aerobics for 60 minutes 3 times per week.  Today's visit was #: 23 Starting weight: 378 lbs Starting date: 12/27/2020 Today's weight: 322 lbs Today's date: 02/27/2022 Total lbs lost to date: 56 Total lbs lost since last in-office visit: 6  Interim History: Laura Knox continues to do well with weight loss. She is working on following a lower carbohydrate plan.   Subjective:   1. Hypertension, unspecified type Laura Knox is on losartan and hydrochlorothiazide. She is working on weight loss and is doing well.   2. Pre-diabetes Laura Knox is stable on Ozempic at 0.5 mg. I discussed labs with the patient today.  3. Vitamin D deficiency Laura Knox's Vitamin D level has decreased just a bit below goal. She was getting close to being over-replaced previously. I discussed labs with the patient today.  4. At risk for diabetes mellitus Laura Knox is at higher than average risk for developing diabetes due to her obesity.  Assessment/Plan:   1. Hypertension, unspecified type Laura Knox will continue her diet and exercise, and will continue to monitor.   2. Pre-diabetes Laura Knox will continue her medications, and we will refill Ozempic 1 mg for 1 month (patient is to continue at 0.5 mg).   - Semaglutide,0.25 or 0.5MG /DOS, (OZEMPIC, 0.25 OR 0.5 MG/DOSE,) 2 MG/1.5ML SOPN; Inject 0.5 mg into the skin once a week.  Dispense: 1.5 mL; Refill: 0  3. Vitamin D deficiency Laura Knox will continue taking multivitamin with Vitamin D and we will recheck labs in 3 months.   4. At risk for diabetes mellitus Laura Knox was given approximately 15 minutes of diabetic education and  counseling today. We discussed intensive lifestyle modifications today with an emphasis on weight loss as well as increasing exercise and decreasing simple carbohydrates in her diet. We also reviewed medication options with an emphasis on risk versus benefits of those discussed.  Repetitive spaced learning was employed today to elicit superior memory formation and behavioral change.  5. Obesity, Current BMI 50.6 Laura Knox is currently in the action stage of change. As such, her goal is to continue with weight loss efforts. She has agreed to following a lower carbohydrate, vegetable and lean protein rich diet plan.   Exercise goals: As is.  Behavioral modification strategies: increasing lean protein intake.  Laura Knox has agreed to follow-up with our clinic in 3 weeks. She was informed of the importance of frequent follow-up visits to maximize her success with intensive lifestyle modifications for her multiple health conditions.   Objective:   Blood pressure 136/84, pulse 78, temperature 98 F (36.7 C), height 5\' 7"  (1.702 m), weight (!) 322 lb (146.1 kg), SpO2 98 %. Body mass index is 50.43 kg/m.  General: Cooperative, alert, well developed, in no acute distress. HEENT: Conjunctivae and lids unremarkable. Cardiovascular: Regular rhythm.  Lungs: Normal work of breathing. Neurologic: No focal deficits.   Lab Results  Component Value Date   CREATININE 0.71 02/06/2022   BUN 20 02/06/2022   NA 141 02/06/2022   K 4.1 02/06/2022   CL 101 02/06/2022   CO2 26 02/06/2022   Lab Results  Component Value Date   ALT 11 02/06/2022  AST 14 02/06/2022   ALKPHOS 102 02/06/2022   BILITOT 0.3 02/06/2022   Lab Results  Component Value Date   HGBA1C 5.6 02/06/2022   HGBA1C 5.9 (H) 06/18/2021   HGBA1C 5.9 02/06/2021   HGBA1C 6.0 (H) 12/27/2020   Lab Results  Component Value Date   INSULIN 16.7 06/18/2021   INSULIN 20.4 12/27/2020   Lab Results  Component Value Date   TSH 3.41 02/06/2021    Lab Results  Component Value Date   CHOL 141 12/27/2020   HDL 44 12/27/2020   LDLCALC 81 12/27/2020   TRIG 80 12/27/2020   Lab Results  Component Value Date   VD25OH 46.7 02/06/2022   VD25OH 76.3 06/18/2021   VD25OH 53.2 02/06/2021   Lab Results  Component Value Date   WBC 8.4 02/06/2021   HGB 13.8 02/06/2021   HCT 42 02/06/2021   MCV 87 12/27/2020   PLT 336 02/06/2021   Lab Results  Component Value Date   FERRITIN 88 02/06/2021   Attestation Statements:   Reviewed by clinician on day of visit: allergies, medications, problem list, medical history, surgical history, family history, social history, and previous encounter notes.   I, Burt Knack, am acting as transcriptionist for Quillian Quince, MD.  I have reviewed the above documentation for accuracy and completeness, and I agree with the above. -  Quillian Quince, MD

## 2022-03-14 ENCOUNTER — Ambulatory Visit: Payer: BC Managed Care – PPO | Admitting: Obstetrics and Gynecology

## 2022-03-14 ENCOUNTER — Encounter: Payer: Self-pay | Admitting: Obstetrics and Gynecology

## 2022-03-14 VITALS — BP 146/81 | HR 86 | Ht 67.0 in | Wt 323.0 lb

## 2022-03-14 DIAGNOSIS — N882 Stricture and stenosis of cervix uteri: Secondary | ICD-10-CM

## 2022-03-14 DIAGNOSIS — Z79890 Hormone replacement therapy (postmenopausal): Secondary | ICD-10-CM | POA: Diagnosis not present

## 2022-03-14 DIAGNOSIS — N930 Postcoital and contact bleeding: Secondary | ICD-10-CM

## 2022-03-14 MED ORDER — PROGESTERONE MICRONIZED 100 MG PO CAPS: 100.0000 mg | ORAL_CAPSULE | Freq: Every day | ORAL | 3 refills | Status: DC

## 2022-03-19 ENCOUNTER — Ambulatory Visit (INDEPENDENT_AMBULATORY_CARE_PROVIDER_SITE_OTHER): Payer: BC Managed Care – PPO

## 2022-03-19 ENCOUNTER — Other Ambulatory Visit: Payer: Self-pay | Admitting: Obstetrics and Gynecology

## 2022-03-19 DIAGNOSIS — N930 Postcoital and contact bleeding: Secondary | ICD-10-CM

## 2022-03-19 DIAGNOSIS — N950 Postmenopausal bleeding: Secondary | ICD-10-CM | POA: Diagnosis not present

## 2022-03-20 DIAGNOSIS — N200 Calculus of kidney: Secondary | ICD-10-CM | POA: Diagnosis not present

## 2022-03-21 ENCOUNTER — Ambulatory Visit: Payer: Self-pay | Admitting: Family Medicine

## 2022-03-21 ENCOUNTER — Ambulatory Visit (INDEPENDENT_AMBULATORY_CARE_PROVIDER_SITE_OTHER): Payer: BC Managed Care – PPO | Admitting: Family Medicine

## 2022-03-21 ENCOUNTER — Encounter (INDEPENDENT_AMBULATORY_CARE_PROVIDER_SITE_OTHER): Payer: Self-pay | Admitting: Family Medicine

## 2022-03-21 ENCOUNTER — Encounter: Payer: Self-pay | Admitting: Family Medicine

## 2022-03-21 VITALS — BP 122/78 | HR 74 | Temp 98.2°F | Ht 67.0 in | Wt 318.0 lb

## 2022-03-21 DIAGNOSIS — E669 Obesity, unspecified: Secondary | ICD-10-CM

## 2022-03-21 DIAGNOSIS — Z9189 Other specified personal risk factors, not elsewhere classified: Secondary | ICD-10-CM

## 2022-03-21 DIAGNOSIS — M7660 Achilles tendinitis, unspecified leg: Secondary | ICD-10-CM | POA: Insufficient documentation

## 2022-03-21 DIAGNOSIS — R7303 Prediabetes: Secondary | ICD-10-CM | POA: Diagnosis not present

## 2022-03-21 DIAGNOSIS — Z6841 Body Mass Index (BMI) 40.0 and over, adult: Secondary | ICD-10-CM

## 2022-03-21 DIAGNOSIS — I10 Essential (primary) hypertension: Secondary | ICD-10-CM | POA: Diagnosis not present

## 2022-03-21 DIAGNOSIS — M7661 Achilles tendinitis, right leg: Secondary | ICD-10-CM

## 2022-03-21 DIAGNOSIS — Z7984 Long term (current) use of oral hypoglycemic drugs: Secondary | ICD-10-CM

## 2022-03-21 MED ORDER — METFORMIN HCL 500 MG PO TABS: ORAL_TABLET | ORAL | 0 refills | Status: DC

## 2022-03-21 NOTE — Progress Notes (Signed)
  Laura Knox - 55 y.o. female MRN [id number]  Date of birth: 1965/12/12  SUBJECTIVE:  Including CC & ROS.  No chief complaint on file.   Laura Knox is a 56 y.o. female that is here for shockwave therapy.   Review of Systems See HPI   HISTORY: Past Medical, Surgical, Social, and Family History Reviewed & Updated per EMR.   Pertinent Historical Findings include:  Past Medical History:  Diagnosis Date   B12 deficiency    Bilateral swelling of feet and ankles    Hypertension    Joint pain    Kidney stones    Knee pain    Osteoarthritis    Other fatigue    Prediabetes    Sleep apnea    Thyroid disease    Vitamin D deficiency     Past Surgical History:  Procedure Laterality Date   COLONOSCOPY WITH PROPOFOL N/A 04/10/2021   Procedure: COLONOSCOPY WITH PROPOFOL;  Surgeon: Lynann Bologna, MD;  Location: [facility];  Service: Endoscopy;  Laterality: N/A;   KIDNEY STONE SURGERY     miniscus repair Left    POLYPECTOMY  04/10/2021   Procedure: POLYPECTOMY;  Surgeon: Lynann Bologna, MD;  Location: [facility];  Service: Endoscopy;;   SEPTOPLASTY       PHYSICAL EXAM:  VS: BP 136/88 (BP Location: Left Arm, Patient Position: Sitting)   Ht 5\' 7"  (1.702 m)   Wt (!) 323 lb (146.5 kg)   BMI 50.59 kg/m  Physical Exam Gen: NAD, alert, cooperative with exam, well-appearing MSK:  Neurovascularly intact    ECSWT Note Laura Knox 02-Sep-1966  Procedure: ECSWT Indications: right ankle pain   Procedure Details Consent: Risks of procedure as well as the alternatives and risks of each were explained to the (patient/caregiver).  Consent for procedure obtained. Time Out: Verified patient identification, verified procedure, site/side was marked, verified correct patient position, special equipment/implants available, medications/allergies/relevent history reviewed, required imaging and test results available.  Performed.  The area was cleaned with iodine and alcohol swabs.    The right  ankle was targeted for Extracorporeal shockwave therapy.   Preset: achillodynia  Power Level: 60 Frequency: 8 Impulse/cycles: 2600 Head size: medium  Session: 9  Patient did tolerate procedure well.    ASSESSMENT & PLAN:   Achilles tendinitis Completed shockwave therapy

## 2022-03-21 NOTE — Assessment & Plan Note (Signed)
Completed shockwave therapy  

## 2022-03-31 ENCOUNTER — Other Ambulatory Visit: Payer: Self-pay | Admitting: Gastroenterology

## 2022-04-02 NOTE — Progress Notes (Signed)
Chief Complaint:   OBESITY Laura Knox is here to discuss her progress with her obesity treatment plan along with follow-up of her obesity related diagnoses. [name] is on following a lower carbohydrate, vegetable and lean protein rich diet plan and states she is following her eating plan approximately 50% of the time. [name] states she is doing water and light aerobics for 60 minutes 6 times per week.  Today's visit was #: 24 Starting weight: 378 lbs Starting date: 12/27/2020 Today's weight: 318 lbs Today's date: 03/21/2022 Total lbs lost to date: 60 Total lbs lost since last in-office visit: 4  Interim History: [name] continues to do well with weight loss on her plan. She will be doing a lot of socializing these next 3 weeks. She is active but working on increasing exercise.   Subjective:   1. Pre-diabetes Laura Knox is doing very well with her diet, exercise, and weight loss. She is tolerating her medications well. No side effects were noted.   2. Essential hypertension Laura Knox's blood pressure is well controlled with her medications and weight loss. No side effects were noted.   3. At risk for impaired metabolic function Laura Knox is at increased risk for impaired metabolic function if calories/protein are too low.  Assessment/Plan:   1. Pre-diabetes We will refill metformin for 1 month. Bayler will continue Ozempic at 0.5 mg q week, and we will continue to follow.  - metFORMIN (GLUCOPHAGE) 500 MG tablet; 2 po with lunch qd  Dispense: 60 tablet; Refill: 0  2. Essential hypertension [place] will continue her medications, diet, and exercise as is, and will continue to monitor closely.  3. At risk for impaired metabolic function Laura Knox was given approximately 15 minutes of impaired  metabolic function prevention counseling today. We discussed intensive lifestyle modifications today with an emphasis on specific nutrition and exercise instructions and strategies.   Repetitive spaced learning was  employed today to elicit superior memory formation and behavioral change.  4. Obesity, Current BMI 49.8 Laura Knox is currently in the action stage of change. As such, her goal is to continue with weight loss efforts. She has agreed to following a lower carbohydrate, vegetable and lean protein rich diet plan.   Exercise goals: As is.  Behavioral modification strategies: increasing lean protein intake and meal planning and cooking strategies.  Laura Knox has agreed to follow-up with our clinic in 3 weeks. She was informed of the importance of frequent follow-up visits to maximize her success with intensive lifestyle modifications for her multiple health conditions.   Objective:   Blood pressure 122/78, pulse 74, temperature 98.2 F (36.8 C), height 5\' 7"  (1.702 m), weight (!) 318 lb (144.2 kg), SpO2 97 %. Body mass index is 49.81 kg/m.  General: Cooperative, alert, well developed, in no acute distress. HEENT: Conjunctivae and lids unremarkable. Cardiovascular: Regular rhythm.  Lungs: Normal work of breathing. Neurologic: No focal deficits.   Lab Results  Component Value Date   CREATININE 0.71 02/06/2022   BUN 20 02/06/2022   NA 141 02/06/2022   K 4.1 02/06/2022   CL 101 02/06/2022   CO2 26 02/06/2022   Lab Results  Component Value Date   ALT 11 02/06/2022   AST 14 02/06/2022   ALKPHOS 102 02/06/2022   BILITOT 0.3 02/06/2022   Lab Results  Component Value Date   HGBA1C 5.6 02/06/2022   HGBA1C 5.9 (H) 06/18/2021   HGBA1C 5.9 02/06/2021   HGBA1C 6.0 (H) 12/27/2020   Lab Results  Component Value Date  INSULIN 16.7 06/18/2021   INSULIN 20.4 12/27/2020   Lab Results  Component Value Date   TSH 3.41 02/06/2021   Lab Results  Component Value Date   CHOL 141 12/27/2020   HDL 44 12/27/2020   LDLCALC 81 12/27/2020   TRIG 80 12/27/2020   Lab Results  Component Value Date   VD25OH 46.7 02/06/2022   VD25OH 76.3 06/18/2021   VD25OH 53.2 02/06/2021   Lab Results  Component  Value Date   WBC 8.4 02/06/2021   HGB 13.8 02/06/2021   HCT 42 02/06/2021   MCV 87 12/27/2020   PLT 336 02/06/2021   Lab Results  Component Value Date   FERRITIN 88 02/06/2021   Attestation Statements:   Reviewed by clinician on day of visit: allergies, medications, problem list, medical history, surgical history, family history, social history, and previous encounter notes.   I, Burt Knack, am acting as transcriptionist for Quillian Quince, MD.  I have reviewed the above documentation for accuracy and completeness, and I agree with the above. -  Quillian Quince, MD

## 2022-04-03 ENCOUNTER — Other Ambulatory Visit (INDEPENDENT_AMBULATORY_CARE_PROVIDER_SITE_OTHER): Payer: Self-pay | Admitting: Family Medicine

## 2022-04-03 DIAGNOSIS — M170 Bilateral primary osteoarthritis of knee: Secondary | ICD-10-CM

## 2022-04-04 ENCOUNTER — Encounter: Payer: Self-pay | Admitting: Family Medicine

## 2022-04-09 ENCOUNTER — Other Ambulatory Visit (INDEPENDENT_AMBULATORY_CARE_PROVIDER_SITE_OTHER): Payer: Self-pay | Admitting: Family Medicine

## 2022-04-09 DIAGNOSIS — M170 Bilateral primary osteoarthritis of knee: Secondary | ICD-10-CM

## 2022-04-09 NOTE — Telephone Encounter (Signed)
Dr.Beasley 

## 2022-04-09 NOTE — Telephone Encounter (Signed)
LAST APPOINTMENT DATE: 03/21/2022 NEXT APPOINTMENT DATE: 04/11/2022   [id number] - [place] - [id number] [place] [address] Phone: [phone] Fax: [phone]  [facility] [place], [place] [address] [place] [address] Phone: [phone] Fax: [phone]  Patient is requesting a refill of the following medications: Requested Prescriptions   Pending Prescriptions Disp Refills   meloxicam (MOBIC) 7.5 MG tablet 30 tablet 0    Sig: Take 1 tablet (7.5 mg total) by mouth daily.    Date last filled: 03/06/2022 Previously prescribed by [facility]  Lab Results  Component Value Date   HGBA1C 5.6 02/06/2022   HGBA1C 5.9 (H) 06/18/2021   HGBA1C 5.9 02/06/2021   Lab Results  Component Value Date   LDLCALC 81 12/27/2020   CREATININE 0.71 02/06/2022   Lab Results  Component Value Date   VD25OH 46.7 02/06/2022   VD25OH 76.3 06/18/2021   VD25OH 53.2 02/06/2021    BP Readings from Last 3 Encounters:  03/21/22 122/78  03/21/22 136/88  03/14/22 (!) 146/81

## 2022-04-11 ENCOUNTER — Ambulatory Visit (INDEPENDENT_AMBULATORY_CARE_PROVIDER_SITE_OTHER): Payer: BC Managed Care – PPO | Admitting: Family Medicine

## 2022-04-11 ENCOUNTER — Encounter (INDEPENDENT_AMBULATORY_CARE_PROVIDER_SITE_OTHER): Payer: Self-pay | Admitting: Family Medicine

## 2022-04-11 VITALS — BP 109/70 | HR 77 | Temp 98.3°F | Ht 67.0 in | Wt 315.0 lb

## 2022-04-11 DIAGNOSIS — E669 Obesity, unspecified: Secondary | ICD-10-CM | POA: Diagnosis not present

## 2022-04-11 DIAGNOSIS — R7303 Prediabetes: Secondary | ICD-10-CM

## 2022-04-11 DIAGNOSIS — M170 Bilateral primary osteoarthritis of knee: Secondary | ICD-10-CM | POA: Diagnosis not present

## 2022-04-11 DIAGNOSIS — Z6841 Body Mass Index (BMI) 40.0 and over, adult: Secondary | ICD-10-CM | POA: Diagnosis not present

## 2022-04-11 DIAGNOSIS — Z713 Dietary counseling and surveillance: Secondary | ICD-10-CM

## 2022-04-11 MED ORDER — OZEMPIC (0.25 OR 0.5 MG/DOSE) 2 MG/1.5ML ~~LOC~~ SOPN: 0.5000 mg | PEN_INJECTOR | SUBCUTANEOUS | 0 refills | Status: DC

## 2022-04-11 MED ORDER — MELOXICAM 7.5 MG PO TABS: 7.5000 mg | ORAL_TABLET | Freq: Every day | ORAL | 0 refills | Status: DC

## 2022-04-15 NOTE — Progress Notes (Signed)
Chief Complaint:   OBESITY Laura Knox is here to discuss her progress with her obesity treatment plan along with follow-up of her obesity related diagnoses. Laura Knox is on following a lower carbohydrate, vegetable and lean protein rich diet plan and states she is following her eating plan approximately 50% of the time. Laura Knox states she is doing water and silver sneakers for 60 minutes 6 times per week.  Today's visit was #: 25 Starting weight: 378 lbs Starting date: 12/27/2020 Today's weight: 315 lbs Today's date: 04/11/2022 Total lbs lost to date: 63 Total lbs lost since last in-office visit: 3  Interim History: Laura Knox continues to do well with weight loss on her lower carbohydrate plan.  Her hunger is controlled and she is making good choices.  She is especially active now that her knee pain has improved.  Subjective:   1. Osteoarthritis of both knees, unspecified osteoarthritis type Laura Knox's pain has improved after injections, and she is able to exercise again.  2. Pre-diabetes Laura Knox is doing well on her diet and with weight loss.  Her recent A1c is well controlled.  Assessment/Plan:   1. Osteoarthritis of both knees, unspecified osteoarthritis type Laura Knox will continue Mobic 7.5 mg daily, and we will refill for 90 days.  - meloxicam (MOBIC) 7.5 MG tablet; Take 1 tablet (7.5 mg total) by mouth daily.  Dispense: 90 tablet; Refill: 0  2. Pre-diabetes We will refill Ozempic 0.5 mg weekly for 1 month.  Laura Knox will continue to work on weight loss, exercise, and decreasing simple carbohydrates to help decrease the risk of diabetes.   - Semaglutide,0.25 or 0.5MG /DOS, (OZEMPIC, 0.25 OR 0.5 MG/DOSE,) 2 MG/1.5ML SOPN; Inject 0.5 mg into the skin once a week.  Dispense: 1.5 mL; Refill: 0  3. Encounter for dietary counseling and surveillance Patient was counseled on diet and nutrition and physical activity as documented, including a discussion of current behaviors with appropriate educational  materials that were given for up to 15 minutes today.  4. Obesity, Current BMI 49.4 Laura Knox is currently in the action stage of change. As such, her goal is to continue with weight loss efforts. She has agreed to following a lower carbohydrate, vegetable and lean protein rich diet plan.   Exercise goals: As is.   Behavioral modification strategies: increasing lean protein intake and meal planning and cooking strategies.  Laura Knox has agreed to follow-up with our clinic in 3 weeks. She was informed of the importance of frequent follow-up visits to maximize her success with intensive lifestyle modifications for her multiple health conditions.   Objective:   Blood pressure 109/70, pulse 77, temperature 98.3 F (36.8 C), height 5\' 7"  (1.702 m), weight (!) 315 lb (142.9 kg), SpO2 97 %. Body mass index is 49.34 kg/m.  General: Cooperative, alert, well developed, in no acute distress. HEENT: Conjunctivae and lids unremarkable. Cardiovascular: Regular rhythm.  Lungs: Normal work of breathing. Neurologic: No focal deficits.   Lab Results  Component Value Date   CREATININE 0.71 02/06/2022   BUN 20 02/06/2022   NA 141 02/06/2022   K 4.1 02/06/2022   CL 101 02/06/2022   CO2 26 02/06/2022   Lab Results  Component Value Date   ALT 11 02/06/2022   AST 14 02/06/2022   ALKPHOS 102 02/06/2022   BILITOT 0.3 02/06/2022   Lab Results  Component Value Date   HGBA1C 5.6 02/06/2022   HGBA1C 5.9 (H) 06/18/2021   HGBA1C 5.9 02/06/2021   HGBA1C 6.0 (H) 12/27/2020   Lab  Results  Component Value Date   INSULIN 16.7 06/18/2021   INSULIN 20.4 12/27/2020   Lab Results  Component Value Date   TSH 3.41 02/06/2021   Lab Results  Component Value Date   CHOL 141 12/27/2020   HDL 44 12/27/2020   LDLCALC 81 12/27/2020   TRIG 80 12/27/2020   Lab Results  Component Value Date   VD25OH 46.7 02/06/2022   VD25OH 76.3 06/18/2021   VD25OH 53.2 02/06/2021   Lab Results  Component Value Date   WBC  8.4 02/06/2021   HGB 13.8 02/06/2021   HCT 42 02/06/2021   MCV 87 12/27/2020   PLT 336 02/06/2021   Lab Results  Component Value Date   FERRITIN 88 02/06/2021   Attestation Statements:   Reviewed by clinician on day of visit: allergies, medications, problem list, medical history, surgical history, family history, social history, and previous encounter notes.   I, Burt Knack, am acting as transcriptionist for Quillian Quince, MD.  I have reviewed the above documentation for accuracy and completeness, and I agree with the above. -  Quillian Quince, MD

## 2022-04-24 ENCOUNTER — Telehealth (INDEPENDENT_AMBULATORY_CARE_PROVIDER_SITE_OTHER): Payer: Self-pay | Admitting: Family Medicine

## 2022-04-24 ENCOUNTER — Encounter: Payer: Self-pay | Admitting: Medical

## 2022-04-24 ENCOUNTER — Encounter (INDEPENDENT_AMBULATORY_CARE_PROVIDER_SITE_OTHER): Payer: Self-pay | Admitting: Family Medicine

## 2022-04-24 ENCOUNTER — Encounter (INDEPENDENT_AMBULATORY_CARE_PROVIDER_SITE_OTHER): Payer: Self-pay

## 2022-04-24 NOTE — Telephone Encounter (Signed)
Dr. Beasley - Prior authorization denied for Ozempic. Per insurance: Patient does not have type 2 diabetes. Patient sent denial message via mychart.  

## 2022-04-25 MED ORDER — ESTRADIOL 0.1 MG/24HR TD PTTW: 1.0000 | MEDICATED_PATCH | TRANSDERMAL | 2 refills | Status: DC

## 2022-05-02 ENCOUNTER — Encounter (INDEPENDENT_AMBULATORY_CARE_PROVIDER_SITE_OTHER): Payer: Self-pay | Admitting: Family Medicine

## 2022-05-02 ENCOUNTER — Ambulatory Visit (INDEPENDENT_AMBULATORY_CARE_PROVIDER_SITE_OTHER): Payer: BC Managed Care – PPO | Admitting: Family Medicine

## 2022-05-02 VITALS — BP 136/82 | HR 75 | Temp 98.2°F | Ht 67.0 in | Wt 318.0 lb

## 2022-05-02 DIAGNOSIS — R7303 Prediabetes: Secondary | ICD-10-CM

## 2022-05-02 DIAGNOSIS — Z6841 Body Mass Index (BMI) 40.0 and over, adult: Secondary | ICD-10-CM | POA: Diagnosis not present

## 2022-05-02 DIAGNOSIS — M170 Bilateral primary osteoarthritis of knee: Secondary | ICD-10-CM | POA: Diagnosis not present

## 2022-05-02 DIAGNOSIS — E669 Obesity, unspecified: Secondary | ICD-10-CM

## 2022-05-02 DIAGNOSIS — Z7984 Long term (current) use of oral hypoglycemic drugs: Secondary | ICD-10-CM

## 2022-05-02 MED ORDER — METFORMIN HCL 500 MG PO TABS: ORAL_TABLET | ORAL | 0 refills | Status: DC

## 2022-05-02 MED ORDER — LOMAIRA 8 MG PO TABS: 8.0000 mg | ORAL_TABLET | Freq: Every morning | ORAL | 0 refills | Status: DC

## 2022-05-06 NOTE — Progress Notes (Signed)
Chief Complaint:   OBESITY Laura Knox is here to discuss her progress with her obesity treatment plan along with follow-up of her obesity related diagnoses. Laura Knox is on following a lower carbohydrate, vegetable and lean protein rich diet plan and states she is following her eating plan approximately 50% of the time. Laura Knox states she is doing Silver sneakers/water exercise for 60 minutes 6 times per week.  Today's visit was #: 26 Starting weight: 378 lbs Starting date: 12/27/2020 Today's weight: 318 lbs Today's date: 05/02/2022 Total lbs lost to date: 60 Total lbs lost since last in-office visit: 0  Interim History: Laura Knox has been off her eating plan for 1 month with vacation, increased ice cream intake, and increased appetite off Ozempic 0.5 mg due to increased cost.  Her knee pain has improved, and she is consistent with exercise.  She eats 2 meals and 1-2 snacks.  She is getting protein in with meals.  She likes fruits and veggies.  She has been eating out more often.  Subjective:   1. Pre-diabetes Laura Knox's A1c has decreased to 5.6 from 5.9 on her last check on 02/06/2022.  She has been off Ozempic due to cost.  2. Osteoarthritis of both knees, unspecified osteoarthritis type Laura Knox notes her knee pain is improving.  Assessment/Plan:   1. Pre-diabetes Laura Knox will continue her low sugar/low carbohydrate diet, regular exercise, and ice cream frequency.  She will continue metformin 500 mg 2 tablets with lunch, and we will refill for 1 month.  - metFORMIN (GLUCOPHAGE) 500 MG tablet; 2 po with lunch qd  Dispense: 60 tablet; Refill: 0  2. Osteoarthritis of both knees, unspecified osteoarthritis type Laura Knox will continue Mobic 7.5 mg daily with food, and she will continue to work on weight loss.  3. Obesity, Current BMI 49.9 Laura Knox is currently in the action stage of change. As such, her goal is to continue with weight loss efforts. She has agreed to following a lower carbohydrate, vegetable  and lean protein rich diet plan.   We discussed various medication options to help Laura Knox with her weight loss efforts and we both agreed to start Lomaria 8 mg every morning, with no refills; she is to take 30 minutes before her first meal of the day and use with low carb/calorie restricted diet.  Medication contract signed.  - Phentermine HCl (LOMAIRA) 8 MG TABS; Take 8 mg by mouth every morning. Before 1st meal of the day.  Dispense: 30 tablet; Refill: 0  Exercise goals: As is.   Behavioral modification strategies: decreasing eating out, keeping healthy foods in the home, and decreasing junk food.  Laura Knox has agreed to follow-up with our clinic in 3 weeks. She was informed of the importance of frequent follow-up visits to maximize her success with intensive lifestyle modifications for her multiple health conditions.   Objective:   Blood pressure 136/82, pulse 75, temperature 98.2 F (36.8 C), height 5\' 7"  (1.702 m), weight (!) 318 lb (144.2 kg), SpO2 95 %. Body mass index is 49.81 kg/m.  General: Cooperative, alert, well developed, in no acute distress. HEENT: Conjunctivae and lids unremarkable. Cardiovascular: Regular rhythm.  Lungs: Normal work of breathing. Neurologic: No focal deficits.   Lab Results  Component Value Date   CREATININE 0.71 02/06/2022   BUN 20 02/06/2022   NA 141 02/06/2022   K 4.1 02/06/2022   CL 101 02/06/2022   CO2 26 02/06/2022   Lab Results  Component Value Date   ALT 11 02/06/2022  AST 14 02/06/2022   ALKPHOS 102 02/06/2022   BILITOT 0.3 02/06/2022   Lab Results  Component Value Date   HGBA1C 5.6 02/06/2022   HGBA1C 5.9 (H) 06/18/2021   HGBA1C 5.9 02/06/2021   HGBA1C 6.0 (H) 12/27/2020   Lab Results  Component Value Date   INSULIN 16.7 06/18/2021   INSULIN 20.4 12/27/2020   Lab Results  Component Value Date   TSH 3.41 02/06/2021   Lab Results  Component Value Date   CHOL 141 12/27/2020   HDL 44 12/27/2020   LDLCALC 81 12/27/2020    TRIG 80 12/27/2020   Lab Results  Component Value Date   VD25OH 46.7 02/06/2022   VD25OH 76.3 06/18/2021   VD25OH 53.2 02/06/2021   Lab Results  Component Value Date   WBC 8.4 02/06/2021   HGB 13.8 02/06/2021   HCT 42 02/06/2021   MCV 87 12/27/2020   PLT 336 02/06/2021   Lab Results  Component Value Date   FERRITIN 88 02/06/2021   Attestation Statements:   Reviewed by clinician on day of visit: allergies, medications, problem list, medical history, surgical history, family history, social history, and previous encounter notes.   I, Burt Knack, am acting as transcriptionist for Quillian Quince, MD.  I have reviewed the above documentation for accuracy and completeness, and I agree with the above. -  Quillian Quince, MD

## 2022-05-09 ENCOUNTER — Other Ambulatory Visit: Payer: Self-pay | Admitting: Medical

## 2022-05-11 ENCOUNTER — Encounter (INDEPENDENT_AMBULATORY_CARE_PROVIDER_SITE_OTHER): Payer: Self-pay | Admitting: Family Medicine

## 2022-05-14 NOTE — Telephone Encounter (Signed)
Prescription was not received by pharmacy, Rx called in today for Phentermine #30 and 0 RF.

## 2022-05-14 NOTE — Telephone Encounter (Signed)
Okey Regal,  Please call the pharmacy and verify that they did not receive the prescription as Epic shows it was sent. If they confirm they did not receive it, please send in a verbal rx for 1 month and then place a note in the chart.

## 2022-05-14 NOTE — Telephone Encounter (Signed)
Medication never received by the pharmacy, could you please re-send to pharmacy?  Thank you

## 2022-05-16 ENCOUNTER — Encounter: Payer: Self-pay | Admitting: Family Medicine

## 2022-05-23 ENCOUNTER — Encounter (INDEPENDENT_AMBULATORY_CARE_PROVIDER_SITE_OTHER): Payer: Self-pay | Admitting: Family Medicine

## 2022-05-23 ENCOUNTER — Ambulatory Visit (INDEPENDENT_AMBULATORY_CARE_PROVIDER_SITE_OTHER): Payer: BC Managed Care – PPO | Admitting: Family Medicine

## 2022-05-28 ENCOUNTER — Ambulatory Visit: Payer: BC Managed Care – PPO | Admitting: Family Medicine

## 2022-05-28 ENCOUNTER — Ambulatory Visit: Payer: Self-pay | Admitting: Family Medicine

## 2022-05-28 ENCOUNTER — Ambulatory Visit: Payer: Self-pay

## 2022-05-28 DIAGNOSIS — M25561 Pain in right knee: Secondary | ICD-10-CM

## 2022-05-28 DIAGNOSIS — M170 Bilateral primary osteoarthritis of knee: Secondary | ICD-10-CM

## 2022-05-28 DIAGNOSIS — M79671 Pain in right foot: Secondary | ICD-10-CM

## 2022-05-28 DIAGNOSIS — M7661 Achilles tendinitis, right leg: Secondary | ICD-10-CM

## 2022-05-28 DIAGNOSIS — G8929 Other chronic pain: Secondary | ICD-10-CM | POA: Diagnosis not present

## 2022-05-28 DIAGNOSIS — M25562 Pain in left knee: Secondary | ICD-10-CM

## 2022-05-28 MED ORDER — TRIAMCINOLONE ACETONIDE 32 MG IX SRER
64.0000 mg | Freq: Once | INTRA_ARTICULAR | Status: AC
  Administered 2022-05-28: 64 mg via INTRA_ARTICULAR

## 2022-05-28 NOTE — Progress Notes (Signed)
Laura Knox is a 56 y.o. female who presents to [facility] today for bilateral knee pain and right posterior and plantar ankle/heel pain.  Laura Knox has had bilateral knee pain ongoing for years.  She is done pretty well in the past with intermittent Zilretta injections.  These have lasted almost 3 months recently.  She is also working on losing weight which should help.  She is eligible for her repeat Zilretta injections today.  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  She has been receiving shockwave treatment for this with Dr. Clare Gandy as well as home exercise program.  She had 10 sessions which has helped her Planter fasciitis but she still experiences posterior and medial heel pain/ankle pain.  This is now more significant than her knee pain and interfere with her ability to exercise.   Pertinent review of systems: No fevers or chills  Relevant historical information: Hypertension   Exam:  Heart rate 80 General: Well Developed, well nourished, and in no acute distress.   MSK: Right knee minimal effusion normal motion with crepitation. Left knee minimal effusion normal motion with crepitation.  Right ankle: Mildly erythematous along the medial calcaneus and posterior calcaneus.  Tender palpation in this region.  Decreased ankle motion.    Lab and Radiology Results  Procedure: Real-time Ultrasound Guided Injection of left knee superior lateral patellar space Device: Philips Affiniti 50G Images permanently stored and available for review in PACS Verbal informed consent obtained.  Discussed risks and benefits of procedure. Warned about infection, bleeding, hyperglycemia damage to structures among others. Patient expresses understanding and agreement Time-out conducted.   Noted no overlying erythema, induration, or other signs of local infection.   Skin prepped in a sterile fashion.    Local anesthesia: Topical Ethyl chloride.   With sterile technique and under real time ultrasound guidance: Zilretta 32 mg injected into knee joint. Fluid seen entering the joint capsule.   Completed without difficulty   Advised to call if fevers/chills, erythema, induration, drainage, or persistent bleeding.   Images permanently stored and available for review in the ultrasound unit.  Impression: Technically successful ultrasound guided injection.    Procedure: Real-time Ultrasound Guided Injection of right knee superior lateral patellar space Device: Philips Affiniti 50G Images permanently stored and available for review in PACS Verbal informed consent obtained.  Discussed risks and benefits of procedure. Warned about infection, bleeding, hyperglycemia damage to structures among others. Patient expresses understanding and agreement Time-out conducted.   Noted no overlying erythema, induration, or other signs of local infection.   Skin prepped in a sterile fashion.   Local anesthesia: Topical Ethyl chloride.   With sterile technique and under real time ultrasound guidance: Zilretta 32 mg injected into knee joint. Fluid seen entering the joint capsule.   Completed without difficulty   Advised to call if fevers/chills, erythema, induration, drainage, or persistent bleeding.   Images permanently stored and available for review in the ultrasound unit.  Impression: Technically successful ultrasound guided injection.    Assessment and Plan: 56 y.o. female with bilateral knee pain.  This is an exacerbation of DJD.  Plan for repeat Zilretta injection today.  We can repeat these injections every 3 months.  Continued weight loss will be very helpful.  Getting her BMI under 40 ultimately will be necessary for knee replacement.  As for the continued ankle pain this is thought to be Achilles tendinitis and potential calcaneal  stress injury.  Plan for MRI ankle.  She is failing typical conservative  management.   PDMP not reviewed this encounter. Orders Placed This Encounter  Procedures   [place] LIMITED JOINT SPACE STRUCTURES LOW RIGHT(NO LINKED CHARGES)    Order Specific Question:   Reason for Exam (SYMPTOM  OR DIAGNOSIS REQUIRED)    Answer:   bilateral knee pain    Order Specific Question:   Preferred imaging location?    Answer:   [facility]   MR ANKLE RIGHT WO CONTRAST    Standing Status:   Future    Standing Expiration Date:   05/29/2023    Order Specific Question:   What is the patient's sedation requirement?    Answer:   No Sedation    Order Specific Question:   Does the patient have a pacemaker or implanted devices?    Answer:   No    Order Specific Question:   Preferred imaging location?    Answer:   [facility] (table limit-350lbs)   Meds ordered this encounter  Medications   Triamcinolone Acetonide (ZILRETTA) intra-articular injection 64 mg     Discussed warning signs or symptoms. Please see discharge instructions. Patient expresses understanding.   The above documentation has been reviewed and is accurate and complete Laura Knox, M.D.

## 2022-05-28 NOTE — Patient Instructions (Addendum)
Thank you for coming in today.   You received an injection today. Seek immediate medical attention if the joint becomes red, extremely painful, or is oozing fluid.   You should hear from MRI scheduling within 1 week. If you do not hear please let me know.    Check back after MRI

## 2022-05-28 NOTE — Assessment & Plan Note (Signed)
Completed shockwave therapy  

## 2022-05-28 NOTE — Progress Notes (Signed)
  Laura Knox - 56 y.o. female MRN [id number]  Date of birth: January 06, 1966  SUBJECTIVE:  Including CC & ROS.  No chief complaint on file.   Laura Knox is a 56 y.o. female that is  here for shockwave therapy.    Review of Systems See HPI   HISTORY: Past Medical, Surgical, Social, and Family History Reviewed & Updated per EMR.   Pertinent Historical Findings include:  Past Medical History:  Diagnosis Date   B12 deficiency    Bilateral swelling of feet and ankles    Hypertension    Joint pain    Kidney stones    Knee pain    Osteoarthritis    Other fatigue    Prediabetes    Sleep apnea    Thyroid disease    Vitamin D deficiency     Past Surgical History:  Procedure Laterality Date   COLONOSCOPY WITH PROPOFOL N/A 04/10/2021   Procedure: COLONOSCOPY WITH PROPOFOL;  Surgeon: Lynann Bologna, MD;  Location: [facility];  Service: Endoscopy;  Laterality: N/A;   KIDNEY STONE SURGERY     miniscus repair Left    POLYPECTOMY  04/10/2021   Procedure: POLYPECTOMY;  Surgeon: Lynann Bologna, MD;  Location: [facility];  Service: Endoscopy;;   SEPTOPLASTY       PHYSICAL EXAM:  VS: Ht 5\' 7"  (1.702 m)   Wt (!) 318 lb (144.2 kg)   BMI 49.81 kg/m  Physical Exam Gen: NAD, alert, cooperative with exam, well-appearing MSK:  Neurovascularly intact    ECSWT Note Laura Knox [birth year]  Procedure: ECSWT Indications: right heel pain  Procedure Details Consent: Risks of procedure as well as the alternatives and risks of each were explained to the (patient/caregiver).  Consent for procedure obtained. Time Out: Verified patient identification, verified procedure, site/side was marked, verified correct patient position, special equipment/implants available, medications/allergies/relevent history reviewed, required imaging and test results available.  Performed.  The area was cleaned with iodine and alcohol swabs.    The right achilles was targeted for Extracorporeal shockwave therapy.    Preset: achillodynia Power Level: 10 Frequency: 7 Impulse/cycles: 2500 Head size: medium  Session: 10  Patient did tolerate procedure well.    ASSESSMENT & PLAN:   Achilles tendinitis Completed shockwave therapy

## 2022-05-30 ENCOUNTER — Other Ambulatory Visit: Payer: Self-pay | Admitting: Gastroenterology

## 2022-06-05 ENCOUNTER — Encounter (INDEPENDENT_AMBULATORY_CARE_PROVIDER_SITE_OTHER): Payer: Self-pay

## 2022-06-08 ENCOUNTER — Ambulatory Visit (INDEPENDENT_AMBULATORY_CARE_PROVIDER_SITE_OTHER): Payer: BC Managed Care – PPO

## 2022-06-08 DIAGNOSIS — M439 Deforming dorsopathy, unspecified: Secondary | ICD-10-CM | POA: Diagnosis not present

## 2022-06-08 DIAGNOSIS — M25571 Pain in right ankle and joints of right foot: Secondary | ICD-10-CM

## 2022-06-08 DIAGNOSIS — M79671 Pain in right foot: Secondary | ICD-10-CM

## 2022-06-08 DIAGNOSIS — M19071 Primary osteoarthritis, right ankle and foot: Secondary | ICD-10-CM | POA: Diagnosis not present

## 2022-06-08 DIAGNOSIS — S86011A Strain of right Achilles tendon, initial encounter: Secondary | ICD-10-CM | POA: Diagnosis not present

## 2022-06-11 NOTE — Progress Notes (Signed)
Right ankle MRI shows medium tendinitis of the Achilles tendon and a small partial-thickness tear and some bone marrow edema.  This is the main source of your pain. You do have some ankle impingement and arthritis changes in the ankle but this is less dominant as the source of your pain. Recommend return to clinic to talk about the results and treatment plan and options.  We may consider immobilization for a little while.

## 2022-06-13 ENCOUNTER — Encounter (INDEPENDENT_AMBULATORY_CARE_PROVIDER_SITE_OTHER): Payer: Self-pay | Admitting: Family Medicine

## 2022-06-19 ENCOUNTER — Ambulatory Visit (INDEPENDENT_AMBULATORY_CARE_PROVIDER_SITE_OTHER): Payer: BC Managed Care – PPO | Admitting: Family Medicine

## 2022-06-19 ENCOUNTER — Encounter: Payer: Self-pay | Admitting: Medical

## 2022-06-19 ENCOUNTER — Encounter (INDEPENDENT_AMBULATORY_CARE_PROVIDER_SITE_OTHER): Payer: Self-pay | Admitting: Family Medicine

## 2022-06-19 VITALS — BP 110/70 | HR 68 | Ht 67.0 in | Wt 309.0 lb

## 2022-06-19 VITALS — BP 110/70 | HR 68 | Temp 98.3°F | Ht 67.0 in | Wt 309.0 lb

## 2022-06-19 DIAGNOSIS — M7661 Achilles tendinitis, right leg: Secondary | ICD-10-CM

## 2022-06-19 DIAGNOSIS — Z6841 Body Mass Index (BMI) 40.0 and over, adult: Secondary | ICD-10-CM | POA: Diagnosis not present

## 2022-06-19 DIAGNOSIS — I10 Essential (primary) hypertension: Secondary | ICD-10-CM

## 2022-06-19 DIAGNOSIS — E669 Obesity, unspecified: Secondary | ICD-10-CM | POA: Diagnosis not present

## 2022-06-19 MED ORDER — LOSARTAN POTASSIUM 100 MG PO TABS
ORAL_TABLET | ORAL | 1 refills | Status: DC
Start: 2022-06-19 — End: 2022-06-19

## 2022-06-19 MED ORDER — LOMAIRA 8 MG PO TABS: 8.0000 mg | ORAL_TABLET | Freq: Every morning | ORAL | 0 refills | Status: DC

## 2022-06-19 MED ORDER — LOSARTAN POTASSIUM 100 MG PO TABS: ORAL_TABLET | ORAL | 1 refills | Status: DC

## 2022-06-19 NOTE — Progress Notes (Signed)
I, Philbert Riser, LAT, ATC acting as a scribe for [name], MD.  Laura Knox is a 56 y.o. female who presents to [facility] at [facility] today for f/u bilateral knee pain and R posterior and R plantar ankle/heel pain w/ R ankle MRI review. Pt was last seen by Dr. [name] on 05/28/22 and was given bilat Zilretta injections and was advised to cont working on height loss and a R ankle MRI was ordered.  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  She has been receiving shockwave treatment for this with Dr. Clare Gandy as well as home exercise program.  She had 10 sessions which has helped her Planter fasciitis but she still experiences posterior and medial heel pain/ankle pain. Today, pt reports bilat knees are feeling pretty good since the Zilretta injections. Pt c/o cont'd pain in her R ankle, but notes improvement after switching to wear OOFA sandals.   Dx imaging: 06/08/22 R ankle MRI  12/10/21 R os calcis XR   Pertinent review of systems: No fevers or chills  Relevant historical information: Obesity   Exam:  BP 110/70   Pulse 68   Ht 5\' 7"  (1.702 m)   Wt (!) 309 lb (140.2 kg)   BMI 48.40 kg/m  General: Well Developed, well nourished, and in no acute distress.   MSK: Right heel: Tender palpation posterior calcaneus.  Normal foot and ankle motion.    Lab and Radiology Results  EXAM: MRI OF THE RIGHT ANKLE WITHOUT CONTRAST   TECHNIQUE: Multiplanar, multisequence MR imaging of the ankle was performed. No intravenous contrast was administered.   COMPARISON:  None Available.   FINDINGS: TENDONS   Peroneal: Peroneal longus tendon intact. Peroneal brevis intact.   Posteromedial: Posterior tibial tendon intact. Flexor hallucis longus tendon intact. Flexor digitorum longus tendon intact.   Anterior: Tibialis anterior tendon intact. Extensor hallucis longus tendon intact Extensor digitorum longus tendon intact.   Achilles: Moderate  tendinosis of the distal Achilles tendon with a small partial-thickness tear and subcortical reactive marrow edema. 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   Plantar Fascia: Intact. Plantar calcaneal spur.   LIGAMENTS   Lateral: Anterior talofibular ligament intact. Calcaneofibular ligament intact. Posterior talofibular ligament intact. Anterior and posterior tibiofibular ligaments intact.   Medial: Deltoid ligament intact. Spring ligament intact.   CARTILAGE   Ankle Joint: No joint effusion. Normal ankle mortise. No chondral defect.   Subtalar Joints/Sinus Tarsi: Normal subtalar joints. No subtalar joint effusion. Normal sinus tarsi.   Bones: No aggressive osseous lesion. No fracture or dislocation. 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 Prominent stieda process with mild reactive marrow changes as can be seen with impingement. Mild osteoarthritis of the navicular-cuboid articulation.   Soft Tissue: No fluid collection or hematoma. Muscles are normal without edema or atrophy. Tarsal tunnel is normal.   IMPRESSION: 1. Moderate tendinosis of the distal Achilles tendon with a small partial-thickness tear and subcortical reactive marrow edema. 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 2. 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 3. Prominent stieda process with mild reactive marrow changes as can be seen with impingement. 4. Mild osteoarthritis of the navicular-cuboid articulation.     Electronically Signed   By: M.D.   On: 06/10/2022 12:25 I, 06/12/2022, personally (independently) visualized and performed the interpretation of the images attached in this note.      Assessment and Plan: 56 y.o. female with right calcaneus pain due to  Achilles tendinitis with tiny partial tear.  Additionally a fair amount of her pain I do believe is due to the periosteal reaction at the Achilles insertion.  I  think a lot of her pain and discomfort from her shoes was due to the heel cup impinging on the painful heel and the benefit that she is experiencing be wearing super soft and comfortable flip-flops as not having pressure on the posterior heel.  The extra cushioning flip-flops are allowing her Planter fasciitis do not return as well.  Her backup plan will be a cam walker boot but since she is improving I do not think we need to use that right now.  Continue home exercise program and comfortable shoes.  We did some strategizing for the upcoming cold months.  Obviously she is getting need some sort of closed toed thing but comfortable shoes that have an open heel will probably be a good choice for her.  Recheck as needed.   Total encounter time 20 minutes including face-to-face time with the patient and, reviewing past medical record, and charting on the date of service.    Discussed warning signs or symptoms. Please see discharge instructions. Patient expresses understanding.  The above documentation has been reviewed and is accurate and complete [name], M.D.

## 2022-06-19 NOTE — Patient Instructions (Addendum)
Thank you for coming in today.   Recheck as needed   Keep up the exercises.

## 2022-06-20 ENCOUNTER — Ambulatory Visit (INDEPENDENT_AMBULATORY_CARE_PROVIDER_SITE_OTHER): Payer: BC Managed Care – PPO | Admitting: Family Medicine

## 2022-06-24 NOTE — Telephone Encounter (Signed)
[facility] states [facility]'t [facility] and would like to see if we can get it sent in again.

## 2022-06-27 NOTE — Progress Notes (Signed)
Chief Complaint:   OBESITY Laura Knox is here to discuss her progress with her obesity treatment plan along with follow-up of her obesity related diagnoses. Laura Knox is on following a lower carbohydrate, vegetable and lean protein rich diet plan and states she is following her eating plan approximately 60% of the time. Laura Knox states she is doing Pharmacist, hospital for 60 minutes 6 times per week.  Today's visit was #: 27 Starting weight: 378 lbs Starting date: 12/27/2020 Today's weight: 309 lbs Today's date: 06/19/2022 Total lbs lost to date: 69 Total lbs lost since last in-office visit: 9  Interim History: Laura Knox continues to do very well with weight loss. She has increased her exercise in the water. Her knee is feeling better with steroid injections. She is still trying to follow her low carbohydrate plan.   Subjective:   1. Essential hypertension Laura Knox's blood pressure is stable on her medications, with no side effects noted. She continue to work on her weight loss.   Assessment/Plan:   1. Essential hypertension Laura Knox will continue losartan 100 mg qhs #30, and we will refill for 1 month.  2. Obesity, Current BMI 48.4 Laura Knox is currently in the action stage of change. As such, her goal is to continue with weight loss efforts. She has agreed to following a lower carbohydrate, vegetable and lean protein rich diet plan.   We discussed various medication options to help Laura Knox with her weight loss efforts and we both agreed to continue phentermine 8 mg once daily, and we will refill for 1 month.  - Phentermine HCl (LOMAIRA) 8 MG TABS; Take 8 mg by mouth every morning. Before 1st meal of the day.  Dispense: 30 tablet; Refill: 0  Exercise goals: As is.   Behavioral modification strategies: increasing lean protein intake and increasing water intake.  Laura Knox has agreed to follow-up with our clinic in 3 to 4 weeks. She was informed of the importance of frequent follow-up visits to  maximize her success with intensive lifestyle modifications for her multiple health conditions.   Objective:   Blood pressure 110/70, pulse 68, temperature 98.3 F (36.8 C), height 5\' 7"  (1.702 m), weight (!) 309 lb (140.2 kg), SpO2 98 %. Body mass index is 48.4 kg/m.  General: Cooperative, alert, well developed, in no acute distress. HEENT: Conjunctivae and lids unremarkable. Cardiovascular: Regular rhythm.  Lungs: Normal work of breathing. Neurologic: No focal deficits.   Lab Results  Component Value Date   CREATININE 0.71 02/06/2022   BUN 20 02/06/2022   NA 141 02/06/2022   K 4.1 02/06/2022   CL 101 02/06/2022   CO2 26 02/06/2022   Lab Results  Component Value Date   ALT 11 02/06/2022   AST 14 02/06/2022   ALKPHOS 102 02/06/2022   BILITOT 0.3 02/06/2022   Lab Results  Component Value Date   HGBA1C 5.6 02/06/2022   HGBA1C 5.9 (H) 06/18/2021   HGBA1C 5.9 02/06/2021   HGBA1C 6.0 (H) 12/27/2020   Lab Results  Component Value Date   INSULIN 16.7 06/18/2021   INSULIN 20.4 12/27/2020   Lab Results  Component Value Date   TSH 3.41 02/06/2021   Lab Results  Component Value Date   CHOL 141 12/27/2020   HDL 44 12/27/2020   LDLCALC 81 12/27/2020   TRIG 80 12/27/2020   Lab Results  Component Value Date   VD25OH 46.7 02/06/2022   VD25OH 76.3 06/18/2021   VD25OH 53.2 02/06/2021   Lab Results  Component Value Date  WBC 8.4 02/06/2021   HGB 13.8 02/06/2021   HCT 42 02/06/2021   MCV 87 12/27/2020   PLT 336 02/06/2021   Lab Results  Component Value Date   FERRITIN 88 02/06/2021   Attestation Statements:   Reviewed by clinician on day of visit: allergies, medications, problem list, medical history, surgical history, family history, social history, and previous encounter notes.  Time spent on visit including pre-visit chart review and post-visit care and charting was 32 minutes.   I, Burt Knack, am acting as transcriptionist for Quillian Quince, MD.  I  have reviewed the above documentation for accuracy and completeness, and I agree with the above. -  Quillian Quince, MD

## 2022-06-29 ENCOUNTER — Other Ambulatory Visit (INDEPENDENT_AMBULATORY_CARE_PROVIDER_SITE_OTHER): Payer: Self-pay | Admitting: Family Medicine

## 2022-06-29 DIAGNOSIS — M170 Bilateral primary osteoarthritis of knee: Secondary | ICD-10-CM

## 2022-07-03 ENCOUNTER — Encounter (INDEPENDENT_AMBULATORY_CARE_PROVIDER_SITE_OTHER): Payer: Self-pay | Admitting: Family Medicine

## 2022-07-03 DIAGNOSIS — M170 Bilateral primary osteoarthritis of knee: Secondary | ICD-10-CM

## 2022-07-08 MED ORDER — MELOXICAM 7.5 MG PO TABS: 7.5000 mg | ORAL_TABLET | Freq: Every day | ORAL | 0 refills | Status: DC

## 2022-07-08 NOTE — Telephone Encounter (Signed)
LAST APPOINTMENT DATE: 06/19/2022 NEXT APPOINTMENT DATE: 07/22/2022   [id number] [place],  - [address] [place] [address] Phone: [phone] Fax: [phone]  [facility] - [place], [place] - [address] [place] [address] Phone: [phone] Fax: [phone]  Patient is requesting a refill of the following medications: Requested Prescriptions    No prescriptions requested or ordered in this encounter    Date last filled: 04/11/2022 Previously prescribed by [facility]  Lab Results  Component Value Date   HGBA1C 5.6 02/06/2022   HGBA1C 5.9 (H) 06/18/2021   HGBA1C 5.9 02/06/2021   Lab Results  Component Value Date   LDLCALC 81 12/27/2020   CREATININE 0.71 02/06/2022   Lab Results  Component Value Date   VD25OH 46.7 02/06/2022   VD25OH 76.3 06/18/2021   VD25OH 53.2 02/06/2021    BP Readings from Last 3 Encounters:  06/19/22 110/70  06/19/22 110/70  05/02/22 136/82

## 2022-07-08 NOTE — Telephone Encounter (Signed)
Ok to rf x 1

## 2022-07-14 ENCOUNTER — Other Ambulatory Visit: Payer: Self-pay | Admitting: Medical

## 2022-07-22 ENCOUNTER — Encounter (INDEPENDENT_AMBULATORY_CARE_PROVIDER_SITE_OTHER): Payer: Self-pay | Admitting: Family Medicine

## 2022-07-22 ENCOUNTER — Ambulatory Visit (INDEPENDENT_AMBULATORY_CARE_PROVIDER_SITE_OTHER): Payer: BC Managed Care – PPO | Admitting: Family Medicine

## 2022-07-22 VITALS — BP 145/64 | HR 83 | Temp 98.3°F | Ht 67.0 in | Wt 308.0 lb

## 2022-07-22 DIAGNOSIS — I10 Essential (primary) hypertension: Secondary | ICD-10-CM | POA: Diagnosis not present

## 2022-07-22 DIAGNOSIS — M170 Bilateral primary osteoarthritis of knee: Secondary | ICD-10-CM | POA: Insufficient documentation

## 2022-07-22 DIAGNOSIS — E669 Obesity, unspecified: Secondary | ICD-10-CM | POA: Diagnosis not present

## 2022-07-22 DIAGNOSIS — Z6841 Body Mass Index (BMI) 40.0 and over, adult: Secondary | ICD-10-CM | POA: Diagnosis not present

## 2022-07-22 MED ORDER — LOMAIRA 8 MG PO TABS: 8.0000 mg | ORAL_TABLET | Freq: Every morning | ORAL | 0 refills | Status: DC

## 2022-07-22 MED ORDER — MELOXICAM 7.5 MG PO TABS: 7.5000 mg | ORAL_TABLET | Freq: Every day | ORAL | 0 refills | Status: DC

## 2022-07-24 NOTE — Progress Notes (Unsigned)
Chief Complaint:   OBESITY Laura Knox is here to discuss her progress with her obesity treatment plan along with follow-up of her obesity related diagnoses. Laura Knox is on {MWMwtlossportion/plan2:23431} and states she is following her eating plan approximately ***% of the time. Laura Knox states she is *** *** minutes *** times per week.  Today's visit was #: *** Starting weight: *** Starting date: *** Today's weight: *** Today's date: 07/22/2022 Total lbs lost to date: *** Total lbs lost since last in-office visit: ***  Interim History: ***  Subjective:   1. Essential hypertension ***  2. Osteoarthritis of both knees, unspecified osteoarthritis type ***  Assessment/Plan:   1. Essential hypertension ***  2. Osteoarthritis of both knees, unspecified osteoarthritis type *** - meloxicam (MOBIC) 7.5 MG tablet; Take 1 tablet (7.5 mg total) by mouth daily.  Dispense: 30 tablet; Refill: 0  3. Obesity, Current BMI 48.3 Laura Knox {CHL AMB IS/IS NOT:210130109} currently in the action stage of change. As such, her goal is to {MWMwtloss#1:210800005}. She has agreed to {MWMwtlossportion/plan2:23431}.   We discussed various medication options to help Laura Knox with her weight loss efforts and we both agreed to ***.  - Phentermine HCl (LOMAIRA) 8 MG TABS; Take 8 mg by mouth every morning. Before 1st meal of the day.  Dispense: 30 tablet; Refill: 0  Exercise goals: {MWM EXERCISE RECS:23473}  Behavioral modification strategies: {MWMwtlossdietstrategies3:23432}.  Laura Knox has agreed to follow-up with our clinic in {NUMBER 1-10:22536} weeks. She was informed of the importance of frequent follow-up visits to maximize her success with intensive lifestyle modifications for her multiple health conditions.   Objective:   Blood pressure (!) 145/64, pulse 83, temperature 98.3 F (36.8 C), height 5\' 7"  (1.702 m), weight (!) 308 lb (139.7 kg), SpO2 94 %. Body mass index is 48.24 kg/m.  General: Cooperative,  alert, well developed, in no acute distress. HEENT: Conjunctivae and lids unremarkable. Cardiovascular: Regular rhythm.  Lungs: Normal work of breathing. Neurologic: No focal deficits.   Lab Results  Component Value Date   CREATININE 0.71 02/06/2022   BUN 20 02/06/2022   NA 141 02/06/2022   K 4.1 02/06/2022   CL 101 02/06/2022   CO2 26 02/06/2022   Lab Results  Component Value Date   ALT 11 02/06/2022   AST 14 02/06/2022   ALKPHOS 102 02/06/2022   BILITOT 0.3 02/06/2022   Lab Results  Component Value Date   HGBA1C 5.6 02/06/2022   HGBA1C 5.9 (H) 06/18/2021   HGBA1C 5.9 02/06/2021   HGBA1C 6.0 (H) 12/27/2020   Lab Results  Component Value Date   INSULIN 16.7 06/18/2021   INSULIN 20.4 12/27/2020   Lab Results  Component Value Date   TSH 3.41 02/06/2021   Lab Results  Component Value Date   CHOL 141 12/27/2020   HDL 44 12/27/2020   LDLCALC 81 12/27/2020   TRIG 80 12/27/2020   Lab Results  Component Value Date   VD25OH 46.7 02/06/2022   VD25OH 76.3 06/18/2021   VD25OH 53.2 02/06/2021   Lab Results  Component Value Date   WBC 8.4 02/06/2021   HGB 13.8 02/06/2021   HCT 42 02/06/2021   MCV 87 12/27/2020   PLT 336 02/06/2021   Lab Results  Component Value Date   FERRITIN 88 02/06/2021   Attestation Statements:   Reviewed by clinician on day of visit: allergies, medications, problem list, medical history, surgical history, family history, social history, and previous encounter notes.   Wilhemena Durie, am acting as  transcriptionist for Quillian Quince, MD.  I have reviewed the above documentation for accuracy and completeness, and I agree with the above. -  ***

## 2022-08-04 ENCOUNTER — Other Ambulatory Visit: Payer: Self-pay | Admitting: Medical

## 2022-08-10 ENCOUNTER — Encounter: Payer: Self-pay | Admitting: Family Medicine

## 2022-08-12 ENCOUNTER — Encounter (INDEPENDENT_AMBULATORY_CARE_PROVIDER_SITE_OTHER): Payer: Self-pay | Admitting: Family Medicine

## 2022-08-12 ENCOUNTER — Ambulatory Visit (INDEPENDENT_AMBULATORY_CARE_PROVIDER_SITE_OTHER): Payer: BC Managed Care – PPO | Admitting: Family Medicine

## 2022-08-12 VITALS — BP 124/78 | HR 74 | Temp 98.2°F | Ht 67.0 in | Wt 304.0 lb

## 2022-08-12 DIAGNOSIS — I10 Essential (primary) hypertension: Secondary | ICD-10-CM

## 2022-08-12 DIAGNOSIS — M170 Bilateral primary osteoarthritis of knee: Secondary | ICD-10-CM

## 2022-08-12 DIAGNOSIS — E559 Vitamin D deficiency, unspecified: Secondary | ICD-10-CM

## 2022-08-12 DIAGNOSIS — R7303 Prediabetes: Secondary | ICD-10-CM | POA: Diagnosis not present

## 2022-08-12 DIAGNOSIS — E669 Obesity, unspecified: Secondary | ICD-10-CM

## 2022-08-12 DIAGNOSIS — M1732 Unilateral post-traumatic osteoarthritis, left knee: Secondary | ICD-10-CM | POA: Diagnosis not present

## 2022-08-12 DIAGNOSIS — Z6841 Body Mass Index (BMI) 40.0 and over, adult: Secondary | ICD-10-CM

## 2022-08-12 MED ORDER — LOMAIRA 8 MG PO TABS: 8.0000 mg | ORAL_TABLET | Freq: Every morning | ORAL | 0 refills | Status: DC

## 2022-08-12 MED ORDER — MELOXICAM 7.5 MG PO TABS: 7.5000 mg | ORAL_TABLET | Freq: Every day | ORAL | 0 refills | Status: DC

## 2022-08-12 NOTE — Progress Notes (Signed)
Office: [phone]  /  Fax: [phone]       BP 124/78   Pulse 74   Temp 98.2 F (36.8 C)   Ht 5\' 7"  (1.702 m)   Wt (!) 304 lb (137.9 kg)   SpO2 99%   BMI 47.61 kg/m  She was weighed on the bioimpedance scale:  Body mass index is 47.61 kg/m.  General:  Alert, oriented and cooperative. Patient is in no acute distress.  Mental Status: Normal mood and affect. Normal behavior. Normal judgment and thought content.        Patient past medical history includes:   Past Medical History:  Diagnosis Date   B12 deficiency    Bilateral swelling of feet and ankles    Hypertension    Joint pain    Kidney stones    Knee pain    Osteoarthritis    Other fatigue    Prediabetes    Sleep apnea    Thyroid disease    Vitamin D deficiency    History of Present Illness The patient presents with a history of obesity, hypertension, and osteoarthritis in the knees. They have successfully lost over 100 pounds, with their highest recorded weight being 407 pounds. The patient has been following a low-carb diet and aims to consume around 2000 calories per day, with at least 100 grams of protein. They have not experienced any significant issues with blood pressure, despite their weight history.  The patient has been taking Mobic for their knee pain, which they describe as mildly effective. They report that their left knee has been swollen and hot to touch, with a significantly reduced range of motion compared to a few days prior. The patient has a history of receiving injections for their knee pain and is scheduled for another round in the near future. They have noticed that the effectiveness of the injections varies, possibly due to the accuracy of the injection site.  In addition to their knee pain, the patient experiences foot pain, which they describe as sharp and stabbing. This pain is different from the stiffness, pressure, and achiness they typically feel in their knees. The patient has  tried ice therapy for their knee pain, but found it to be too painful after 15 minutes.  The patient has been on metformin and phentermine, and has experienced some days where they felt dizzy and had a low-grade headache, which they believe may be related to blood pressure. They have been hovering on the borderline of having an elevated A1c, with their last reading being 5.6. The patient maintains a B12-rich diet and is being monitored for potential B12 depletion due to metformin use.  Assessment & Plan Obesity: Significant weight loss of over 100 pounds achieved through low carbohydrate diet and calorie restriction. -Encourage continuation of current dietary regimen, aiming for around 2000 calories per day with at least 100 grams of protein.  Knee pain: Aggravated by overexertion, partially relieved by Mobic. Swelling and heat in left knee suggestive of possible effusion. -Continue Mobic as prescribed. -Advise patient to follow up with orthopedic specialist for possible knee joint aspiration and further management. -Encourage gentle exercise such as swimming to maintain mobility without exacerbating knee pain.  Prediabetes: Patient on Metformin, last HbA1c was 5.6. -Continue Metformin as prescribed. -Order labs including HbA1c, fasting insulin, B12 (due to Metformin use), and TSH.  Hypertension: Patient reports occasional dizziness and low-grade headaches, suggestive of possible blood pressure fluctuations. -Order labs including renal function tests and electrolytes. -Advise patient to  monitor blood pressure at home and report any consistent elevations.  Follow-up in 3 weeks.  I have personally spent 40 minutes total time today in preparation, patient care, and documentation for this visit, including the following: review of clinical lab tests; review of medical tests/procedures/services.  Dennard Nip, MD

## 2022-08-13 LAB — CMP14+EGFR
ALT: 11 IU/L (ref 0–32)
AST: 11 IU/L (ref 0–40)
Albumin/Globulin Ratio: 1.4 (ref 1.2–2.2)
Albumin: 4.1 g/dL (ref 3.8–4.9)
Alkaline Phosphatase: 98 IU/L (ref 44–121)
BUN/Creatinine Ratio: 26 — ABNORMAL HIGH (ref 9–23)
BUN: 17 mg/dL (ref 6–24)
Bilirubin Total: 0.6 mg/dL (ref 0.0–1.2)
CO2: 22 mmol/L (ref 20–29)
Calcium: 9.2 mg/dL (ref 8.7–10.2)
Chloride: 106 mmol/L (ref 96–106)
Creatinine, Ser: 0.65 mg/dL (ref 0.57–1.00)
Globulin, Total: 2.9 g/dL (ref 1.5–4.5)
Glucose: 84 mg/dL (ref 70–99)
Potassium: 3.9 mmol/L (ref 3.5–5.2)
Sodium: 142 mmol/L (ref 134–144)
Total Protein: 7 g/dL (ref 6.0–8.5)
eGFR: 103 mL/min/{1.73_m2} (ref >59)

## 2022-08-13 LAB — TSH: TSH: 0.353 u[IU]/mL — ABNORMAL LOW (ref 0.450–4.500)

## 2022-08-13 LAB — VITAMIN B12: Vitamin B-12: 546 pg/mL (ref 232–1245)

## 2022-08-13 LAB — INSULIN, RANDOM: INSULIN: 7.4 u[IU]/mL (ref 2.6–24.9)

## 2022-08-13 LAB — HEMOGLOBIN A1C
Est. average glucose Bld gHb Est-mCnc: 120 mg/dL
Hgb A1c MFr Bld: 5.8 % — ABNORMAL HIGH (ref 4.8–5.6)

## 2022-08-13 LAB — VITAMIN D 25 HYDROXY (VIT D DEFICIENCY, FRACTURES): Vit D, 25-Hydroxy: 41.3 ng/mL (ref 30.0–100.0)

## 2022-08-14 ENCOUNTER — Ambulatory Visit: Payer: BC Managed Care – PPO | Admitting: Family Medicine

## 2022-08-14 ENCOUNTER — Ambulatory Visit: Payer: Self-pay

## 2022-08-14 VITALS — BP 128/86 | HR 86 | Ht 67.0 in | Wt 310.0 lb

## 2022-08-14 DIAGNOSIS — G8929 Other chronic pain: Secondary | ICD-10-CM | POA: Diagnosis not present

## 2022-08-14 DIAGNOSIS — Z6841 Body Mass Index (BMI) 40.0 and over, adult: Secondary | ICD-10-CM

## 2022-08-14 DIAGNOSIS — M170 Bilateral primary osteoarthritis of knee: Secondary | ICD-10-CM | POA: Diagnosis not present

## 2022-08-14 DIAGNOSIS — M25562 Pain in left knee: Secondary | ICD-10-CM

## 2022-08-14 DIAGNOSIS — M25561 Pain in right knee: Secondary | ICD-10-CM

## 2022-08-14 MED ORDER — TRIAMCINOLONE ACETONIDE 32 MG IX SRER
64.0000 mg | Freq: Once | INTRA_ARTICULAR | Status: AC
  Administered 2022-08-14: 64 mg via INTRA_ARTICULAR

## 2022-08-14 NOTE — Patient Instructions (Addendum)
Thank you for coming in today.   Recheck as needed.   You should hear from general surgery soon about bariatriac surgery evaluation.

## 2022-08-14 NOTE — Progress Notes (Unsigned)
I, Peterson Lombard, LAT, ATC acting as a scribe for Lynne Leader, MD.  Laura Knox is a 56 y.o. female who presents to [facility] today for worsening L knee pain. Pt was last seen by Dr. Georgina Snell on 06/19/22 for R Achilles tendonopathy.  Had bilateral Zilretta injections on 05/28/2022.  Patient sent a MyChart message on 1014, complaining of increased pain and swelling in her left knee, and was advised to make a follow-up visit sooner.  Today, patient reports L knee pain worsened on Friday after walking around in a field for 2 hours, L>R. Pt note decreased AROM and swelling present in L knee.    Pertinent review of systems: No fevers or chills  Relevant historical information: Hypertension and obesity.  She is lost over 100 pounds of body weight with medical supervised weight loss to achieve current BMI of about 48.   Exam:  BP 128/86   Pulse 86   Ht 5\' 7"  (1.702 m)   Wt (!) 310 lb (140.6 kg)   SpO2 99%   BMI 48.55 kg/m  General: Well Developed, well nourished, and in no acute distress.   MSK: Knees bilaterally moderate effusion normal motion with crepitation.    Lab and Radiology Results  Procedure: Real-time Ultrasound Guided Injection of the right knee superior lateral patellar space Device: Philips Affiniti 50G Images permanently stored and available for review in PACS Verbal informed consent obtained.  Discussed risks and benefits of procedure. Warned about infection, bleeding, hyperglycemia damage to structures among others. Patient expresses understanding and agreement Time-out conducted.   Noted no overlying erythema, induration, or other signs of local infection.   Skin prepped in a sterile fashion.   Local anesthesia: Topical Ethyl chloride.   With sterile technique and under real time ultrasound guidance: Zilretta 32 mg injected into the knee joint. Fluid seen entering the joint capsule.   Completed without difficulty   Spinal needle  used Advised to call if fevers/chills, erythema, induration, drainage, or persistent bleeding.   Images permanently stored and available for review in the ultrasound unit.  Impression: Technically successful ultrasound guided injection.   Procedure: Real-time Ultrasound Guided Injection of left knee superior lateral patellar space Device: Philips Affiniti 50G Images permanently stored and available for review in PACS Verbal informed consent obtained.  Discussed risks and benefits of procedure. Warned about infection, bleeding, hyperglycemia damage to structures among others. Patient expresses understanding and agreement Time-out conducted.   Noted no overlying erythema, induration, or other signs of local infection.   Skin prepped in a sterile fashion.   Local anesthesia: Topical Ethyl chloride.   With sterile technique and under real time ultrasound guidance: Zilretta 32 mg injected into knee joint. Fluid seen entering the joint capsule.   Completed without difficulty   Spinal needle used Advised to call if fevers/chills, erythema, induration, drainage, or persistent bleeding.   Images permanently stored and available for review in the ultrasound unit.  Impression: Technically successful ultrasound guided injection.          Assessment and Plan: 56 y.o. female with bilateral knee pain due to exacerbation of DJD.  Plan for repeat Zilretta injection.  She is a little early today but we will go ahead and proceed with repeat injection. She successfully lost quite a bit of body weight with supervised weight loss however her BMI is still 48.  Her BMI needs to be under 40 in order to have a knee replacement when she really needs.  This would be about a 50 pound weight loss.  I am worried that she is not going to get her weight loss fast enough before her knees wear out as she is going to be in real trouble.  She talk to me about this acceleration of her knee pain.  I think if we have enough  time she probably would get her body weight on her BMI of 40 but I think time is running out.  May be worth her while to have a conversation with bariatric surgery about her surgical options while she is continuing to pursue medical supervised weight management.   PDMP not reviewed this encounter. Orders Placed This Encounter  Procedures   [place] LIMITED JOINT SPACE STRUCTURES LOW BILAT(NO LINKED CHARGES)    Order Specific Question:   Reason for Exam (SYMPTOM  OR DIAGNOSIS REQUIRED)    Answer:   bilateral knee pain    Order Specific Question:   Preferred imaging location?    Answer:   [facility] referral to General Surgery    Referral Priority:   Routine    Referral Type:   Surgical    Referral Reason:   Specialty Services Required    Requested Specialty:   General Surgery    Number of Visits Requested:   1   Meds ordered this encounter  Medications   Triamcinolone Acetonide (ZILRETTA) intra-articular injection 64 mg     Discussed warning signs or symptoms. Please see discharge instructions. Patient expresses understanding.   The above documentation has been reviewed and is accurate and complete [name], M.D.

## 2022-08-28 ENCOUNTER — Ambulatory Visit: Payer: BC Managed Care – PPO | Admitting: Family Medicine

## 2022-09-02 ENCOUNTER — Encounter (INDEPENDENT_AMBULATORY_CARE_PROVIDER_SITE_OTHER): Payer: Self-pay | Admitting: Family Medicine

## 2022-09-02 ENCOUNTER — Other Ambulatory Visit: Payer: Self-pay | Admitting: Medical

## 2022-09-02 ENCOUNTER — Ambulatory Visit (INDEPENDENT_AMBULATORY_CARE_PROVIDER_SITE_OTHER): Payer: BC Managed Care – PPO | Admitting: Family Medicine

## 2022-09-02 VITALS — BP 140/82 | HR 80 | Temp 98.9°F | Ht 67.0 in | Wt 294.0 lb

## 2022-09-02 DIAGNOSIS — Z6841 Body Mass Index (BMI) 40.0 and over, adult: Secondary | ICD-10-CM

## 2022-09-02 DIAGNOSIS — I10 Essential (primary) hypertension: Secondary | ICD-10-CM | POA: Diagnosis not present

## 2022-09-02 DIAGNOSIS — R7303 Prediabetes: Secondary | ICD-10-CM

## 2022-09-02 DIAGNOSIS — E669 Obesity, unspecified: Secondary | ICD-10-CM

## 2022-09-02 DIAGNOSIS — M170 Bilateral primary osteoarthritis of knee: Secondary | ICD-10-CM | POA: Diagnosis not present

## 2022-09-02 MED ORDER — MELOXICAM 7.5 MG PO TABS: 7.5000 mg | ORAL_TABLET | Freq: Every day | ORAL | 0 refills | Status: DC

## 2022-09-02 MED ORDER — LOMAIRA 8 MG PO TABS: 8.0000 mg | ORAL_TABLET | Freq: Every morning | ORAL | 0 refills | Status: DC

## 2022-09-10 NOTE — Progress Notes (Signed)
Chief Complaint:   OBESITY Laura Knox is here to discuss her progress with her obesity treatment plan along with follow-up of her obesity related diagnoses. Laura Knox is on keeping a food journal and adhering to recommended goals of 2000 calories and 100 grams of protein daily and states she is following her eating plan approximately 95% of the time. Laura Knox states she is water aerobics for 60 minutes 6 times per week.  Today's visit was #: 30 Starting weight: 378 lbs Starting date: 12/27/2020 Today's weight: 294 lbs Today's date: 09/02/2022 Total lbs lost to date: 84 Total lbs lost since last in-office visit: 10  Interim History: Laura Knox has done very well with her weight loss.  She has been very diligent with the low carbohydrate, and she feels her hunger is controlled and now her cravings are much improved.  Subjective:   1. Pre-diabetes Laura Knox is taking metformin with no side effects noted.  2. Osteoarthritis of both knees, unspecified osteoarthritis type Laura Knox is followed by Laura. Laura Knox.  She is status post injections recently and she feels it is helping this time.  She is taking meloxicam with no side effects noted.  3. Essential hypertension Laura Knox's blood pressure is slightly elevated today.  She feels some may be pain related.  She is taking losartan and hydrochlorothiazide with no side effects noted.  Assessment/Plan:   1. Pre-diabetes Laura Knox will continue metformin, diet, and exercise.  2. Osteoarthritis of both knees, unspecified osteoarthritis type We will refill meloxicam for 1 month.  Laura Knox will continue to work on her weight loss, healthy eating plan, and exercise.  - meloxicam (MOBIC) 7.5 MG tablet; Take 1 tablet (7.5 mg total) by mouth daily.  Dispense: 30 tablet; Refill: 0  3. Essential hypertension Laura Knox will continue her medications, diet, and exercise.  We will follow-up on her blood pressure at her next visit.  4. Obesity, Current BMI 46.1 Laura Knox is currently in the  action stage of change. As such, her goal is to continue with weight loss efforts. She has agreed to following a lower carbohydrate, vegetable and lean protein rich diet plan.   Laura Knox will continue Phentermine 8 mg once daily, and we will refill for 1 month. We discussed the need to make sure she is hydrating well.   - Phentermine HCl (LOMAIRA) 8 MG TABS; Take 8 mg by mouth every morning. Before 1st meal of the day.  Dispense: 30 tablet; Refill: 0  Exercise goals: As is.   Behavioral modification strategies: increasing lean protein intake, decreasing simple carbohydrates, increasing water intake, meal planning and cooking strategies, holiday eating strategies , avoiding temptations, and planning for success.  Laura Knox has agreed to follow-up with our clinic in 3 weeks. She was informed of the importance of frequent follow-up visits to maximize her success with intensive lifestyle modifications for her multiple health conditions.   Objective:   Blood pressure (!) 140/82, pulse 80, temperature 98.9 F (37.2 C), height 5\' 7"  (1.702 m), weight 294 lb (133.4 kg), SpO2 98 %. Body mass index is 46.05 kg/m.  General: Cooperative, alert, well developed, in no acute distress. HEENT: Conjunctivae and lids unremarkable. Cardiovascular: Regular rhythm.  Lungs: Normal work of breathing. Neurologic: No focal deficits.   Lab Results  Component Value Date   CREATININE 0.65 08/12/2022   BUN 17 08/12/2022   NA 142 08/12/2022   K 3.9 08/12/2022   CL 106 08/12/2022   CO2 22 08/12/2022   Lab Results  Component Value Date  ALT 11 08/12/2022   AST 11 08/12/2022   ALKPHOS 98 08/12/2022   BILITOT 0.6 08/12/2022   Lab Results  Component Value Date   HGBA1C 5.8 (H) 08/12/2022   HGBA1C 5.6 02/06/2022   HGBA1C 5.9 (H) 06/18/2021   HGBA1C 5.9 02/06/2021   HGBA1C 6.0 (H) 12/27/2020   Lab Results  Component Value Date   INSULIN 7.4 08/12/2022   INSULIN 16.7 06/18/2021   INSULIN 20.4 12/27/2020    Lab Results  Component Value Date   TSH 0.353 (L) 08/12/2022   Lab Results  Component Value Date   CHOL 141 12/27/2020   HDL 44 12/27/2020   LDLCALC 81 12/27/2020   TRIG 80 12/27/2020   Lab Results  Component Value Date   VD25OH 41.3 08/12/2022   VD25OH 46.7 02/06/2022   VD25OH 76.3 06/18/2021   Lab Results  Component Value Date   WBC 8.4 02/06/2021   HGB 13.8 02/06/2021   HCT 42 02/06/2021   MCV 87 12/27/2020   PLT 336 02/06/2021   Lab Results  Component Value Date   FERRITIN 88 02/06/2021   Attestation Statements:   Reviewed by clinician on day of visit: allergies, medications, problem list, medical history, surgical history, family history, social history, and previous encounter notes.  I have personally spent 40 minutes total time today in preparation, patient care, and documentation for this visit, including the following: review of clinical lab tests; review of medical tests/procedures/services.   I, Burt Knack, am acting as transcriptionist for Quillian Quince, MD.  I have reviewed the above documentation for accuracy and completeness, and I agree with the above. -  Quillian Quince, MD

## 2022-09-12 ENCOUNTER — Other Ambulatory Visit (INDEPENDENT_AMBULATORY_CARE_PROVIDER_SITE_OTHER): Payer: Self-pay | Admitting: Family Medicine

## 2022-09-12 DIAGNOSIS — R7303 Prediabetes: Secondary | ICD-10-CM

## 2022-09-23 ENCOUNTER — Ambulatory Visit (INDEPENDENT_AMBULATORY_CARE_PROVIDER_SITE_OTHER): Payer: BC Managed Care – PPO | Admitting: Family Medicine

## 2022-09-23 ENCOUNTER — Encounter (INDEPENDENT_AMBULATORY_CARE_PROVIDER_SITE_OTHER): Payer: Self-pay | Admitting: Family Medicine

## 2022-09-23 VITALS — BP 122/81 | HR 87 | Temp 97.8°F | Ht 67.0 in | Wt 292.0 lb

## 2022-09-23 DIAGNOSIS — I10 Essential (primary) hypertension: Secondary | ICD-10-CM

## 2022-09-23 DIAGNOSIS — E669 Obesity, unspecified: Secondary | ICD-10-CM | POA: Diagnosis not present

## 2022-09-23 DIAGNOSIS — M170 Bilateral primary osteoarthritis of knee: Secondary | ICD-10-CM | POA: Diagnosis not present

## 2022-09-23 DIAGNOSIS — Z6841 Body Mass Index (BMI) 40.0 and over, adult: Secondary | ICD-10-CM

## 2022-09-23 MED ORDER — LOMAIRA 8 MG PO TABS: 8.0000 mg | ORAL_TABLET | Freq: Every morning | ORAL | 0 refills | Status: DC

## 2022-09-23 MED ORDER — MELOXICAM 7.5 MG PO TABS: 7.5000 mg | ORAL_TABLET | Freq: Every day | ORAL | 0 refills | Status: DC

## 2022-09-27 ENCOUNTER — Encounter: Payer: Self-pay | Admitting: Family Medicine

## 2022-10-01 NOTE — Progress Notes (Unsigned)
Chief Complaint:   OBESITY Laura Knox is here to discuss her progress with her obesity treatment plan along with follow-up of her obesity related diagnoses. Laura Knox is on following a lower carbohydrate, vegetable and lean protein rich diet plan and states she is following her eating plan approximately 95% of the time. Laura Knox states she is doing water aerobics for 60+ minutes 7 times per week.  Today's visit was #: 31 Starting weight: 378 lbs Starting date: 12/27/2020 Today's weight: 292 lbs Today's date: 09/23/2022 Total lbs lost to date: 86 Total lbs lost since last in-office visit: 2  Interim History: Laura Knox continues to do well with weight loss even over Thanksgiving. She is very disappointed that is wasn't more weight loss. No side effects of phentermine was noted.   Subjective:   1. Osteoarthritis of both knees, unspecified osteoarthritis type Laura Knox is using Mobic to help with her arthritis. She does have a history.   2. Essential hypertension Laura Knox's blood pressure is stable on her medications. No side effects were noted.   Assessment/Plan:   1. Osteoarthritis of both knees, unspecified osteoarthritis type Laura Knox will continue Mobic 7.5 mg once daily, and we will refill for 1 month. She is to keep taking it with food.   - meloxicam (MOBIC) 7.5 MG tablet; Take 1 tablet (7.5 mg total) by mouth daily.  Dispense: 30 tablet; Refill: 0  2. Essential hypertension Laura Knox will continue her medications, and will continue with her diet, exercise, and weight loss.   3. Obesity, Current BMI 45.8 Laura Knox is currently in the action stage of change. As such, her goal is to continue with weight loss efforts. She has agreed to following a lower carbohydrate, vegetable and lean protein rich diet plan.   Laura Knox will continue phentermine 8 mg daily and we will refill for 1 month.   - Phentermine HCl (LOMAIRA) 8 MG TABS; Take 8 mg by mouth every morning. Before 1st meal of the day.  Dispense: 30  tablet; Refill: 0  Exercise goals: As is.   Behavioral modification strategies: increasing water intake.  Laura Knox has agreed to follow-up with our clinic in 3 to 4 weeks. She was informed of the importance of frequent follow-up visits to maximize her success with intensive lifestyle modifications for her multiple health conditions.   Objective:   Blood pressure 122/81, pulse 87, temperature 97.8 F (36.6 C), height 5\' 7"  (1.702 m), weight 292 lb (132.5 kg), SpO2 99 %. Body mass index is 45.73 kg/m.  General: Cooperative, alert, well developed, in no acute distress. HEENT: Conjunctivae and lids unremarkable. Cardiovascular: Regular rhythm.  Lungs: Normal work of breathing. Neurologic: No focal deficits.   Lab Results  Component Value Date   CREATININE 0.65 08/12/2022   BUN 17 08/12/2022   NA 142 08/12/2022   K 3.9 08/12/2022   CL 106 08/12/2022   CO2 22 08/12/2022   Lab Results  Component Value Date   ALT 11 08/12/2022   AST 11 08/12/2022   ALKPHOS 98 08/12/2022   BILITOT 0.6 08/12/2022   Lab Results  Component Value Date   HGBA1C 5.8 (H) 08/12/2022   HGBA1C 5.6 02/06/2022   HGBA1C 5.9 (H) 06/18/2021   HGBA1C 5.9 02/06/2021   HGBA1C 6.0 (H) 12/27/2020   Lab Results  Component Value Date   INSULIN 7.4 08/12/2022   INSULIN 16.7 06/18/2021   INSULIN 20.4 12/27/2020   Lab Results  Component Value Date   TSH 0.353 (L) 08/12/2022   Lab Results  Component Value Date   CHOL 141 12/27/2020   HDL 44 12/27/2020   LDLCALC 81 12/27/2020   TRIG 80 12/27/2020   Lab Results  Component Value Date   VD25OH 41.3 08/12/2022   VD25OH 46.7 02/06/2022   VD25OH 76.3 06/18/2021   Lab Results  Component Value Date   WBC 8.4 02/06/2021   HGB 13.8 02/06/2021   HCT 42 02/06/2021   MCV 87 12/27/2020   PLT 336 02/06/2021   Lab Results  Component Value Date   FERRITIN 88 02/06/2021   Attestation Statements:   Reviewed by clinician on day of visit: allergies,  medications, problem list, medical history, surgical history, family history, social history, and previous encounter notes.   I, Burt Knack, am acting as transcriptionist for Quillian Quince, MD.  I have reviewed the above documentation for accuracy and completeness, and I agree with the above. -  Quillian Quince, MD

## 2022-10-02 ENCOUNTER — Other Ambulatory Visit: Payer: Self-pay

## 2022-10-02 DIAGNOSIS — M170 Bilateral primary osteoarthritis of knee: Secondary | ICD-10-CM

## 2022-10-04 ENCOUNTER — Ambulatory Visit (INDEPENDENT_AMBULATORY_CARE_PROVIDER_SITE_OTHER): Payer: BC Managed Care – PPO | Admitting: Orthopaedic Surgery

## 2022-10-04 ENCOUNTER — Ambulatory Visit: Payer: Self-pay

## 2022-10-04 ENCOUNTER — Encounter: Payer: Self-pay | Admitting: Orthopaedic Surgery

## 2022-10-04 ENCOUNTER — Ambulatory Visit (INDEPENDENT_AMBULATORY_CARE_PROVIDER_SITE_OTHER): Payer: BC Managed Care – PPO

## 2022-10-04 VITALS — Ht 67.0 in | Wt 297.0 lb

## 2022-10-04 DIAGNOSIS — M170 Bilateral primary osteoarthritis of knee: Secondary | ICD-10-CM | POA: Diagnosis not present

## 2022-10-04 DIAGNOSIS — Z6841 Body Mass Index (BMI) 40.0 and over, adult: Secondary | ICD-10-CM | POA: Diagnosis not present

## 2022-10-04 DIAGNOSIS — M1711 Unilateral primary osteoarthritis, right knee: Secondary | ICD-10-CM

## 2022-10-04 DIAGNOSIS — M1712 Unilateral primary osteoarthritis, left knee: Secondary | ICD-10-CM

## 2022-10-04 NOTE — Progress Notes (Signed)
Office Visit Note   Patient: Laura Knox           Date of Birth: Jun 04, 1966           MRN: [id number] Visit Date: 10/04/2022              Requested by: Gregor Hams, MD [place],  [place] [id number] PCP: Mackie Pai, PA-C   Assessment & Plan: Visit Diagnoses:  1. Bilateral primary osteoarthritis of knee   2. Body mass index 40.0-44.9, adult (HCC)    Plan: Impression is severe left knee degenerative joint disease secondary to Osteoarthritis.  Bone on bone joint space narrowing is seen on radiographs with significant varus alignment.  At this point, conservative treatments fail to provide any significant relief and the pain is severely affecting ADLs and quality of life.  Based on treatment options, the patient has elected to move forward with a knee replacement.  We have discussed the surgical risks that include but are not limited to infection, DVT, leg length discrepancy, stiffness, numbness, tingling, incomplete relief of pain.  Recovery and prognosis were also reviewed.    We also had a long discussion about the impact increased BMI as a predictor of a successful outcome from total joint replacement.  I explained that she is at a slightly higher risk due to the increased BMI however recent research shows that the vast majority of patients with increased BMI are still able to have a successful outcome.  However overall risk is higher due to BMI greater than 40.  She understands this and will make all efforts at weight loss but she is willing to accept the risks if she is not under a BMI of 40 by the time of surgery.  She understands that due to the Zilretta injection on 08/14/2022 we have to wait at least 90 days before we can perform surgery.  Jackelyn Poling will call the patient to confirm surgery date.  Anticoagulants: No antithrombotic Postop anticoagulation: Xarelto Diabetic:  prediabetic, A1c 5.8 Nickel allergy: No Prior DVT/PE: No Tobacco use: No Clearances needed for  surgery: None Anticipated discharge dispo: SNF, patient lives alone   Follow-Up Instructions: No follow-ups on file.   Orders:  Orders Placed This Encounter  Procedures   XR KNEE 3 VIEW RIGHT   XR KNEE 3 VIEW LEFT   No orders of the defined types were placed in this encounter.     Procedures: No procedures performed   Clinical Data: No additional findings.   Subjective: Chief Complaint  Patient presents with   Right Knee - Pain   Left Knee - Pain    HPI Laura Knox is a very pleasant 56 year old female referral from Dr. Georgina Snell for severe bilateral knee pain worse in the left knee.  She has had severe pain with worsening over the last 10 years.  Has had prior left knee scope.  She has tried standard cortisone injections as well as Zilretta and viscosupplementation.  Unfortunately these only provide temporary relief never more than a couple months.  She has lost over 120 pounds.  The pain is severely interfering with quality of life and ADLs.  Wakes her up at night.  Review of Systems  Constitutional: Negative.   HENT: Negative.    Eyes: Negative.   Respiratory: Negative.    Cardiovascular: Negative.   Endocrine: Negative.   Musculoskeletal: Negative.   Neurological: Negative.   Hematological: Negative.   Psychiatric/Behavioral: Negative.    All other systems reviewed and  are negative.    Objective: Vital Signs: Ht 5\' 7"  (1.702 m)   Wt 297 lb (134.7 kg)   BMI 46.52 kg/m   Physical Exam Vitals and nursing note reviewed.  Constitutional:      Appearance: She is well-developed.  HENT:     Head: Atraumatic.     Nose: Nose normal.  Eyes:     Extraocular Movements: Extraocular movements intact.  Cardiovascular:     Pulses: Normal pulses.  Pulmonary:     Effort: Pulmonary effort is normal.  Abdominal:     Palpations: Abdomen is soft.  Musculoskeletal:     Cervical back: Neck supple.  Skin:    General: Skin is warm.     Capillary Refill: Capillary refill takes  less than 2 seconds.  Neurological:     Mental Status: She is alert. Mental status is at baseline.  Psychiatric:        Behavior: Behavior normal.        Thought Content: Thought content normal.        Judgment: Judgment normal.     Ortho Exam Examination of the left knee shows medial joint line tenderness.  There is pain and crepitus throughout range of motion.  Collaterals and cruciates are stable.  No joint effusion. Specialty Comments:  No specialty comments available.  Imaging: XR KNEE 3 VIEW RIGHT  Result Date: 10/04/2022 X-rays show advanced tricompartmental degenerative joint disease.  Bone-on-bone joint space narrowing.  XR KNEE 3 VIEW LEFT  Result Date: 10/04/2022 X-rays show advanced tricompartmental degenerative joint disease.  Bone-on-bone joint space narrowing.  Varus deformity    PMFS History: Patient Active Problem List   Diagnosis Date Noted   Osteoarthritis of both knees 07/22/2022   Achilles tendinitis 03/21/2022   Pre-diabetes 02/27/2022   Vitamin D deficiency 02/27/2022   At risk for diabetes mellitus 02/27/2022   Plantar fasciitis of right foot 01/08/2022   Special screening for malignant neoplasms, colon    Rectal polyp    Essential hypertension 01/29/2021   Class 3 severe obesity with serious comorbidity and body mass index (BMI) of 60.0 to 69.9 in adult ([place]) 01/29/2021   Decreased functional activity tolerance 07/24/2020   Cervical spondylosis 07/24/2020   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   Obstructive sleep apnea syndrome 07/20/2015   Arthritis 01/06/2014   Past Medical History:  Diagnosis Date   B12 deficiency    Bilateral swelling of feet and ankles    Hypertension    Joint pain    Kidney stones    Knee pain    Osteoarthritis    Other fatigue    Prediabetes    Sleep apnea    Thyroid disease    Vitamin D deficiency     Family History  Problem Relation Age of Onset   Healthy Mother    Obesity Mother    Cancer Father    Obesity Father     Cancer - Other Father    Stroke Maternal Grandmother    Lung cancer Maternal Grandfather     Past Surgical History:  Procedure Laterality Date   COLONOSCOPY WITH PROPOFOL N/A 04/10/2021   Procedure: COLONOSCOPY WITH PROPOFOL;  Surgeon: Jackquline Denmark, MD;  Location: [facility];  Service: Endoscopy;  Laterality: N/A;   KIDNEY STONE SURGERY     miniscus repair Left    POLYPECTOMY  04/10/2021   Procedure: POLYPECTOMY;  Surgeon: Jackquline Denmark, MD;  Location: [facility];  Service: Endoscopy;;   SEPTOPLASTY     Social History  Occupational History   Occupation: Retired  Tobacco Use   Smoking status: Never   Smokeless tobacco: Never  Vaping Use   Vaping Use: Never used  Substance and Sexual Activity   Alcohol use: Not Currently   Drug use: Never   Sexual activity: Yes    Partners: Male

## 2022-10-10 ENCOUNTER — Other Ambulatory Visit: Payer: Self-pay | Admitting: Medical

## 2022-10-14 ENCOUNTER — Encounter (INDEPENDENT_AMBULATORY_CARE_PROVIDER_SITE_OTHER): Payer: Self-pay | Admitting: Family Medicine

## 2022-10-14 ENCOUNTER — Ambulatory Visit (INDEPENDENT_AMBULATORY_CARE_PROVIDER_SITE_OTHER): Payer: BC Managed Care – PPO | Admitting: Family Medicine

## 2022-10-14 VITALS — BP 104/57 | HR 85 | Temp 97.5°F | Ht 67.0 in | Wt 289.0 lb

## 2022-10-14 DIAGNOSIS — Z6841 Body Mass Index (BMI) 40.0 and over, adult: Secondary | ICD-10-CM

## 2022-10-14 DIAGNOSIS — M25562 Pain in left knee: Secondary | ICD-10-CM | POA: Diagnosis not present

## 2022-10-14 DIAGNOSIS — R7303 Prediabetes: Secondary | ICD-10-CM

## 2022-10-14 DIAGNOSIS — E669 Obesity, unspecified: Secondary | ICD-10-CM

## 2022-10-14 MED ORDER — METFORMIN HCL 500 MG PO TABS: ORAL_TABLET | ORAL | 0 refills | Status: DC

## 2022-10-14 MED ORDER — LOMAIRA 8 MG PO TABS: 8.0000 mg | ORAL_TABLET | Freq: Every morning | ORAL | 0 refills | Status: DC

## 2022-10-18 IMAGING — DX DG OS CALCIS 2+V*R*
2 series · 2 of 2 positions shown · non-contrast
Comparison: None.

CLINICAL DATA: Right heel pain.  No known injury.

EXAM:
RIGHT OS CALCIS - 2+ VIEW

[calcaneus axial]
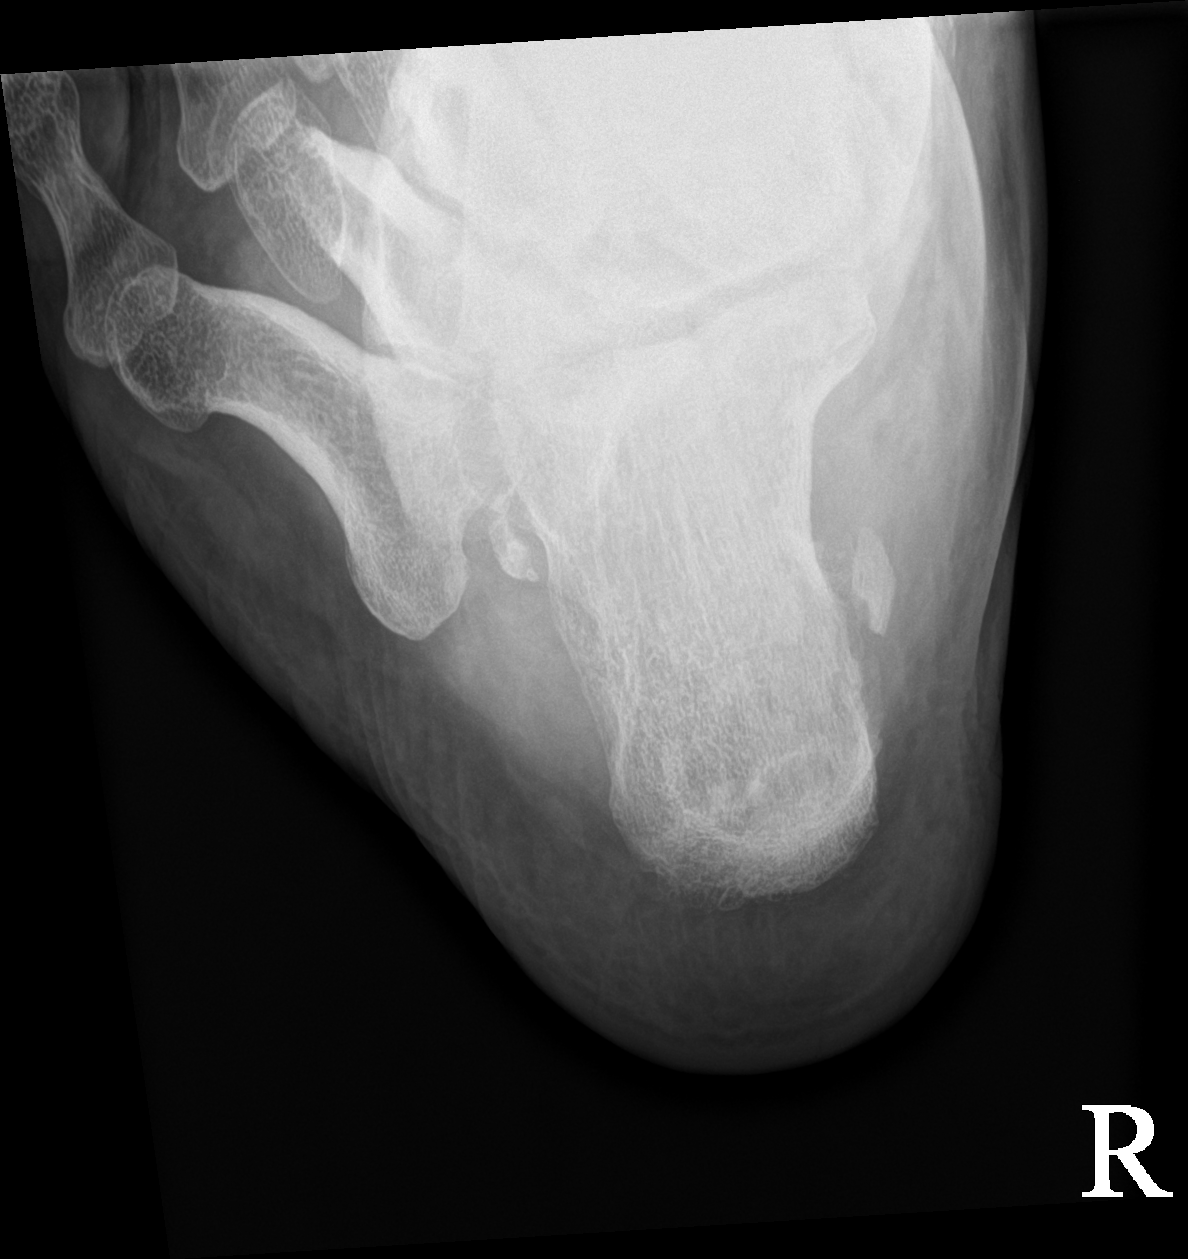

[calcaneus lat]
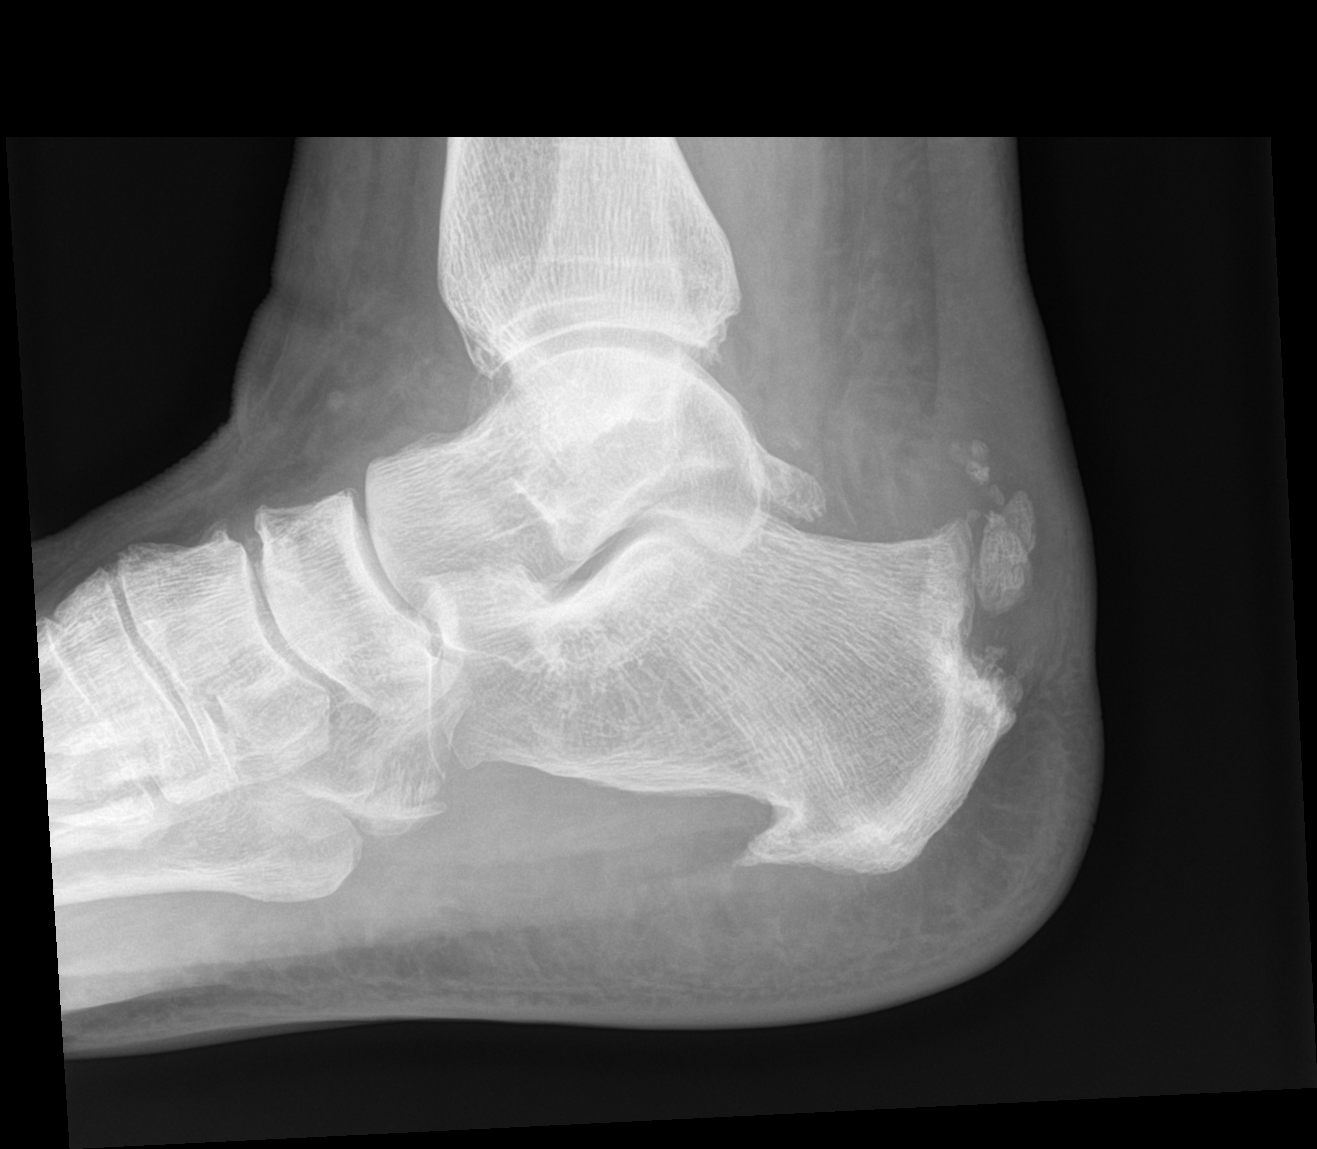

[2 of 2 positions shown; findings below may reference images not displayed]

FINDINGS: Plantar and posterior calcaneal spurs. Calcifications noted along
the posterior calcaneus, likely related to old Achilles injury. No
acute fracture, subluxation or dislocation.
IMPRESSION: No acute bony abnormality.  Chronic changes as above.

## 2022-10-24 NOTE — Progress Notes (Signed)
Chief Complaint:   OBESITY Laura Knox is here to discuss her progress with her obesity treatment plan along with follow-up of her obesity related diagnoses. Laura Knox is on following a lower carbohydrate, vegetable and lean protein rich diet plan and states she is following her eating plan approximately 90% of the time. Laura Knox states she is doing water exercise for 60 minutes 6 times per week.  Today's visit was #: 63 Starting weight: 378 lbs Starting date: 12/27/2020 Today's weight: 289 lbs Today's date: 10/14/2022 Total lbs lost to date: 89 Total lbs lost since last in-office visit: 3  Interim History: Laura Knox continues to do very well with her weight loss, even over the holiday.  She is stable on her medications with no side effects noted.  Her hunger is better controlled.  Subjective:   1. Left knee pain, unspecified chronicity Laura Knox's pain is worsening and the joint feels like it is unstable.  She is unable to jump in the pool due to the pain.  She is scheduled for a knee replacement next month.  2. Pre-diabetes Laura Knox is stable on metformin, and she denies nausea or vomiting.  She continues to do well with her weight loss.  Assessment/Plan:   1. Left knee pain, unspecified chronicity We discussed complete nonweightbearing exercises to do and the need for a walker to avoid injury before surgery.  2. Pre-diabetes Laura Knox will continue metformin 500 mg 2 tablets daily, and we will refill for 1 month.  - metFORMIN (GLUCOPHAGE) 500 MG tablet; 2 po with lunch qd  Dispense: 60 tablet; Refill: 0  3. Obesity, Current BMI 45.3 Laura Knox is currently in the action stage of change. As such, her goal is to continue with weight loss efforts. She has agreed to following a lower carbohydrate, vegetable and lean protein rich diet plan.   We will refill Lomaira for 1 month.   - Phentermine HCl (LOMAIRA) 8 MG TABS; Take 8 mg by mouth every morning. Before 1st meal of the day.  Dispense: 30 tablet;  Refill: 0  Exercise goals: As is.   Behavioral modification strategies: increasing lean protein intake and holiday eating strategies .  Laura Knox has agreed to follow-up with our clinic in 4 weeks. She was informed of the importance of frequent follow-up visits to maximize her success with intensive lifestyle modifications for her multiple health conditions.   Objective:   Blood pressure (!) 104/57, pulse 85, temperature (!) 97.5 F (36.4 C), height 5\' 7"  (1.702 m), weight 289 lb (131.1 kg). Body mass index is 45.26 kg/m.  General: Cooperative, alert, well developed, in no acute distress. HEENT: Conjunctivae and lids unremarkable. Cardiovascular: Regular rhythm.  Lungs: Normal work of breathing. Neurologic: No focal deficits.   Lab Results  Component Value Date   CREATININE 0.65 08/12/2022   BUN 17 08/12/2022   NA 142 08/12/2022   K 3.9 08/12/2022   CL 106 08/12/2022   CO2 22 08/12/2022   Lab Results  Component Value Date   ALT 11 08/12/2022   AST 11 08/12/2022   ALKPHOS 98 08/12/2022   BILITOT 0.6 08/12/2022   Lab Results  Component Value Date   HGBA1C 5.8 (H) 08/12/2022   HGBA1C 5.6 02/06/2022   HGBA1C 5.9 (H) 06/18/2021   HGBA1C 5.9 02/06/2021   HGBA1C 6.0 (H) 12/27/2020   Lab Results  Component Value Date   INSULIN 7.4 08/12/2022   INSULIN 16.7 06/18/2021   INSULIN 20.4 12/27/2020   Lab Results  Component Value Date  TSH 0.353 (L) 08/12/2022   Lab Results  Component Value Date   CHOL 141 12/27/2020   HDL 44 12/27/2020   LDLCALC 81 12/27/2020   TRIG 80 12/27/2020   Lab Results  Component Value Date   VD25OH 41.3 08/12/2022   VD25OH 46.7 02/06/2022   VD25OH 76.3 06/18/2021   Lab Results  Component Value Date   WBC 8.4 02/06/2021   HGB 13.8 02/06/2021   HCT 42 02/06/2021   MCV 87 12/27/2020   PLT 336 02/06/2021   Lab Results  Component Value Date   FERRITIN 88 02/06/2021   Attestation Statements:   Reviewed by clinician on day of visit:  allergies, medications, problem list, medical history, surgical history, family history, social history, and previous encounter notes.   I, Burt Knack, am acting as transcriptionist for Quillian Quince, MD.  I have reviewed the above documentation for accuracy and completeness, and I agree with the above. -  Quillian Quince, MD

## 2022-10-26 ENCOUNTER — Other Ambulatory Visit: Payer: Self-pay | Admitting: Medical

## 2022-10-29 ENCOUNTER — Encounter: Payer: Self-pay | Admitting: Orthopaedic Surgery

## 2022-11-05 ENCOUNTER — Encounter (INDEPENDENT_AMBULATORY_CARE_PROVIDER_SITE_OTHER): Payer: Self-pay | Admitting: Family Medicine

## 2022-11-05 ENCOUNTER — Ambulatory Visit (INDEPENDENT_AMBULATORY_CARE_PROVIDER_SITE_OTHER): Payer: BC Managed Care – PPO | Admitting: Family Medicine

## 2022-11-05 VITALS — BP 144/80 | HR 91 | Temp 98.1°F | Ht 67.0 in | Wt 285.0 lb

## 2022-11-05 DIAGNOSIS — M175 Other unilateral secondary osteoarthritis of knee: Secondary | ICD-10-CM

## 2022-11-05 DIAGNOSIS — R7303 Prediabetes: Secondary | ICD-10-CM

## 2022-11-05 DIAGNOSIS — E669 Obesity, unspecified: Secondary | ICD-10-CM | POA: Diagnosis not present

## 2022-11-05 DIAGNOSIS — E66813 Obesity, class 3: Secondary | ICD-10-CM

## 2022-11-05 DIAGNOSIS — Z6841 Body Mass Index (BMI) 40.0 and over, adult: Secondary | ICD-10-CM | POA: Diagnosis not present

## 2022-11-05 MED ORDER — LOMAIRA 8 MG PO TABS: 8.0000 mg | ORAL_TABLET | Freq: Every morning | ORAL | 0 refills | Status: DC

## 2022-11-07 ENCOUNTER — Other Ambulatory Visit: Payer: Self-pay

## 2022-11-08 ENCOUNTER — Other Ambulatory Visit: Payer: Self-pay | Admitting: Medical

## 2022-11-12 ENCOUNTER — Other Ambulatory Visit: Payer: Self-pay | Admitting: Physician Assistant

## 2022-11-12 MED ORDER — ONDANSETRON HCL 4 MG PO TABS: 4.0000 mg | ORAL_TABLET | Freq: Three times a day (TID) | ORAL | 0 refills | Status: DC | PRN

## 2022-11-12 MED ORDER — HYDROCODONE-ACETAMINOPHEN 7.5-325 MG PO TABS: 1.0000 | ORAL_TABLET | ORAL | 0 refills | Status: DC | PRN

## 2022-11-12 MED ORDER — DOCUSATE SODIUM 100 MG PO CAPS: 100.0000 mg | ORAL_CAPSULE | Freq: Every day | ORAL | 2 refills | Status: DC | PRN

## 2022-11-12 MED ORDER — METHOCARBAMOL 750 MG PO TABS: 750.0000 mg | ORAL_TABLET | Freq: Two times a day (BID) | ORAL | 2 refills | Status: DC | PRN

## 2022-11-12 MED ORDER — RIVAROXABAN 10 MG PO TABS: 10.0000 mg | ORAL_TABLET | Freq: Every day | ORAL | 0 refills | Status: DC

## 2022-11-12 MED ORDER — CEFADROXIL 500 MG PO CAPS: 500.0000 mg | ORAL_CAPSULE | Freq: Two times a day (BID) | ORAL | 0 refills | Status: DC

## 2022-11-12 NOTE — Pre-Procedure Instructions (Signed)
Surgical Instructions    Your procedure is scheduled on Friday 11/22/22.   Report to [facility] Main Entrance "A" at 05:30 A.M., then check in with the Admitting office.  Call this number if you have problems the morning of surgery:  [phone]   If you have any questions prior to your surgery date call [phone]: Open Monday-Friday 8am-4pm If you experience any cold or flu symptoms such as cough, fever, chills, shortness of breath, etc. between now and your scheduled surgery, please notify us at the above number     Remember:  Do not eat after midnight the night before your surgery  You may drink clear liquids until 04:30 A.M. the morning of your surgery.   Clear liquids allowed are: Water, Non-Citrus Juices (without pulp), Carbonated Beverages, Clear Tea, Black Coffee ONLY (NO MILK, CREAM OR POWDERED CREAMER of any kind), and Gatorade  Patient Instructions  The night before surgery:  No food after midnight. ONLY clear liquids after midnight  The day of surgery (if you have diabetes or pre-diabetes): Drink ONE (1) 12 oz G2 given to you in your pre admission testing appointment by 04:30 A.M. the morning of surgery. Drink in one sitting. Do not sip.  This drink was given to you during your hospital  pre-op appointment visit.  Nothing else to drink after completing the  12 oz bottle of G2.         If you have questions, please contact your surgeon's office.             Take these medicines the morning of surgery with A SIP OF WATER:   cetirizine (ZYRTEC)   Please follow your surgeon's instructions regarding Phentermine HCl (LOMAIRA). If you have not received instructions then please contact your surgeon's office for instructions. Ideally this medication should be stopped 2 weeks prior to surgery or as soon as possible.   As of today, STOP taking any Aspirin (unless otherwise instructed by your surgeon) Aleve, Naproxen, Ibuprofen, Motrin, Advil, Goody's, BC's, all herbal  medications, fish oil, meloxicam (MOBIC), and all vitamins.  DO NOT TAKE metFORMIN (GLUCOPHAGE) the morning of surgery.            Do not wear jewelry or makeup. Do not wear lotions, powders, perfumes/cologne or deodorant. Do not shave 48 hours prior to surgery.  Men may shave face and neck. Do not bring valuables to the hospital. Do not wear nail polish, gel polish, artificial nails, or any other type of covering on natural nails (fingers and toes) If you have artificial nails or gel coating that need to be removed by a nail salon, please have this removed prior to surgery. Artificial nails or gel coating may interfere with anesthesia's ability to adequately monitor your vital signs.  [facility] is not responsible for any belongings or valuables.    Do NOT Smoke (Tobacco/Vaping)  24 hours prior to your procedure  If you use a CPAP at night, you may bring your mask for your overnight stay.   Contacts, glasses, hearing aids, dentures or partials may not be worn into surgery, please bring cases for these belongings   For patients admitted to the hospital, discharge time will be determined by your treatment team.   Patients discharged the day of surgery will not be allowed to drive home, and someone needs to stay with them for 24 hours.   SURGICAL WAITING ROOM VISITATION Patients having surgery or a procedure may have no more than 2 support people in  the waiting area - these visitors may rotate.   Children under the age of 53 must have an adult with them who is not the patient. If the patient needs to stay at the hospital during part of their recovery, the visitor guidelines for inpatient rooms apply. Pre-op nurse will coordinate an appropriate time for 1 support person to accompany patient in pre-op.  This support person may not rotate.   Please refer to [email or web address] for the visitor guidelines for Inpatients (after your surgery is  over and you are in a regular room).    Special instructions:    Oral Hygiene is also important to reduce your risk of infection.  Remember - BRUSH YOUR TEETH THE MORNING OF SURGERY WITH YOUR REGULAR TOOTHPASTE   [place]- Preparing For Surgery  Before surgery, you can play an important role. Because skin is not sterile, your skin needs to be as free of germs as possible. You can reduce the number of germs on your skin by washing with CHG (chlorahexidine gluconate) Soap before surgery.  CHG is an antiseptic cleaner which kills germs and bonds with the skin to continue killing germs even after washing.     Please do not use if you have an allergy to CHG or antibacterial soaps. If your skin becomes reddened/irritated stop using the CHG.  Do not shave (including legs and underarms) for at least 48 hours prior to first CHG shower. It is OK to shave your face.  Please follow these instructions carefully.     Shower the NIGHT BEFORE SURGERY and the MORNING OF SURGERY with CHG Soap.   If you chose to wash your hair, wash your hair first as usual with your normal shampoo. After you shampoo, rinse your hair and body thoroughly to remove the shampoo.  Then [employer] and genitals (private parts) with your normal soap and rinse thoroughly to remove soap.  After that Use CHG Soap as you would any other liquid soap. You can apply CHG directly to the skin and wash gently with a scrungie or a clean washcloth.   Apply the CHG Soap to your body ONLY FROM THE NECK DOWN.  Do not use on open wounds or open sores. Avoid contact with your eyes, ears, mouth and genitals (private parts). Wash Face and genitals (private parts)  with your normal soap.   Wash thoroughly, paying special attention to the area where your surgery will be performed.  Thoroughly rinse your body with warm water from the neck down.  DO NOT shower/wash with your normal soap after using and rinsing off the CHG Soap.  Pat yourself dry  with a CLEAN TOWEL.  Wear CLEAN PAJAMAS to bed the night before surgery  Place CLEAN SHEETS on your bed the night before your surgery  DO NOT SLEEP WITH PETS.   Day of Surgery:  Take a shower with CHG soap. Wear Clean/Comfortable clothing the morning of surgery Do not apply any deodorants/lotions.   Remember to brush your teeth WITH YOUR REGULAR TOOTHPASTE.    If you received a COVID test during your pre-op visit, it is requested that you wear a mask when out in public, stay away from anyone that may not be feeling well, and notify your surgeon if you develop symptoms. If you have been in contact with anyone that has tested positive in the last 10 days, please notify your surgeon.    Please read over the following fact sheets that you were given.

## 2022-11-13 ENCOUNTER — Other Ambulatory Visit: Payer: Self-pay

## 2022-11-13 ENCOUNTER — Ambulatory Visit: Payer: BC Managed Care – PPO | Admitting: Family Medicine

## 2022-11-13 ENCOUNTER — Encounter (HOSPITAL_COMMUNITY): Payer: Self-pay

## 2022-11-13 ENCOUNTER — Encounter (HOSPITAL_COMMUNITY)
Admission: RE | Admit: 2022-11-13 | Discharge: 2022-11-13 | Disposition: A | Discharge status: Home / Self Care | Payer: BC Managed Care – PPO | Source: Ambulatory Visit | Attending: Orthopaedic Surgery | Admitting: Orthopaedic Surgery

## 2022-11-13 VITALS — BP 151/82 | HR 109 | Temp 97.6°F | Resp 17

## 2022-11-13 DIAGNOSIS — M1712 Unilateral primary osteoarthritis, left knee: Secondary | ICD-10-CM | POA: Diagnosis not present

## 2022-11-13 DIAGNOSIS — Z01818 Encounter for other preprocedural examination: Secondary | ICD-10-CM

## 2022-11-13 DIAGNOSIS — R9431 Abnormal electrocardiogram [ECG] [EKG]: Secondary | ICD-10-CM | POA: Insufficient documentation

## 2022-11-13 HISTORY — DX: Prediabetes: R73.03

## 2022-11-13 LAB — CBC
HCT: 44.7 % (ref 36.0–46.0)
Hemoglobin: 14.9 g/dL (ref 12.0–15.0)
MCH: 29 pg (ref 26.0–34.0)
MCHC: 33.3 g/dL (ref 30.0–36.0)
MCV: 87.1 fL (ref 80.0–100.0)
Platelets: 361 10*3/uL (ref 150–400)
RBC: 5.13 MIL/uL — ABNORMAL HIGH (ref 3.87–5.11)
RDW: 12.6 % (ref 11.5–15.5)
WBC: 11.2 10*3/uL — ABNORMAL HIGH (ref 4.0–10.5)
nRBC: 0 % (ref 0.0–0.2)

## 2022-11-13 LAB — BASIC METABOLIC PANEL
Anion gap: 8 (ref 5–15)
BUN: 17 mg/dL (ref 6–20)
CO2: 25 mmol/L (ref 22–32)
Calcium: 8.8 mg/dL — ABNORMAL LOW (ref 8.9–10.3)
Chloride: 104 mmol/L (ref 98–111)
Creatinine, Ser: 0.65 mg/dL (ref 0.44–1.00)
GFR, Estimated: >60 mL/min (ref >60)
Glucose, Bld: 97 mg/dL (ref 70–99)
Potassium: 3.4 mmol/L — ABNORMAL LOW (ref 3.5–5.1)
Sodium: 137 mmol/L (ref 135–145)

## 2022-11-13 LAB — SURGICAL PCR SCREEN
MRSA, PCR: NEGATIVE
Staphylococcus aureus: NEGATIVE

## 2022-11-13 NOTE — Progress Notes (Addendum)
PCP - Dr.Edward [place]  Cardiologist - pt denies  PPM/ICD - pt denies Device Orders - n/a Rep Notified - n/a  Chest x-ray - n/a EKG - 11/13/22 Stress Test - pt denies ECHO - pt denies Cardiac Cath - pt denies  Sleep Study - YES CPAP - pt denies using CPAP. Patient sleeps with head of bed elevated.   Patient denies Diabetes.  Fasting Blood Sugar - pt is a pre-diabetic. Pt does not check Blood Sugar at home. Checks Blood Sugar _____ times a day  Last dose of GLP1 agonist-  pt denies GLP1 instructions: n/a  Blood Thinner Instructions:pt denies Aspirin Instructions:pt denies  ERAS Protcol -YES PRE-SURGERY G2- given at PAT today.   COVID TEST- n/a   Anesthesia review: YES, abnormal EKG.   Patient aware to stop taking Phentermine starting today. Patient visibly upset during PAT visit today, she has many questions regarding rehab and medications for after surgery. I encouraged patient to call Dr.Xu's office.   Patient denies shortness of breath, fever, cough and chest pain at PAT appointment   All instructions explained to the patient, with a verbal understanding of the material. Patient agrees to go over the instructions while at home for a better understanding. Patient also instructed to self quarantine after being tested for COVID-19. The opportunity to ask questions was provided.

## 2022-11-18 ENCOUNTER — Other Ambulatory Visit: Payer: Self-pay | Admitting: Physician Assistant

## 2022-11-18 NOTE — Progress Notes (Signed)
Chief Complaint:   OBESITY Laura Knox is here to discuss her progress with her obesity treatment plan along with follow-up of her obesity related diagnoses. Laura Knox is on following a lower carbohydrate, vegetable and lean protein rich diet plan and states she is following her eating plan approximately 50% of the time. Laura Knox states she is doing water aerobics for 60-120 minutes 6 times per week.  Today's visit was #: 16 Starting weight: 378 lbs Starting date: 12/27/2020 Today's weight: 235 lbs Today's date: 11/05/2022 Total lbs lost to date: 43 Total lbs lost since last in-office visit: 4  Interim History: Laura Knox has done very well with weight loss over the holidays. She has surgery for her left knee in 2 weeks, and she will be going to rehab post-op.   Subjective:   1. Pre-diabetes Laura Knox is on metformin 1,000 mg daily at lunch, with no side effects noted. She is working on decreasing simple carbohydrates and increasing lean protein to promote weight loss.   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   Assessment/Plan:   1. Pre-diabetes Laura Knox will continue metformin, and will work on her eating plan and exercise to promote weight loss.   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   3. Obesity, Current BMI 44.7 Laura Knox will continue Lomaira, and we will refill for 1 month.   - Phentermine HCl (LOMAIRA) 8 MG TABS; Take 8 mg by mouth every morning. Before 1st meal of the day.  Dispense: 30 tablet; Refill: 0  Laura Knox is currently in the action stage of change. As such, her goal is to continue with weight loss efforts. She has agreed to following a lower carbohydrate, vegetable and lean protein rich diet plan.   We discussed strategies for post-op recovery.   Exercise goals: As is.   Behavioral  modification strategies: increasing lean protein intake, avoiding temptations, and planning for success.  Laura Knox has agreed to follow-up with our clinic in 3 to 4 weeks. She was informed of the importance of frequent follow-up visits to maximize her success with intensive lifestyle modifications for her multiple health conditions.   Objective:   Blood pressure (!) 144/80, pulse 91, temperature 98.1 F (36.7 C), height 5\' 7"  (1.702 m), weight 285 lb (129.3 kg), SpO2 98 %. Body mass index is 44.64 kg/m.  General: Cooperative, alert, well developed, in no acute distress. HEENT: Conjunctivae and lids unremarkable. Cardiovascular: Regular rhythm.  Lungs: Normal work of breathing. Neurologic: No focal deficits.   Lab Results  Component Value Date   CREATININE 0.65 11/13/2022   BUN 17 11/13/2022   NA 137 11/13/2022   K 3.4 (L) 11/13/2022   CL 104 11/13/2022   CO2 25 11/13/2022   Lab Results  Component Value Date   ALT 11 08/12/2022   AST 11 08/12/2022   ALKPHOS 98 08/12/2022   BILITOT 0.6 08/12/2022   Lab Results  Component Value Date   HGBA1C 5.8 (H) 08/12/2022   HGBA1C 5.6 02/06/2022   HGBA1C 5.9 (H) 06/18/2021   HGBA1C 5.9 02/06/2021   HGBA1C 6.0 (H) 12/27/2020   Lab Results  Component Value Date   INSULIN 7.4 08/12/2022   INSULIN 16.7 06/18/2021   INSULIN 20.4 12/27/2020   Lab Results  Component Value Date   TSH 0.353 (L) 08/12/2022   Lab  Results  Component Value Date   CHOL 141 12/27/2020   HDL 44 12/27/2020   LDLCALC 81 12/27/2020   TRIG 80 12/27/2020   Lab Results  Component Value Date   VD25OH 41.3 08/12/2022   VD25OH 46.7 02/06/2022   VD25OH 76.3 06/18/2021   Lab Results  Component Value Date   WBC 11.2 (H) 11/13/2022   HGB 14.9 11/13/2022   HCT 44.7 11/13/2022   MCV 87.1 11/13/2022   PLT 361 11/13/2022   Lab Results  Component Value Date   FERRITIN 88 02/06/2021   Attestation Statements:   Reviewed by clinician on day of visit:  allergies, medications, problem list, medical history, surgical history, family history, social history, and previous encounter notes.   I, Trixie Dredge, am acting as transcriptionist for Dennard Nip, MD.  I have reviewed the above documentation for accuracy and completeness, and I agree with the above. -  Dennard Nip, MD

## 2022-11-19 ENCOUNTER — Telehealth: Payer: Self-pay | Admitting: *Deleted

## 2022-11-19 NOTE — Telephone Encounter (Signed)
Left VM yesterday for patient to assist with answering questions prior to upcoming knee replacement surgery. Patient returned call after 5 pm on Monday. Again reached out today and left VM for her to return call.

## 2022-11-21 DIAGNOSIS — M1711 Unilateral primary osteoarthritis, right knee: Secondary | ICD-10-CM | POA: Insufficient documentation

## 2022-11-21 DIAGNOSIS — M1712 Unilateral primary osteoarthritis, left knee: Secondary | ICD-10-CM | POA: Insufficient documentation

## 2022-11-21 NOTE — Anesthesia Preprocedure Evaluation (Addendum)
Anesthesia Evaluation  Patient identified by MRN, date of birth, ID band Patient awake    Reviewed: Allergy & Precautions, H&P , NPO status , Patient's Chart, lab work & pertinent test results  Airway Mallampati: II  TM Distance: >3 FB Neck ROM: Full    Dental no notable dental hx. (+) Teeth Intact, Dental Advisory Given   Pulmonary neg pulmonary ROS, sleep apnea    Pulmonary exam normal breath sounds clear to auscultation       Cardiovascular Exercise Tolerance: Good hypertension, negative cardio ROS Normal cardiovascular exam Rhythm:Regular Rate:Normal     Neuro/Psych negative neurological ROS  negative psych ROS   GI/Hepatic negative GI ROS, Neg liver ROS,,,  Endo/Other  negative endocrine ROS  Morbid obesity  Renal/GU Renal diseasenegative Renal ROS  negative genitourinary   Musculoskeletal negative musculoskeletal ROS (+) Arthritis , Osteoarthritis,    Abdominal   Peds negative pediatric ROS (+)  Hematology negative hematology ROS (+)   Anesthesia Other Findings   Reproductive/Obstetrics negative OB ROS                             Anesthesia Physical Anesthesia Plan  ASA: 3  Anesthesia Plan: General and Regional   Post-op Pain Management: Regional block*   Induction:   PONV Risk Score and Plan: 3 and Treatment may vary due to age or medical condition and Propofol infusion  Airway Management Planned: Natural Airway and Simple Face Mask  Additional Equipment: None  Intra-op Plan:   Post-operative Plan:   Informed Consent: I have reviewed the patients History and Physical, chart, labs and discussed the procedure including the risks, benefits and alternatives for the proposed anesthesia with the patient or authorized representative who has indicated his/her understanding and acceptance.       Plan Discussed with: Anesthesiologist  Anesthesia Plan Comments:         Anesthesia Quick Evaluation

## 2022-11-22 ENCOUNTER — Encounter (HOSPITAL_COMMUNITY)
Admission: RE | Disposition: A | Discharge status: Home Health | Payer: Self-pay | Source: Home / Self Care | Attending: Orthopaedic Surgery

## 2022-11-22 ENCOUNTER — Observation Stay (HOSPITAL_COMMUNITY): Payer: BC Managed Care – PPO | Admitting: Physician Assistant

## 2022-11-22 ENCOUNTER — Observation Stay (HOSPITAL_COMMUNITY)
Admission: RE | Admit: 2022-11-22 | Discharge: 2022-11-23 | Disposition: A | Discharge status: Home Health | Payer: BC Managed Care – PPO | Attending: Orthopaedic Surgery | Admitting: Orthopaedic Surgery

## 2022-11-22 ENCOUNTER — Other Ambulatory Visit: Payer: Self-pay

## 2022-11-22 ENCOUNTER — Observation Stay (HOSPITAL_COMMUNITY): Payer: BC Managed Care – PPO

## 2022-11-22 ENCOUNTER — Encounter (HOSPITAL_COMMUNITY): Payer: Self-pay | Admitting: Orthopaedic Surgery

## 2022-11-22 ENCOUNTER — Encounter: Payer: Self-pay | Admitting: Family Medicine

## 2022-11-22 DIAGNOSIS — Z7984 Long term (current) use of oral hypoglycemic drugs: Secondary | ICD-10-CM | POA: Insufficient documentation

## 2022-11-22 DIAGNOSIS — I10 Essential (primary) hypertension: Secondary | ICD-10-CM | POA: Insufficient documentation

## 2022-11-22 DIAGNOSIS — M179 Osteoarthritis of knee, unspecified: Secondary | ICD-10-CM | POA: Diagnosis present

## 2022-11-22 DIAGNOSIS — T797XXA Traumatic subcutaneous emphysema, initial encounter: Secondary | ICD-10-CM | POA: Diagnosis not present

## 2022-11-22 DIAGNOSIS — Z79899 Other long term (current) drug therapy: Secondary | ICD-10-CM | POA: Insufficient documentation

## 2022-11-22 DIAGNOSIS — Z7901 Long term (current) use of anticoagulants: Secondary | ICD-10-CM | POA: Diagnosis not present

## 2022-11-22 DIAGNOSIS — Z471 Aftercare following joint replacement surgery: Secondary | ICD-10-CM | POA: Diagnosis not present

## 2022-11-22 DIAGNOSIS — Z96652 Presence of left artificial knee joint: Secondary | ICD-10-CM | POA: Diagnosis not present

## 2022-11-22 DIAGNOSIS — M1711 Unilateral primary osteoarthritis, right knee: Secondary | ICD-10-CM | POA: Insufficient documentation

## 2022-11-22 DIAGNOSIS — M1712 Unilateral primary osteoarthritis, left knee: Principal | ICD-10-CM | POA: Insufficient documentation

## 2022-11-22 DIAGNOSIS — G8918 Other acute postprocedural pain: Secondary | ICD-10-CM | POA: Diagnosis not present

## 2022-11-22 DIAGNOSIS — R600 Localized edema: Secondary | ICD-10-CM | POA: Diagnosis not present

## 2022-11-22 DIAGNOSIS — M21162 Varus deformity, not elsewhere classified, left knee: Secondary | ICD-10-CM | POA: Diagnosis not present

## 2022-11-22 DIAGNOSIS — Z01818 Encounter for other preprocedural examination: Secondary | ICD-10-CM

## 2022-11-22 HISTORY — PX: TOTAL KNEE ARTHROPLASTY: SHX125

## 2022-11-22 SURGERY — ARTHROPLASTY, KNEE, TOTAL
Anesthesia: Regional | Site: Knee | Laterality: Left

## 2022-11-22 MED ORDER — TRAMADOL HCL 50 MG PO TABS
50.0000 mg | ORAL_TABLET | Freq: Four times a day (QID) | ORAL | Status: DC
  Administered 2022-11-23: 50 mg via ORAL
  Filled 2022-11-22 (×3): qty 1

## 2022-11-22 MED ORDER — LIDOCAINE 2% (20 MG/ML) 5 ML SYRINGE
INTRAMUSCULAR | Status: DC | PRN
  Administered 2022-11-22: 40 mg via INTRAVENOUS

## 2022-11-22 MED ORDER — METOCLOPRAMIDE HCL 5 MG PO TABS: 5.0000 mg | ORAL_TABLET | Freq: Three times a day (TID) | ORAL | Status: DC | PRN

## 2022-11-22 MED ORDER — POVIDONE-IODINE 10 % EX SWAB
2.0000 | Freq: Once | CUTANEOUS | Status: AC
  Administered 2022-11-22: 2 via TOPICAL

## 2022-11-22 MED ORDER — LOSARTAN POTASSIUM 50 MG PO TABS: 100.0000 mg | ORAL_TABLET | Freq: Every day | ORAL | Status: DC

## 2022-11-22 MED ORDER — LIDOCAINE 2% (20 MG/ML) 5 ML SYRINGE
INTRAMUSCULAR | Status: AC
  Filled 2022-11-22: qty 5

## 2022-11-22 MED ORDER — DOCUSATE SODIUM 100 MG PO CAPS
100.0000 mg | ORAL_CAPSULE | Freq: Two times a day (BID) | ORAL | Status: DC
  Administered 2022-11-22 (×2): 100 mg via ORAL
  Filled 2022-11-22 (×3): qty 1

## 2022-11-22 MED ORDER — SODIUM CHLORIDE 0.9 % IV SOLN: INTRAVENOUS | Status: DC

## 2022-11-22 MED ORDER — METHOCARBAMOL 1000 MG/10ML IJ SOLN: 500.0000 mg | Freq: Four times a day (QID) | INTRAVENOUS | Status: DC | PRN

## 2022-11-22 MED ORDER — TRANEXAMIC ACID 1000 MG/10ML IV SOLN: 2000.0000 mg | Freq: Once | INTRAVENOUS | Status: DC

## 2022-11-22 MED ORDER — KETOROLAC TROMETHAMINE 15 MG/ML IJ SOLN
15.0000 mg | Freq: Four times a day (QID) | INTRAMUSCULAR | Status: AC
  Administered 2022-11-22 – 2022-11-23 (×3): 15 mg via INTRAVENOUS
  Filled 2022-11-22 (×3): qty 1

## 2022-11-22 MED ORDER — FERROUS SULFATE 325 (65 FE) MG PO TABS
325.0000 mg | ORAL_TABLET | Freq: Every day | ORAL | Status: DC
  Administered 2022-11-22 – 2022-11-23 (×2): 325 mg via ORAL
  Filled 2022-11-22 (×2): qty 1

## 2022-11-22 MED ORDER — ONDANSETRON HCL 4 MG/2ML IJ SOLN
INTRAMUSCULAR | Status: AC
  Filled 2022-11-22: qty 2

## 2022-11-22 MED ORDER — CEFAZOLIN SODIUM-DEXTROSE 2-4 GM/100ML-% IV SOLN
2.0000 g | Freq: Four times a day (QID) | INTRAVENOUS | Status: AC
  Administered 2022-11-22 (×2): 2 g via INTRAVENOUS
  Filled 2022-11-22 (×2): qty 100

## 2022-11-22 MED ORDER — OXYCODONE HCL 5 MG PO TABS: 5.0000 mg | ORAL_TABLET | Freq: Once | ORAL | Status: AC | PRN

## 2022-11-22 MED ORDER — PROPOFOL 10 MG/ML IV BOLUS
INTRAVENOUS | Status: AC
  Filled 2022-11-22: qty 20

## 2022-11-22 MED ORDER — PHENYLEPHRINE 80 MCG/ML (10ML) SYRINGE FOR IV PUSH (FOR BLOOD PRESSURE SUPPORT)
PREFILLED_SYRINGE | INTRAVENOUS | Status: AC
  Filled 2022-11-22: qty 10

## 2022-11-22 MED ORDER — ACETAMINOPHEN 325 MG PO TABS: 325.0000 mg | ORAL_TABLET | ORAL | Status: DC | PRN

## 2022-11-22 MED ORDER — RIVAROXABAN 10 MG PO TABS
10.0000 mg | ORAL_TABLET | Freq: Every day | ORAL | Status: DC
  Administered 2022-11-23: 10 mg via ORAL
  Filled 2022-11-22: qty 1

## 2022-11-22 MED ORDER — KETAMINE HCL 50 MG/5ML IJ SOSY
PREFILLED_SYRINGE | INTRAMUSCULAR | Status: AC
  Filled 2022-11-22: qty 5

## 2022-11-22 MED ORDER — GLYCOPYRROLATE 0.2 MG/ML IJ SOLN
INTRAMUSCULAR | Status: DC | PRN
  Administered 2022-11-22: .2 mg via INTRAVENOUS

## 2022-11-22 MED ORDER — PHENYLEPHRINE HCL-NACL 20-0.9 MG/250ML-% IV SOLN
INTRAVENOUS | Status: DC | PRN
  Administered 2022-11-22: 5 ug/min via INTRAVENOUS

## 2022-11-22 MED ORDER — PHENOL 1.4 % MT LIQD: 1.0000 | OROMUCOSAL | Status: DC | PRN

## 2022-11-22 MED ORDER — BUPIVACAINE-MELOXICAM ER 400-12 MG/14ML IJ SOLN
INTRAMUSCULAR | Status: AC
  Filled 2022-11-22: qty 1

## 2022-11-22 MED ORDER — FENTANYL CITRATE (PF) 250 MCG/5ML IJ SOLN
INTRAMUSCULAR | Status: DC | PRN
  Administered 2022-11-22: 25 ug via INTRAVENOUS
  Administered 2022-11-22: 50 ug via INTRAVENOUS
  Administered 2022-11-22 (×3): 25 ug via INTRAVENOUS

## 2022-11-22 MED ORDER — CHLORHEXIDINE GLUCONATE 0.12 % MT SOLN
15.0000 mL | Freq: Once | OROMUCOSAL | Status: AC
  Administered 2022-11-22: 15 mL via OROMUCOSAL
  Filled 2022-11-22: qty 15

## 2022-11-22 MED ORDER — TRANEXAMIC ACID 1000 MG/10ML IV SOLN
2000.0000 mg | INTRAVENOUS | Status: DC
  Filled 2022-11-22: qty 20

## 2022-11-22 MED ORDER — ONDANSETRON HCL 4 MG/2ML IJ SOLN: 4.0000 mg | Freq: Once | INTRAMUSCULAR | Status: DC | PRN

## 2022-11-22 MED ORDER — DEXAMETHASONE SODIUM PHOSPHATE 10 MG/ML IJ SOLN
INTRAMUSCULAR | Status: DC | PRN
  Administered 2022-11-22: 5 mg via INTRAVENOUS

## 2022-11-22 MED ORDER — MEPERIDINE HCL 25 MG/ML IJ SOLN: 6.2500 mg | INTRAMUSCULAR | Status: DC | PRN

## 2022-11-22 MED ORDER — METOCLOPRAMIDE HCL 5 MG/ML IJ SOLN: 5.0000 mg | Freq: Three times a day (TID) | INTRAMUSCULAR | Status: DC | PRN

## 2022-11-22 MED ORDER — MIDAZOLAM HCL 2 MG/2ML IJ SOLN
INTRAMUSCULAR | Status: DC | PRN
  Administered 2022-11-22: 2 mg via INTRAVENOUS

## 2022-11-22 MED ORDER — CEFAZOLIN SODIUM-DEXTROSE 2-4 GM/100ML-% IV SOLN
2.0000 g | INTRAVENOUS | Status: AC
  Administered 2022-11-22: 2 g via INTRAVENOUS
  Filled 2022-11-22: qty 100

## 2022-11-22 MED ORDER — ORAL CARE MOUTH RINSE: 15.0000 mL | Freq: Once | OROMUCOSAL | Status: AC

## 2022-11-22 MED ORDER — BUPIVACAINE IN DEXTROSE 0.75-8.25 % IT SOLN
INTRATHECAL | Status: DC | PRN
  Administered 2022-11-22: 1.8 mL via INTRATHECAL

## 2022-11-22 MED ORDER — BUPIVACAINE-MELOXICAM ER 400-12 MG/14ML IJ SOLN
INTRAMUSCULAR | Status: DC | PRN
  Administered 2022-11-22: 400 mg

## 2022-11-22 MED ORDER — TRANEXAMIC ACID-NACL 1000-0.7 MG/100ML-% IV SOLN
1000.0000 mg | Freq: Once | INTRAVENOUS | Status: AC
  Administered 2022-11-22: 1000 mg via INTRAVENOUS
  Filled 2022-11-22: qty 100

## 2022-11-22 MED ORDER — HYDROMORPHONE HCL 1 MG/ML IJ SOLN: 0.5000 mg | INTRAMUSCULAR | Status: DC | PRN

## 2022-11-22 MED ORDER — PHENYLEPHRINE HCL-NACL 20-0.9 MG/250ML-% IV SOLN
INTRAVENOUS | Status: AC
  Filled 2022-11-22: qty 250

## 2022-11-22 MED ORDER — PROPOFOL 10 MG/ML IV BOLUS
INTRAVENOUS | Status: DC | PRN
  Administered 2022-11-22: 100 ug/kg/min via INTRAVENOUS

## 2022-11-22 MED ORDER — ROPIVACAINE HCL 5 MG/ML IJ SOLN
INTRAMUSCULAR | Status: DC | PRN
  Administered 2022-11-22: 25 mL via PERINEURAL

## 2022-11-22 MED ORDER — ONDANSETRON HCL 4 MG PO TABS: 4.0000 mg | ORAL_TABLET | Freq: Four times a day (QID) | ORAL | Status: DC | PRN

## 2022-11-22 MED ORDER — HYDROMORPHONE HCL 1 MG/ML IJ SOLN
0.5000 mg | INTRAMUSCULAR | Status: AC | PRN
  Administered 2022-11-22 (×2): 0.5 mg via INTRAVENOUS

## 2022-11-22 MED ORDER — SODIUM CHLORIDE 0.9 % IR SOLN
Status: DC | PRN
  Administered 2022-11-22: 1000 mL

## 2022-11-22 MED ORDER — MENTHOL 3 MG MT LOZG: 1.0000 | LOZENGE | OROMUCOSAL | Status: DC | PRN

## 2022-11-22 MED ORDER — FENTANYL CITRATE (PF) 100 MCG/2ML IJ SOLN
25.0000 ug | INTRAMUSCULAR | Status: DC | PRN
  Administered 2022-11-22: 50 ug via INTRAVENOUS

## 2022-11-22 MED ORDER — METHOCARBAMOL 500 MG PO TABS
ORAL_TABLET | ORAL | Status: AC
  Administered 2022-11-22: 500 mg via ORAL
  Filled 2022-11-22: qty 1

## 2022-11-22 MED ORDER — DEXAMETHASONE SODIUM PHOSPHATE 10 MG/ML IJ SOLN
INTRAMUSCULAR | Status: AC
  Filled 2022-11-22: qty 1

## 2022-11-22 MED ORDER — MIDAZOLAM HCL 2 MG/2ML IJ SOLN
INTRAMUSCULAR | Status: AC
  Filled 2022-11-22: qty 2

## 2022-11-22 MED ORDER — VANCOMYCIN HCL 1000 MG IV SOLR
INTRAVENOUS | Status: DC | PRN
  Administered 2022-11-22: 1000 mg

## 2022-11-22 MED ORDER — DEXAMETHASONE SODIUM PHOSPHATE 10 MG/ML IJ SOLN
INTRAMUSCULAR | Status: DC | PRN
  Administered 2022-11-22: 10 mg

## 2022-11-22 MED ORDER — KETAMINE HCL 10 MG/ML IJ SOLN
INTRAMUSCULAR | Status: DC | PRN
  Administered 2022-11-22 (×3): 10 mg via INTRAVENOUS

## 2022-11-22 MED ORDER — HYDROMORPHONE HCL 1 MG/ML IJ SOLN
INTRAMUSCULAR | Status: AC
  Administered 2022-11-22: 0.5 mg via INTRAVENOUS
  Filled 2022-11-22: qty 1

## 2022-11-22 MED ORDER — TRANEXAMIC ACID 1000 MG/10ML IV SOLN
INTRAVENOUS | Status: DC | PRN
  Administered 2022-11-22: 2000 mg via TOPICAL

## 2022-11-22 MED ORDER — LACTATED RINGERS IV SOLN: INTRAVENOUS | Status: DC

## 2022-11-22 MED ORDER — ONDANSETRON HCL 4 MG/2ML IJ SOLN
INTRAMUSCULAR | Status: DC | PRN
  Administered 2022-11-22: 4 mg via INTRAVENOUS

## 2022-11-22 MED ORDER — TRANEXAMIC ACID-NACL 1000-0.7 MG/100ML-% IV SOLN
1000.0000 mg | INTRAVENOUS | Status: AC
  Administered 2022-11-22: 1000 mg via INTRAVENOUS
  Filled 2022-11-22: qty 100

## 2022-11-22 MED ORDER — VANCOMYCIN HCL 1000 MG IV SOLR
INTRAVENOUS | Status: AC
  Filled 2022-11-22: qty 20

## 2022-11-22 MED ORDER — 0.9 % SODIUM CHLORIDE (POUR BTL) OPTIME
TOPICAL | Status: DC | PRN
  Administered 2022-11-22: 1000 mL

## 2022-11-22 MED ORDER — OXYCODONE HCL 5 MG PO TABS
ORAL_TABLET | ORAL | Status: AC
  Administered 2022-11-22: 5 mg via ORAL
  Filled 2022-11-22: qty 2

## 2022-11-22 MED ORDER — PHENYLEPHRINE 80 MCG/ML (10ML) SYRINGE FOR IV PUSH (FOR BLOOD PRESSURE SUPPORT)
PREFILLED_SYRINGE | INTRAVENOUS | Status: DC | PRN
  Administered 2022-11-22 (×2): 80 ug via INTRAVENOUS

## 2022-11-22 MED ORDER — FENTANYL CITRATE (PF) 100 MCG/2ML IJ SOLN
INTRAMUSCULAR | Status: AC
  Administered 2022-11-22: 50 ug via INTRAVENOUS
  Filled 2022-11-22: qty 2

## 2022-11-22 MED ORDER — OXYCODONE HCL 5 MG/5ML PO SOLN: 5.0000 mg | Freq: Once | ORAL | Status: AC | PRN

## 2022-11-22 MED ORDER — HYDROMORPHONE HCL 2 MG PO TABS
1.0000 mg | ORAL_TABLET | ORAL | Status: DC | PRN
  Administered 2022-11-22: 2 mg via ORAL
  Administered 2022-11-22: 1 mg via ORAL
  Filled 2022-11-22 (×2): qty 1

## 2022-11-22 MED ORDER — METHOCARBAMOL 500 MG PO TABS
500.0000 mg | ORAL_TABLET | Freq: Four times a day (QID) | ORAL | Status: DC | PRN
  Administered 2022-11-23: 500 mg via ORAL
  Filled 2022-11-22: qty 1

## 2022-11-22 MED ORDER — ACETAMINOPHEN 500 MG PO TABS
1000.0000 mg | ORAL_TABLET | Freq: Four times a day (QID) | ORAL | Status: AC
  Administered 2022-11-22 – 2022-11-23 (×4): 1000 mg via ORAL
  Filled 2022-11-22 (×4): qty 2

## 2022-11-22 MED ORDER — IRRISEPT - 450ML BOTTLE WITH 0.05% CHG IN STERILE WATER, USP 99.95% OPTIME
TOPICAL | Status: DC | PRN
  Administered 2022-11-22: 450 mL

## 2022-11-22 MED ORDER — KETOROLAC TROMETHAMINE 15 MG/ML IJ SOLN
INTRAMUSCULAR | Status: AC
  Administered 2022-11-22: 15 mg via INTRAVENOUS
  Filled 2022-11-22: qty 1

## 2022-11-22 MED ORDER — ACETAMINOPHEN 160 MG/5ML PO SOLN: 325.0000 mg | ORAL | Status: DC | PRN

## 2022-11-22 MED ORDER — FENTANYL CITRATE (PF) 250 MCG/5ML IJ SOLN
INTRAMUSCULAR | Status: AC
  Filled 2022-11-22: qty 5

## 2022-11-22 MED ORDER — CEFADROXIL 500 MG PO CAPS
500.0000 mg | ORAL_CAPSULE | Freq: Two times a day (BID) | ORAL | Status: DC
  Administered 2022-11-23: 500 mg via ORAL
  Filled 2022-11-22 (×2): qty 1

## 2022-11-22 MED ORDER — ACETAMINOPHEN 325 MG PO TABS: 325.0000 mg | ORAL_TABLET | Freq: Four times a day (QID) | ORAL | Status: DC | PRN

## 2022-11-22 MED ORDER — ROCURONIUM BROMIDE 10 MG/ML (PF) SYRINGE
PREFILLED_SYRINGE | INTRAVENOUS | Status: AC
  Filled 2022-11-22: qty 10

## 2022-11-22 MED ORDER — GLYCOPYRROLATE PF 0.2 MG/ML IJ SOSY
PREFILLED_SYRINGE | INTRAMUSCULAR | Status: AC
  Filled 2022-11-22: qty 1

## 2022-11-22 MED ORDER — ONDANSETRON HCL 4 MG/2ML IJ SOLN
4.0000 mg | Freq: Four times a day (QID) | INTRAMUSCULAR | Status: DC | PRN
  Administered 2022-11-22: 4 mg via INTRAVENOUS
  Filled 2022-11-22: qty 2

## 2022-11-22 MED ORDER — DEXAMETHASONE SODIUM PHOSPHATE 10 MG/ML IJ SOLN
10.0000 mg | Freq: Once | INTRAMUSCULAR | Status: AC
  Administered 2022-11-23: 10 mg via INTRAVENOUS
  Filled 2022-11-22: qty 1

## 2022-11-22 MED ORDER — HYDROMORPHONE HCL 2 MG PO TABS
2.0000 mg | ORAL_TABLET | ORAL | Status: DC | PRN
  Administered 2022-11-22 – 2022-11-23 (×4): 2 mg via ORAL
  Filled 2022-11-22 (×4): qty 1

## 2022-11-22 SURGICAL SUPPLY — 83 items
ALCOHOL 70% 16 OZ (MISCELLANEOUS) ×1 IMPLANT
BAG COUNTER SPONGE SURGICOUNT (BAG) IMPLANT
BAG DECANTER FOR FLEXI CONT (MISCELLANEOUS) ×1 IMPLANT
BANDAGE ESMARK 6X9 LF (GAUZE/BANDAGES/DRESSINGS) IMPLANT
BLADE SAG 18X100X1.27 (BLADE) ×1 IMPLANT
BLADE SAW SGTL 73X25 THK (BLADE) ×1 IMPLANT
BNDG ESMARK 6X9 LF (GAUZE/BANDAGES/DRESSINGS)
BOWL SMART MIX CTS (DISPOSABLE) ×1 IMPLANT
CLSR STERI-STRIP ANTIMIC 1/2X4 (GAUZE/BANDAGES/DRESSINGS) ×2 IMPLANT
COMP FEM KNEE STD PS 8 LT (Joint) ×1 IMPLANT
COMP PATELLAR 10X35 METAL (Joint) ×1 IMPLANT
COMPONENT FEM KNEE STD PS 8 LT (Joint) IMPLANT
COMPONENT PATELLAR 10X35 METAL (Joint) IMPLANT
COOLER ICEMAN CLASSIC (MISCELLANEOUS) ×1 IMPLANT
COVER SURGICAL LIGHT HANDLE (MISCELLANEOUS) ×1 IMPLANT
CUFF TOURN SGL QUICK 34 (TOURNIQUET CUFF) ×1
CUFF TOURN SGL QUICK 42 (TOURNIQUET CUFF) IMPLANT
CUFF TRNQT CYL 34X4.125X (TOURNIQUET CUFF) ×1 IMPLANT
DERMABOND ADVANCED .7 DNX12 (GAUZE/BANDAGES/DRESSINGS) ×1 IMPLANT
DRAPE EXTREMITY T 121X128X90 (DISPOSABLE) ×1 IMPLANT
DRAPE HALF SHEET 40X57 (DRAPES) ×1 IMPLANT
DRAPE INCISE IOBAN 66X45 STRL (DRAPES) ×1 IMPLANT
DRAPE ORTHO SPLIT 77X108 STRL (DRAPES)
DRAPE POUCH INSTRU U-SHP 10X18 (DRAPES) ×1 IMPLANT
DRAPE SURG ORHT 6 SPLT 77X108 (DRAPES) IMPLANT
DRAPE U-SHAPE 47X51 STRL (DRAPES) ×2 IMPLANT
DRSG AQUACEL AG ADV 3.5X10 (GAUZE/BANDAGES/DRESSINGS) ×1 IMPLANT
DURAPREP 26ML APPLICATOR (WOUND CARE) ×3 IMPLANT
ELECT CAUTERY BLADE 6.4 (BLADE) ×1 IMPLANT
ELECT PENCIL ROCKER SW 15FT (MISCELLANEOUS) ×1 IMPLANT
ELECT REM PT RETURN 9FT ADLT (ELECTROSURGICAL) ×1
ELECTRODE REM PT RTRN 9FT ADLT (ELECTROSURGICAL) ×1 IMPLANT
GLOVE BIOGEL PI IND STRL 7.0 (GLOVE) ×2 IMPLANT
GLOVE BIOGEL PI IND STRL 7.5 (GLOVE) ×5 IMPLANT
GLOVE ECLIPSE 7.0 STRL STRAW (GLOVE) ×3 IMPLANT
GLOVE INDICATOR 7.0 STRL GRN (GLOVE) ×1 IMPLANT
GLOVE INDICATOR 7.5 STRL GRN (GLOVE) ×1 IMPLANT
GLOVE SURG SYN 7.5  E (GLOVE) ×2
GLOVE SURG SYN 7.5 E (GLOVE) ×2 IMPLANT
GLOVE SURG SYN 7.5 PF PI (GLOVE) ×2 IMPLANT
GLOVE SURG UNDER LTX SZ7.5 (GLOVE) ×2 IMPLANT
GLOVE SURG UNDER POLY LF SZ7 (GLOVE) ×2 IMPLANT
GOWN STRL REIN XL XLG (GOWN DISPOSABLE) ×1 IMPLANT
GOWN STRL REUS W/ TWL LRG LVL3 (GOWN DISPOSABLE) ×1 IMPLANT
GOWN STRL REUS W/TWL LRG LVL3 (GOWN DISPOSABLE) ×1
GOWN TOGA ZIPPER T7+ PEEL AWAY (MISCELLANEOUS) ×2 IMPLANT
HANDPIECE INTERPULSE COAX TIP (DISPOSABLE) ×1
HDLS TROCR DRIL PIN KNEE 75 (PIN) ×1
HOOD PEEL AWAY FLYTE STAYCOOL (MISCELLANEOUS) ×1 IMPLANT
KIT BASIN OR (CUSTOM PROCEDURE TRAY) ×1 IMPLANT
KIT TURNOVER KIT B (KITS) ×1 IMPLANT
LINER TIB PS VE EF/8-11 18 LT (Liner) IMPLANT
MANIFOLD NEPTUNE II (INSTRUMENTS) ×1 IMPLANT
MARKER SKIN DUAL TIP RULER LAB (MISCELLANEOUS) ×2 IMPLANT
NDL SPNL 18GX3.5 QUINCKE PK (NEEDLE) ×1 IMPLANT
NEEDLE SPNL 18GX3.5 QUINCKE PK (NEEDLE) ×1 IMPLANT
NS IRRIG 1000ML POUR BTL (IV SOLUTION) ×1 IMPLANT
PACK TOTAL JOINT (CUSTOM PROCEDURE TRAY) ×1 IMPLANT
PAD ARMBOARD 7.5X6 YLW CONV (MISCELLANEOUS) ×2 IMPLANT
PAD COLD SHLDR WRAP-ON (PAD) ×1 IMPLANT
PIN DRILL HDLS TROCAR 75 4PK (PIN) IMPLANT
SAW OSC TIP CART 19.5X105X1.3 (SAW) ×1 IMPLANT
SCREW FEMALE HEX FIX 25X2.5 (ORTHOPEDIC DISPOSABLE SUPPLIES) IMPLANT
SET HNDPC FAN SPRY TIP SCT (DISPOSABLE) ×1 IMPLANT
SOLUTION PRONTOSAN WOUND 350ML (IRRIGATION / IRRIGATOR) ×1 IMPLANT
STAPLER VISISTAT 35W (STAPLE) IMPLANT
STEM TIB PERS SZ E 5D LT (Screw) IMPLANT
SUCTION FRAZIER HANDLE 10FR (MISCELLANEOUS) ×1
SUCTION TUBE FRAZIER 10FR DISP (MISCELLANEOUS) ×1 IMPLANT
SUT ETHILON 2 0 FS 18 (SUTURE) IMPLANT
SUT MNCRL AB 3-0 PS2 27 (SUTURE) IMPLANT
SUT VIC AB 0 CT1 27 (SUTURE) ×2
SUT VIC AB 0 CT1 27XBRD ANBCTR (SUTURE) ×2 IMPLANT
SUT VIC AB 1 CTX 27 (SUTURE) ×3 IMPLANT
SUT VIC AB 2-0 CT1 27 (SUTURE) ×4
SUT VIC AB 2-0 CT1 TAPERPNT 27 (SUTURE) ×4 IMPLANT
SYR 50ML LL SCALE MARK (SYRINGE) ×2 IMPLANT
TOWEL GREEN STERILE (TOWEL DISPOSABLE) ×1 IMPLANT
TOWEL GREEN STERILE FF (TOWEL DISPOSABLE) ×1 IMPLANT
TRAY CATH 16FR W/PLASTIC CATH (SET/KITS/TRAYS/PACK) IMPLANT
TUBE SUCT ARGYLE STRL (TUBING) ×1 IMPLANT
UNDERPAD 30X36 HEAVY ABSORB (UNDERPADS AND DIAPERS) ×1 IMPLANT
YANKAUER SUCT BULB TIP NO VENT (SUCTIONS) ×2 IMPLANT

## 2022-11-22 NOTE — Anesthesia Procedure Notes (Signed)
Spinal  Patient location during procedure: OR Start time: 11/22/2022 7:40 AM End time: 11/22/2022 7:45 AM Reason for block: surgical anesthesia Staffing Anesthesiologist: Janeece Riggers, MD Performed by: Janeece Riggers, MD Authorized by: Janeece Riggers, MD   Preanesthetic Checklist Completed: patient identified, IV checked, site marked, risks and benefits discussed, surgical consent, monitors and equipment checked, pre-op evaluation and timeout performed Spinal Block Patient position: sitting Prep: DuraPrep Patient monitoring: heart rate, cardiac monitor, continuous pulse ox and blood pressure Approach: midline Location: L3-4 Injection technique: single-shot Needle Needle type: Sprotte  Needle gauge: 24 G Needle length: 9 cm Assessment Sensory level: T4 Events: CSF return

## 2022-11-22 NOTE — H&P (Signed)
PREOPERATIVE H&P  Chief Complaint: left knee osteoarthritis  HPI: Laura Knox is a 57 y.o. female who presents for surgical treatment of left knee osteoarthritis.  She denies any changes in medical history.  Past Medical History:  Diagnosis Date   B12 deficiency    Bilateral swelling of feet and ankles    Hypertension    Joint pain    Kidney stones    Knee pain    Osteoarthritis    Other fatigue    Pre-diabetes    Prediabetes    Sleep apnea    does not use CPAP   Thyroid disease    Vitamin D deficiency    Past Surgical History:  Procedure Laterality Date   COLONOSCOPY WITH PROPOFOL N/A 04/10/2021   Procedure: COLONOSCOPY WITH PROPOFOL;  Surgeon: Jackquline Denmark, MD;  Location: [facility] ENDOSCOPY;  Service: Endoscopy;  Laterality: N/A;   KIDNEY STONE SURGERY     miniscus repair Left    POLYPECTOMY  04/10/2021   Procedure: POLYPECTOMY;  Surgeon: Jackquline Denmark, MD;  Location: [facility] ENDOSCOPY;  Service: Endoscopy;;   SEPTOPLASTY     Social History   Socioeconomic History   Marital status: Married    Spouse name: Mitzi Hansen   Number of children: 0   Years of education: Not on file   Highest education level: Not on file  Occupational History   Occupation: Retired  Tobacco Use   Smoking status: Never   Smokeless tobacco: Never  Vaping Use   Vaping Use: Never used  Substance and Sexual Activity   Alcohol use: Not Currently   Drug use: Never   Sexual activity: Yes    Partners: Male  Other Topics Concern   Not on file  Social History Narrative   Not on file   Social Determinants of Health   Financial Resource Strain: Not on file  Food Insecurity: Not on file  Transportation Needs: Not on file  Physical Activity: Not on file  Stress: Not on file  Social Connections: Not on file   Family History  Problem Relation Age of Onset   Healthy Mother    Obesity Mother    Cancer Father    Obesity Father    Cancer - Other Father    Stroke Maternal Grandmother    Lung cancer  Maternal Grandfather    Allergies  Allergen Reactions   Oxycodone     Unmanageable constipation    Prior to Admission medications   Medication Sig Start Date End Date Taking? Authorizing Provider  ARMOUR THYROID 120 MG tablet TAKE 1 TABLET (120 MG TOTAL) BY MOUTH DAILY BEFORE BREAKFAST. 10/29/22  Yes Saguier, Percell Miller, PA-C  cetirizine (ZYRTEC) 10 MG tablet Take 10 mg by mouth daily.   Yes [provider]  estradiol (VIVELLE-DOT) 0.1 MG/24HR patch PLACE 1 PATCH (0.1 MG TOTAL) ONTO THE SKIN 2 (TWO) TIMES A WEEK. 11/11/22  Yes Saguier, Percell Miller, PA-C  hydrochlorothiazide (MICROZIDE) 12.5 MG capsule TAKE 1 CAPSULE BY MOUTH EVERY DAY 10/29/22  Yes Saguier, Percell Miller, PA-C  losartan (COZAAR) 100 MG tablet TAKE 1 TABLET BY MOUTH EVERYDAY AT BEDTIME 06/19/22  Yes Saguier, Percell Miller, PA-C  meloxicam (MOBIC) 7.5 MG tablet Take 1 tablet (7.5 mg total) by mouth daily. 09/23/22  Yes Leafy Ro, Caren D, MD  metFORMIN (GLUCOPHAGE) 500 MG tablet 2 po with lunch qd 10/14/22  Yes Beasley, Caren D, MD  methocarbamol (ROBAXIN-750) 750 MG tablet Take 1 tablet (750 mg total) by mouth 2 (two) times daily as needed for muscle  spasms. 11/12/22   Aundra Dubin, PA-C  Multiple Vitamins-Minerals (HAIR SKIN AND NAILS FORMULA) TABS Take 2 tablets by mouth daily.   Yes [provider]  Omega 3 1000 MG CAPS Take 1,000 mg by mouth 2 (two) times daily. Pro Omega   Yes [provider]  oxymetazoline (AFRIN) 0.05 % nasal spray Place 2-3 sprays into both nostrils at bedtime.   Yes [provider]  Phentermine HCl (LOMAIRA) 8 MG TABS Take 8 mg by mouth every morning. Before 1st meal of the day. 11/05/22  Yes Beasley, Caren D, MD  progesterone (PROMETRIUM) 100 MG capsule Take 1 capsule (100 mg total) by mouth at bedtime. 03/14/22  Yes Radene Gunning, MD  Simethicone (GAS-X PO) Take 1-2 tablets by mouth daily as needed (gas).   Yes [provider]  cefadroxil (DURICEF) 500 MG capsule Take 1 capsule (500 mg  total) by mouth 2 (two) times daily. To be taken after surgery 11/12/22   Aundra Dubin, PA-C  docusate sodium (COLACE) 100 MG capsule Take 1 capsule (100 mg total) by mouth daily as needed. 11/12/22 11/12/23  Aundra Dubin, PA-C  HYDROcodone-acetaminophen (NORCO) 7.5-325 MG tablet Take 1-2 tablets by mouth every 4 (four) hours as needed for moderate pain. To be taken after surgery 11/12/22   Aundra Dubin, PA-C  ondansetron (ZOFRAN) 4 MG tablet Take 1 tablet (4 mg total) by mouth every 8 (eight) hours as needed for nausea or vomiting. 11/12/22   Aundra Dubin, PA-C  rivaroxaban (XARELTO) 10 MG TABS tablet Take 1 tablet (10 mg total) by mouth daily. To be taken after surgery to prevent blood clots 11/12/22   Aundra Dubin, PA-C     Positive ROS: All other systems have been reviewed and were otherwise negative with the exception of those mentioned in the HPI and as above.  Physical Exam: General: Alert, no acute distress Cardiovascular: No pedal edema Respiratory: No cyanosis, no use of accessory musculature GI: abdomen soft Skin: No lesions in the area of chief complaint Neurologic: Sensation intact distally Psychiatric: Patient is competent for consent with normal mood and affect Lymphatic: no lymphedema  MUSCULOSKELETAL: exam stable  Assessment: left knee osteoarthritis  Plan: Plan for Procedure(s): LEFT TOTAL KNEE ARTHROPLASTY  The risks benefits and alternatives were discussed with the patient including but not limited to the risks of nonoperative treatment, versus surgical intervention including infection, bleeding, nerve injury,  blood clots, cardiopulmonary complications, morbidity, mortality, among others, and they were willing to proceed.   Eduard Roux, MD 11/22/2022 7:29 AM

## 2022-11-22 NOTE — Anesthesia Procedure Notes (Signed)
Anesthesia Regional Block: Adductor canal block   Pre-Anesthetic Checklist: , timeout performed,  Correct Patient, Correct Site, Correct Laterality,  Correct Procedure, Correct Position, site marked,  Risks and benefits discussed,  Surgical consent,  Pre-op evaluation,  At surgeon's request and post-op pain management  Laterality: Left  Prep: chloraprep       Needles:  Injection technique: Single-shot  Needle Type: Echogenic Stimulator Needle     Needle Length: 5cm  Needle Gauge: 22     Additional Needles:   Procedures:,,,, ultrasound used (permanent image in chart),,    Narrative:  Start time: 11/22/2022 7:15 AM End time: 11/22/2022 7:20 AM Injection made incrementally with aspirations every 5 mL.  Performed by: Personally  Anesthesiologist: Janeece Riggers, MD  Additional Notes: Functioning IV was confirmed and monitors were applied.  A 67mm 22ga Arrow echogenic stimulator needle was used. Sterile prep and drape,hand hygiene and sterile gloves were used. Ultrasound guidance: relevant anatomy identified, needle position confirmed, local anesthetic spread visualized around nerve(s)., vascular puncture avoided.  Image printed for medical record. Negative aspiration and negative test dose prior to incremental administration of local anesthetic. The patient tolerated the procedure well.

## 2022-11-22 NOTE — Evaluation (Signed)
Physical Therapy Evaluation Patient Details Name: Xara Paulding MRN: [id number] DOB: 17-Dec-1965 Today's Date: 11/22/2022  History of Present Illness  57 y.o. female presents to [facility] hospital on 11/22/2022 for elective L TKA. PMH includes HTN, OA, OSA, thyroid disease.  Clinical Impression  Pt presents to PT with deficits in strength, power, ROM, gait, balance, endurance. Pt is able to transfer and ambulate without physical assistance from PT. Pt does require some assistance to mobilize LLE at bed level, however PT anticipates this will improve with further opportunities to mobilize. Pt will benefit from aggressive mobilization and HEP in an effort to improve strength and to reduce pain. PT recommends discharge home with HHPT and PRN assistance of caregivers.       Recommendations for follow up therapy are one component of a multi-disciplinary discharge planning process, led by the attending physician.  Recommendations may be updated based on patient status, additional functional criteria and insurance authorization.  Follow Up Recommendations Home health PT      Assistance Recommended at Discharge PRN  Patient can return home with the following  A little help with bathing/dressing/bathroom;Assistance with cooking/housework;Help with stairs or ramp for entrance;Assist for transportation    Equipment Recommendations None recommended by PT (pt owns necessary DME)  Recommendations for Other Services       Functional Status Assessment Patient has had a recent decline in their functional status and demonstrates the ability to make significant improvements in function in a reasonable and predictable amount of time.     Precautions / Restrictions Precautions Precautions: Fall;Knee Precaution Booklet Issued: Yes (comment) Restrictions Weight Bearing Restrictions: Yes LLE Weight Bearing: Weight bearing as tolerated      Mobility  Bed Mobility Overal bed mobility: Needs Assistance Bed  Mobility: Supine to Sit     Supine to sit: Min assist     General bed mobility comments: assist for LLE    Transfers Overall transfer level: Needs assistance Equipment used: Rolling walker (2 wheels) Transfers: Sit to/from Stand Sit to Stand: Min guard                Ambulation/Gait Ambulation/Gait assistance: Min guard Gait Distance (Feet): 70 Feet Assistive device: Rolling walker (2 wheels) Gait Pattern/deviations: Step-to pattern Gait velocity: reduced Gait velocity interpretation: <1.31 ft/sec, indicative of household ambulator   General Gait Details: slowed step-to gait, out-toeing on LLE during swing and stance phase  Stairs            Wheelchair Mobility    Modified Rankin (Stroke Patients Only)       Balance Overall balance assessment: Needs assistance Sitting-balance support: No upper extremity supported, Feet supported Sitting balance-Leahy Scale: Good     Standing balance support: Single extremity supported, Reliant on assistive device for balance Standing balance-Leahy Scale: Poor                               Pertinent Vitals/Pain Pain Assessment Pain Assessment: 0-10 Pain Score: 8  Pain Location: L knee Pain Descriptors / Indicators: Burning, Throbbing Pain Intervention(s): Limited activity within patient's tolerance    Home Living Family/patient expects to be discharged to:: Private residence Living Arrangements: Alone Available Help at Discharge: Family;Available 24 hours/day (mother) Type of Home: House Home Access: Level entry       Home Layout: One level Home Equipment: Conservation officer, nature (2 wheels);BSC/3in1;Shower seat      Prior Function Prior Level of Function : Independent/Modified Independent;Driving  Mobility Comments: mobilizing independently, retired from [employer]        Extremity/Trunk Assessment   Upper Extremity Assessment Upper Extremity Assessment:  Overall WFL for tasks assessed    Lower Extremity Assessment Lower Extremity Assessment: LLE deficits/detail LLE Deficits / Details: ankle PF/DF 4-/5, knee extension 3/5 through available range, knee flexion 3-/5 LLE Sensation: decreased light touch    Cervical / Trunk Assessment Cervical / Trunk Assessment: Other exceptions Cervical / Trunk Exceptions: body habitus  Communication   Communication: No difficulties  Cognition Arousal/Alertness: Awake/alert Behavior During Therapy: WFL for tasks assessed/performed Overall Cognitive Status: Within Functional Limits for tasks assessed                                          General Comments General comments (skin integrity, edema, etc.): VSS on RA, intermittent nausea when ambulating    Exercises     Assessment/Plan    PT Assessment Patient needs continued PT services  PT Problem List Decreased strength;Decreased range of motion;Decreased activity tolerance;Decreased balance;Decreased mobility;Decreased knowledge of use of DME;Decreased safety awareness;Decreased knowledge of precautions;Pain       PT Treatment Interventions DME instruction;Gait training;Functional mobility training;Therapeutic activities;Therapeutic exercise;Balance training;Neuromuscular re-education;Cognitive remediation;Patient/family education    PT Goals (Current goals can be found in the Care Plan section)  Acute Rehab PT Goals Patient Stated Goal: to return to independence PT Goal Formulation: With patient/family Time For Goal Achievement: 11/27/22 Potential to Achieve Goals: Good    Frequency 7X/week     Co-evaluation               AM-PAC PT "6 Clicks" Mobility  Outcome Measure Help needed turning from your back to your side while in a flat bed without using bedrails?: A Little Help needed moving from lying on your back to sitting on the side of a flat bed without using bedrails?: A Little Help needed moving to and from  a bed to a chair (including a wheelchair)?: A Little Help needed standing up from a chair using your arms (e.g., wheelchair or bedside chair)?: A Little Help needed to walk in hospital room?: A Little Help needed climbing 3-5 steps with a railing? : Total 6 Click Score: 16    End of Session   Activity Tolerance: Patient tolerated treatment well Patient left: in chair;with call bell/phone within reach;with family/visitor present Nurse Communication: Mobility status PT Visit Diagnosis: Other abnormalities of gait and mobility (R26.89);Pain Pain - Right/Left: Left Pain - part of body: Knee    Time: 5277-8242 PT Time Calculation (min) (ACUTE ONLY): 34 min   Charges:   PT Evaluation $PT Eval Low Complexity: 1 Low          Zenaida Niece, PT, DPT Acute Rehabilitation Office [phone]   Zenaida Niece 11/22/2022, 2:26 PM

## 2022-11-22 NOTE — Discharge Instructions (Signed)

## 2022-11-22 NOTE — Op Note (Addendum)
Total Knee Arthroplasty Procedure Note  Preoperative diagnosis: Left knee osteoarthritis  Postoperative diagnosis:same  Operative findings: Varus deformity, significant wear of medial tibial plateau Good bone quality  Operative procedure: Left total knee arthroplasty. CPT [phone]  Surgeon: N. Eduard Roux, MD  Assist: Madalyn Rob, PA-C; necessary for the timely completion of procedure and due to complexity of procedure.  Anesthesia: Spinal, regional  Tourniquet time: see anesthesia record  Implants used: Zimmer persona uncemented Femur: CR 8 Tibia: E Patella: 35 mm Polyethylene: 18 mm, MC  Indication: Laura Knox is a 57 y.o. year old female with a history of knee pain. Having failed conservative management, the patient elected to proceed with a total knee arthroplasty.  We have reviewed the risk and benefits of the surgery and they elected to proceed after voicing understanding.  Procedure:  After informed consent was obtained and understanding of the risk were voiced including but not limited to bleeding, infection, damage to surrounding structures including nerves and vessels, blood clots, leg length inequality and the failure to achieve desired results, the operative extremity was marked with verbal confirmation of the patient in the holding area.   The patient was then brought to the operating room and transported to the operating room table in the supine position.  A tourniquet was applied to the operative extremity around the upper thigh. The operative limb was then prepped and draped in the usual sterile fashion and preoperative antibiotics were administered.  A time out was performed prior to the start of surgery confirming the correct extremity, preoperative antibiotic administration, as well as team members, implants and instruments available for the case. Correct surgical site was also confirmed with preoperative radiographs. The limb was then elevated for  exsanguination and the tourniquet was inflated. A midline incision was made and a standard medial parapatellar approach was performed.  The patella was prepared and sized to a 35 mm.  A cover was placed on the patella for protection from retractors.  We then turned our attention to the femur. Posterior cruciate ligament was sacrificed. Start site was drilled in the femur and the intramedullary distal femoral cutting guide was placed, set at 3 degrees valgus, taking 10 mm of distal resection. The distal cut was made. Osteophytes were then removed.   Next, the proximal tibial cutting guide was placed with appropriate slope, varus/valgus alignment and depth of resection. The proximal tibial cut was made taking 2 mm off the low side. Gap blocks were then used to assess the extension gap and alignment, and appropriate soft tissue releases were required to achieve balanced extension space. Attention was turned back to the femur, which was sized using the sizing guide to a size 8. Appropriate rotation of the femoral component was determined using epicondylar axis, Whiteside's line, and assessing the flexion gap under ligament tension. The appropriate size 4-in-1 cutting block was placed and cuts were made.  Posterior femoral osteophytes and uncapped bone were then removed with the curved osteotome.  Trial components were placed, and stability was checked in full extension, mid-flexion, and deep flexion. Proper tibial rotation was determined and marked.  The patella tracked well without a lateral release. Trial components were then removed and tibial preparation performed.  The tibia was sized for a size E component.  The bony surfaces were irrigated with a pulse lavage and then dried. Final components were placed.  The stability of the construct was re-evaluated throughout a range of motion and found to be acceptable. The trial liner  was removed, the knee was copiously irrigated, and the knee was re-evaluated for any  excess bone debris. The real polyethylene liner, 18 mm thick, was inserted and checked to ensure the locking mechanism had engaged appropriately. The tourniquet was deflated and hemostasis was achieved. The wound was irrigated with normal saline.  One gram of vancomycin powder was placed in the surgical bed.  Topical 0.25% bupivacaine and meloxicam was placed in the joint for postoperative pain.  Capsular closure was performed with a #1 vicryl, subcutaneous fat closed with a 0 vicryl suture, then subcutaneous tissue closed with interrupted 2.0 vicryl suture. The skin was then closed with a 2.0 nylon and dermabond. A sterile dressing was applied.  The patient was awakened in the operating room and taken to recovery in stable condition. All sponge, needle, and instrument counts were correct at the end of the case.  Laura Knox was necessary for opening, closing, retracting, limb positioning and overall facilitation and completion of the surgery.  Position: supine  Complications: none.  Time Out: performed   Drains/Packing: none  Estimated blood loss: minimal  Returned to Recovery Room: in good condition.   Antibiotics: yes   Mechanical VTE (DVT) Prophylaxis: sequential compression devices, TED thigh-high  Chemical VTE (DVT) Prophylaxis: xarelto POD 1  Fluid Replacement  Crystalloid: see anesthesia record Blood: none  FFP: none   Specimens Removed: 1 to pathology   Sponge and Instrument Count Correct? yes   PACU: portable radiograph - knee AP and Lateral   Plan/RTC: Return in 2 weeks for wound check.   Weight Bearing/Load Lower Extremity: full   Implant Name Type Inv. Item Serial No. Manufacturer Lot No. LRB No. Used Action  STEM TIB PERS SZ E 5D LT - [id number] Screw STEM TIB PERS SZ E 5D LT  ZIMMER RECON(ORTH,TRAU,BIO,SG) [id number] Left 1 Implanted  COMP FEM KNEE STD PS 8 LT - [id number] Joint COMP FEM KNEE STD PS 8 LT  ZIMMER RECON(ORTH,TRAU,BIO,SG) [id number] Left 1 Implanted   COMP PATELLAR 10X35 METAL - [id number] Joint COMP PATELLAR 10X35 METAL  ZIMMER RECON(ORTH,TRAU,BIO,SG) [id number] Left 1 Implanted  Persona Personalized Knee System Vivacit E Left 18 mm Height Medial Congruent Articular Surface    ZIMMER KNEE [id number] Left 1 Implanted    N. Eduard Roux, MD [facility] 9:12 AM

## 2022-11-22 NOTE — Transfer of Care (Signed)
Immediate Anesthesia Transfer of Care Note  Patient: Laura Knox  Procedure(s) Performed: LEFT TOTAL KNEE REPLACEMENT (Left: Knee)  Patient Location: PACU  Anesthesia Type:General, Regional, and Spinal  Level of Consciousness: awake and alert   Airway & Oxygen Therapy: Patient Spontanous Breathing and Patient connected to nasal cannula oxygen  Post-op Assessment: Report given to RN and Post -op Vital signs reviewed and stable  Post vital signs: Reviewed and stable  Last Vitals:  Vitals Value Taken Time  BP 102/45 11/22/22 0955  Temp    Pulse 69 11/22/22 1000  Resp 16 11/22/22 1000  SpO2 94 % 11/22/22 1000  Vitals shown include unvalidated device data.  Last Pain:  Vitals:   11/22/22 0548  PainSc: 1          Complications: No notable events documented.

## 2022-11-22 NOTE — TOC Initial Note (Signed)
Transition of Care [facility]) - Initial/Assessment Note    Patient Details  Name: Laura Knox MRN: [id number] Date of Birth: 10/26/1966  Transition of Care [facility]) CM/SW Contact:    Pollie Friar, RN Phone Number: 11/22/2022, 4:11 PM  Clinical Narrative:                 Pt will discharge home when medically ready with home health services through [facility]. Information on the AVS.  Any needed DME will be obtained by bedside RN.  Pt has transportation home when discharged.  Expected Discharge Plan: [place] Barriers to Discharge: Continued Medical Work up   Patient Goals and CMS Choice   CMS Medicare.gov Compare Post Acute Care list provided to:: Patient Choice offered to / list presented to : Patient      Expected Discharge Plan and Services   Discharge Planning Services: CM Consult Post Acute Care Choice: [place] arrangements for the past 2 months: Single Family Home                           HH Arranged: PT, OT HH Agency: [facility] Date [place]: 11/22/22   Representative spoke with at [place]: Hoyle Sauer  Prior Living Arrangements/Services Living arrangements for the past 2 months: [place] Lives with:: Spouse Patient language and need for interpreter reviewed:: Yes Do you feel safe going back to the place where you live?: Yes      Need for Family Participation in Patient Care: Yes (Comment) Care giver support system in place?: Yes (comment)   Criminal Activity/Legal Involvement Pertinent to Current Situation/Hospitalization: No - Comment as needed  Activities of Daily Living      Permission Sought/Granted                  Emotional Assessment Appearance:: Appears stated age Attitude/Demeanor/Rapport: Engaged Affect (typically observed): Accepting Orientation: : Oriented to Self, Oriented to Place, Oriented to  Time, Oriented to Situation   Psych Involvement: No (comment)  Admission  diagnosis:  OA (osteoarthritis) of knee [M17.9] Status post total left knee replacement [Z96.652] Patient Active Problem List   Diagnosis Date Noted   OA (osteoarthritis) of knee 11/22/2022   Status post total left knee replacement 11/22/2022   Primary osteoarthritis of left knee 11/21/2022   Left knee pain 10/14/2022   Osteoarthritis of both knees 07/22/2022   Achilles tendinitis 03/21/2022   Pre-diabetes 02/27/2022   Vitamin D deficiency 02/27/2022   At risk for diabetes mellitus 02/27/2022   Plantar fasciitis of right foot 01/08/2022   Special screening for malignant neoplasms, colon    Rectal polyp    Essential hypertension 01/29/2021   Class 3 severe obesity with serious comorbidity and body mass index (BMI) of 60.0 to 69.9 in adult ([place]) 01/29/2021   Decreased functional activity tolerance 07/24/2020   Cervical spondylosis 07/24/2020   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   Obstructive sleep apnea syndrome 07/20/2015   Arthritis 01/06/2014   PCP:  Mackie Pai, PA-C Pharmacy:   [id number] [place], [place] - [address] [place] [address] Phone: [phone] Fax: [phone]  [facility] [place] [address] Phone: [phone] Fax: [phone]     Social Determinants of Health (SDOH) Social History: SDOH Screenings   Depression (PHQ2-9): Medium Risk (12/27/2020)  Tobacco Use: Low  Risk  (11/22/2022)   SDOH Interventions:     Readmission Risk Interventions     No data to display

## 2022-11-22 NOTE — Anesthesia Postprocedure Evaluation (Signed)
Anesthesia Post Note  Patient: Laura Knox  Procedure(s) Performed: LEFT TOTAL KNEE REPLACEMENT (Left: Knee)     Patient location during evaluation: PACU Anesthesia Type: Regional and General Level of consciousness: awake and alert Pain management: pain level controlled Vital Signs Assessment: post-procedure vital signs reviewed and stable Respiratory status: spontaneous breathing, nonlabored ventilation, respiratory function stable and patient connected to nasal cannula oxygen Cardiovascular status: blood pressure returned to baseline and stable Postop Assessment: no apparent nausea or vomiting Anesthetic complications: no  No notable events documented.  Last Vitals:  Vitals:   11/22/22 1030 11/22/22 1100  BP: 93/74 128/73  Pulse: (!) 57 (!) 55  Resp: 19 15  Temp:    SpO2: 99% 100%    Last Pain:  Vitals:   11/22/22 1110  PainSc: 8                  Ryanne Morand

## 2022-11-23 DIAGNOSIS — M1712 Unilateral primary osteoarthritis, left knee: Secondary | ICD-10-CM | POA: Diagnosis not present

## 2022-11-23 DIAGNOSIS — Z7984 Long term (current) use of oral hypoglycemic drugs: Secondary | ICD-10-CM | POA: Diagnosis not present

## 2022-11-23 DIAGNOSIS — Z96652 Presence of left artificial knee joint: Secondary | ICD-10-CM | POA: Diagnosis not present

## 2022-11-23 DIAGNOSIS — Z7901 Long term (current) use of anticoagulants: Secondary | ICD-10-CM | POA: Diagnosis not present

## 2022-11-23 DIAGNOSIS — Z79899 Other long term (current) drug therapy: Secondary | ICD-10-CM | POA: Diagnosis not present

## 2022-11-23 DIAGNOSIS — I10 Essential (primary) hypertension: Secondary | ICD-10-CM | POA: Diagnosis not present

## 2022-11-23 LAB — CBC
HCT: 38.2 % (ref 36.0–46.0)
Hemoglobin: 12.9 g/dL (ref 12.0–15.0)
MCH: 29.1 pg (ref 26.0–34.0)
MCHC: 33.8 g/dL (ref 30.0–36.0)
MCV: 86.2 fL (ref 80.0–100.0)
Platelets: 289 10*3/uL (ref 150–400)
RBC: 4.43 MIL/uL (ref 3.87–5.11)
RDW: 12.6 % (ref 11.5–15.5)
WBC: 13.7 10*3/uL — ABNORMAL HIGH (ref 4.0–10.5)
nRBC: 0 % (ref 0.0–0.2)

## 2022-11-23 MED ORDER — HYDROMORPHONE HCL 2 MG PO TABS: 2.0000 mg | ORAL_TABLET | Freq: Two times a day (BID) | ORAL | 0 refills | Status: DC | PRN

## 2022-11-23 NOTE — Evaluation (Signed)
Occupational Therapy Evaluation Patient Details Name: Laura Knox MRN: [id number] DOB: May 26, 1966 Today's Date: 11/23/2022   History of Present Illness 57 y.o. female presents to [facility] hospital on 11/22/2022 for elective L TKA. PMH includes HTN, OA, OSA, thyroid disease.   Clinical Impression   Prior to this admission, patient retired, and independent and driving. Currently, patient presenting with decreased activity tolerance, and pain in L knee. Patient min guard for ADLs, with increased emphasis placed on toilet transfers, and education with regard to lower body bathing and dressing, and car transfers. Patient able to perform toilet transfer at min guard level. Patient requiring no further acute OT services or OT services at discharge; OT will sign off.      Recommendations for follow up therapy are one component of a multi-disciplinary discharge planning process, led by the attending physician.  Recommendations may be updated based on patient status, additional functional criteria and insurance authorization.   Follow Up Recommendations  No OT follow up     Assistance Recommended at Discharge Set up Supervision/Assistance  Patient can return home with the following A little help with bathing/dressing/bathroom;Assistance with cooking/housework;Assist for transportation    Functional Status Assessment  Patient has had a recent decline in their functional status and demonstrates the ability to make significant improvements in function in a reasonable and predictable amount of time.  Equipment Recommendations  None recommended by OT (Patient has DME needed)    Recommendations for Other Services       Precautions / Restrictions Precautions Precautions: Fall;Knee Precaution Booklet Issued: No Precaution Comments: Patient provided handout in PT eval Restrictions Weight Bearing Restrictions: Yes LLE Weight Bearing: Weight bearing as tolerated      Mobility Bed Mobility Overal bed  mobility: Modified Independent                  Transfers Overall transfer level: Modified independent                        Balance Overall balance assessment: Mild deficits observed, not formally tested   Sitting balance-Leahy Scale: Normal     Standing balance support: No upper extremity supported, Bilateral upper extremity supported Standing balance-Leahy Scale: Fair                             ADL either performed or assessed with clinical judgement   ADL Overall ADL's : Needs assistance/impaired Eating/Feeding: Set up;Sitting   Grooming: Set up;Sitting   Upper Body Bathing: Set up;Sitting   Lower Body Bathing: Minimal assistance;Sitting/lateral leans;Sit to/from stand Lower Body Bathing Details (indicate cue type and reason): educated with regard to long handled sponge to promote independence Upper Body Dressing : Set up;Sitting   Lower Body Dressing: Minimal assistance;Sitting/lateral leans;Sit to/from stand   Toilet Transfer: Min guard;Ambulation;Regular Toilet;Grab bars;Rolling walker (2 wheels) Toilet Transfer Details (indicate cue type and reason): practicing toilet transfer, patient endorsing need for grab bar near toilet, which step father can install Toileting- Clothing Manipulation and Hygiene: Min guard;Sitting/lateral lean;Sit to/from stand       Functional mobility during ADLs: Min guard;Rolling walker (2 wheels);Cueing for sequencing General ADL Comments: Patient presenting with decreased activity tolerance, and pain in L knee.     Vision Baseline Vision/History: 1 Wears glasses Ability to See in Adequate Light: 0 Adequate Patient Visual Report: No change from baseline Vision Assessment?: No apparent visual deficits     Perception  Praxis      Pertinent Vitals/Pain Pain Assessment Pain Assessment: 0-10 Pain Score: 2  Pain Location: L knee Pain Descriptors / Indicators: Sore Pain Intervention(s): Limited  activity within patient's tolerance, Monitored during session, Repositioned     Hand Dominance     Extremity/Trunk Assessment Upper Extremity Assessment Upper Extremity Assessment: Overall WFL for tasks assessed   Lower Extremity Assessment Lower Extremity Assessment: Defer to PT evaluation   Cervical / Trunk Assessment Cervical / Trunk Assessment: Other exceptions Cervical / Trunk Exceptions: body habitus   Communication Communication Communication: No difficulties   Cognition Arousal/Alertness: Awake/alert Behavior During Therapy: WFL for tasks assessed/performed Overall Cognitive Status: Within Functional Limits for tasks assessed                                       General Comments       Exercises     Shoulder Instructions      Home Living Family/patient expects to be discharged to:: Private residence Living Arrangements: Alone Available Help at Discharge: Family;Available 24 hours/day (mother) Type of Home: House Home Access: Level entry     Home Layout: One level     Bathroom Shower/Tub: Occupational psychologist: Standard     Home Equipment: Conservation officer, nature (2 wheels);BSC/3in1;Shower seat;Grab bars - tub/shower          Prior Functioning/Environment Prior Level of Function : Independent/Modified Independent;Driving             Mobility Comments: mobilizing independently, retired from [employer] ADLs Comments: independent with extra time        OT Problem List: Decreased strength;Decreased range of motion;Decreased activity tolerance;Impaired balance (sitting and/or standing);Pain      OT Treatment/Interventions:      OT Goals(Current goals can be found in the care plan section) Acute Rehab OT Goals Patient Stated Goal: to get rehab started so I can get my other knee done ASAP OT Goal Formulation: With patient Time For Goal Achievement: 12/07/22 Potential to Achieve Goals: Good  OT Frequency:       Co-evaluation              AM-PAC OT "6 Clicks" Daily Activity     Outcome Measure Help from another person eating meals?: None Help from another person taking care of personal grooming?: None Help from another person toileting, which includes using toliet, bedpan, or urinal?: A Little Help from another person bathing (including washing, rinsing, drying)?: A Little Help from another person to put on and taking off regular upper body clothing?: None Help from another person to put on and taking off regular lower body clothing?: A Little 6 Click Score: 21   End of Session Equipment Utilized During Treatment: Rolling walker (2 wheels) Nurse Communication: Mobility status  Activity Tolerance: Patient tolerated treatment well Patient left: in bed;with call bell/phone within reach  OT Visit Diagnosis: Unsteadiness on feet (R26.81);Pain;Other abnormalities of gait and mobility (R26.89) Pain - Right/Left: Left Pain - part of body: Knee                Time: 3818-2993 OT Time Calculation (min): 21 min Charges:  OT General Charges $OT Visit: 1 Visit OT Evaluation $OT Eval Moderate Complexity: 1 Mod  Corinne Ports E. Emer Onnen, OTR/L Acute Rehabilitation Services [phone]   Ascencion Dike 11/23/2022, 9:10 AM

## 2022-11-23 NOTE — TOC Transition Note (Signed)
Transition of Care [facility]) - CM/SW Discharge Note   Patient Details  Name: Laura Knox MRN: [id number] Date of Birth: 1966-04-11  Transition of Care [facility] CM/SW Contact:  Carles Collet, RN Phone Number: 11/23/2022, 10:00 AM   Clinical Narrative:     Notified [facility] HH that patient will DC today. No new TOC needs identified for DC    Barriers to Discharge: Continued Medical Work up   Patient Goals and CMS Choice CMS Medicare.gov Compare Post Acute Care list provided to:: Patient Choice offered to / list presented to : Patient  Discharge Placement                         Discharge Plan and Services Additional resources added to the After Visit Summary for     Discharge Planning Services: CM Consult Post Acute Care Choice: Home Health                    HH Arranged: PT, OT [facility] Agency: [place] Date [place]: 11/22/22   Representative spoke with at [place]: [place] (SDOH) Interventions SDOH Screenings   Depression (PHQ2-9): Medium Risk (12/27/2020)  Tobacco Use: Low Risk  (11/22/2022)     Readmission Risk Interventions     No data to display

## 2022-11-23 NOTE — Progress Notes (Signed)
Shalyn is doing great.  Passed PT.  Feels comfortable going home with HHPT. Instructions reviewed.

## 2022-11-23 NOTE — Discharge Summary (Signed)
Patient ID: Laura Knox MRN: [id number] DOB/AGE: 1966/03/22 57 y.o.  Admit date: 11/22/2022 Discharge date: 11/23/2022  Admission Diagnoses:  Primary osteoarthritis of left knee  Discharge Diagnoses:  Principal Problem:   Primary osteoarthritis of left knee Active Problems:   OA (osteoarthritis) of knee   Status post total left knee replacement   Past Medical History:  Diagnosis Date   B12 deficiency    Bilateral swelling of feet and ankles    Hypertension    Joint pain    Kidney stones    Knee pain    Osteoarthritis    Other fatigue    Pre-diabetes    Prediabetes    Sleep apnea    does not use CPAP   Thyroid disease    Vitamin D deficiency     Surgeries: Procedure(s): LEFT TOTAL KNEE REPLACEMENT on 11/22/2022   Consultants (if any):   Discharged Condition: Improved  Hospital Course: Laura Knox is an 57 y.o. female who was admitted 11/22/2022 with a diagnosis of Primary osteoarthritis of left knee and went to the operating room on 11/22/2022 and underwent the above named procedures.    She was given perioperative antibiotics:  Anti-infectives (From admission, onward)    Start     Dose/Rate Route Frequency Ordered Stop   11/23/22 1000  cefadroxil (DURICEF) capsule 500 mg       Note to Pharmacy: To be taken after surgery     500 mg Oral 2 times daily 11/22/22 1147     11/22/22 1400  ceFAZolin (ANCEF) IVPB 2g/100 mL premix        2 g 200 mL/hr over 30 Minutes Intravenous Every 6 hours 11/22/22 1146 11/22/22 1921   11/22/22 0813  vancomycin (VANCOCIN) powder  Status:  Discontinued          As needed 11/22/22 0813 11/22/22 0949   11/22/22 0600  ceFAZolin (ANCEF) IVPB 2g/100 mL premix        2 g 200 mL/hr over 30 Minutes Intravenous On call to O.R. 11/22/22 9323 11/22/22 0817     .  She was given sequential compression devices, early ambulation, and appropriate chemoprophylaxis for DVT prophylaxis.  She benefited maximally from the hospital stay and there  were no complications.    Recent vital signs:  Vitals:   11/22/22 2302 11/23/22 0541  BP: 121/72 (!) 150/82  Pulse: 74 (!) 57  Resp: 18 20  Temp: 98.1 F (36.7 C) 98.2 F (36.8 C)  SpO2: 95% 100%    Recent laboratory studies:  Lab Results  Component Value Date   HGB 12.9 11/23/2022   HGB 14.9 11/13/2022   HGB 13.8 02/06/2021   Lab Results  Component Value Date   WBC 13.7 (H) 11/23/2022   PLT 289 11/23/2022   No results found for: "INR" Lab Results  Component Value Date   NA 137 11/13/2022   K 3.4 (L) 11/13/2022   CL 104 11/13/2022   CO2 25 11/13/2022   BUN 17 11/13/2022   CREATININE 0.65 11/13/2022   GLUCOSE 97 11/13/2022    Discharge Medications:   Allergies as of 11/23/2022       Reactions   Oxycodone    Unmanageable constipation         Medication List     STOP taking these medications    meloxicam 7.5 MG tablet Commonly known as: MOBIC   Omega 3 1000 MG Caps       TAKE these medications    Armour  Thyroid 120 MG tablet Generic drug: thyroid TAKE 1 TABLET (120 MG TOTAL) BY MOUTH DAILY BEFORE BREAKFAST.   cefadroxil 500 MG capsule Commonly known as: DURICEF Take 1 capsule (500 mg total) by mouth 2 (two) times daily. To be taken after surgery   cetirizine 10 MG tablet Commonly known as: ZYRTEC Take 10 mg by mouth daily.   docusate sodium 100 MG capsule Commonly known as: Colace Take 1 capsule (100 mg total) by mouth daily as needed.   estradiol 0.1 MG/24HR patch Commonly known as: VIVELLE-DOT PLACE 1 PATCH (0.1 MG TOTAL) ONTO THE SKIN 2 (TWO) TIMES A WEEK.   GAS-X PO Take 1-2 tablets by mouth daily as needed (gas).   Hair Skin and Nails Formula Tabs Take 2 tablets by mouth daily.   hydrochlorothiazide 12.5 MG capsule Commonly known as: MICROZIDE TAKE 1 CAPSULE BY MOUTH EVERY DAY   HYDROcodone-acetaminophen 7.5-325 MG tablet Commonly known as: Norco Take 1-2 tablets by mouth every 4 (four) hours as needed for moderate pain.  To be taken after surgery   HYDROmorphone 2 MG tablet Commonly known as: Dilaudid Take 1 tablet (2 mg total) by mouth 2 (two) times daily as needed for severe pain.   Lomaira 8 MG Tabs Generic drug: Phentermine HCl Take 8 mg by mouth every morning. Before 1st meal of the day.   losartan 100 MG tablet Commonly known as: COZAAR TAKE 1 TABLET BY MOUTH EVERYDAY AT BEDTIME   metFORMIN 500 MG tablet Commonly known as: GLUCOPHAGE 2 po with lunch qd   methocarbamol 750 MG tablet Commonly known as: Robaxin-750 Take 1 tablet (750 mg total) by mouth 2 (two) times daily as needed for muscle spasms.   ondansetron 4 MG tablet Commonly known as: Zofran Take 1 tablet (4 mg total) by mouth every 8 (eight) hours as needed for nausea or vomiting.   oxymetazoline 0.05 % nasal spray Commonly known as: AFRIN Place 2-3 sprays into both nostrils at bedtime.   progesterone 100 MG capsule Commonly known as: Prometrium Take 1 capsule (100 mg total) by mouth at bedtime.   rivaroxaban 10 MG Tabs tablet Commonly known as: XARELTO Take 1 tablet (10 mg total) by mouth daily. To be taken after surgery to prevent blood clots               Durable Medical Equipment  (From admission, onward)           Start     Ordered   11/22/22 1147  DME Walker rolling  Once       Question Answer Comment  Walker: With [place]   Patient needs a walker to treat with the following condition Status post left partial knee replacement      11/22/22 1146   11/22/22 1147  DME 3 n 1  Once        11/22/22 1146   11/22/22 1147  DME Bedside commode  Once       Question:  Patient needs a bedside commode to treat with the following condition  Answer:  Status post left partial knee replacement   11/22/22 1146            Diagnostic Studies: DG Knee Left Port  Result Date: 11/22/2022 CLINICAL DATA:  Status post left knee replacement EXAM: PORTABLE LEFT KNEE - 2 VIEW COMPARISON:  Left knee radiographs  dated 10/04/2022 FINDINGS: Postsurgical changes from left knee arthroplasty. Hardware components appear well seated and intact. Subcutaneous edema and emphysema, likely postsurgical.  IMPRESSION: Postsurgical changes from left knee arthroplasty. Electronically Signed   By: Agustin Cree M.D.   On: 11/22/2022 10:21    Disposition: Discharge disposition: 01-Home or Self Care       Discharge Instructions     Call MD / Call 911   Complete by: As directed    If you experience chest pain or shortness of breath, CALL 911 and be transported to the hospital emergency room.  If you develope a fever above 101.5 F, pus (white drainage) or increased drainage or redness at the wound, or calf pain, call your surgeon's office.   Constipation Prevention   Complete by: As directed    Drink plenty of fluids.  Prune juice may be helpful.  You may use a stool softener, such as Colace (over the counter) 100 mg twice a day.  Use MiraLax (over the counter) for constipation as needed.   Driving restrictions   Complete by: As directed    No driving while taking narcotic pain meds.   Increase activity slowly as tolerated   Complete by: As directed    Post-operative opioid taper instructions:   Complete by: As directed    POST-OPERATIVE OPIOID TAPER INSTRUCTIONS: It is important to wean off of your opioid medication as soon as possible. If you do not need pain medication after your surgery it is ok to stop day one. Opioids include: Codeine, Hydrocodone(Norco, Vicodin), Oxycodone(Percocet, oxycontin) and hydromorphone amongst others.  Long term and even short term use of opiods can cause: Increased pain response Dependence Constipation Depression Respiratory depression And more.  Withdrawal symptoms can include Flu like symptoms Nausea, vomiting And more Techniques to manage these symptoms Hydrate well Eat regular healthy meals Stay active Use relaxation techniques(deep breathing, meditating, yoga) Do Not  substitute Alcohol to help with tapering If you have been on opioids for less than two weeks and do not have pain than it is ok to stop all together.  Plan to wean off of opioids This plan should start within one week post op of your joint replacement. Maintain the same interval or time between taking each dose and first decrease the dose.  Cut the total daily intake of opioids by one tablet each day Next start to increase the time between doses. The last dose that should be eliminated is the evening dose.           Follow-up Information     Tarry Kos, MD. Schedule an appointment as soon as possible for a visit in 2 week(s).   Specialty: Orthopedic Surgery Contact information: [address] [place] [address] [phone]         Home, Medi Follow up.   Why: The home health agency will contact you for the first home visit. Contact information: [address] [place] [address] [phone]                  Signed: Glee Arvin 11/23/2022, 9:49 AM

## 2022-11-23 NOTE — Progress Notes (Signed)
Physical Therapy Treatment Patient Details Name: Laura Knox MRN: [id number] DOB: 06-[name]-1966 Today's Date: 11/23/2022   History of Present Illness 57 y.o. female admitted 11/22/2022 for elective L TKA. PMH includes HTN, OA, OSA, thyroid disease.    PT Comments    Pt very pleasant, highly motivated and demonstrating increased mobility, gait and strength this session. Pt educated for HEP, gait improvement to decrease circumduction and transfers. Pt will have assist of parents at home and does not have stairs to enter. Pt's mobility is safe for D/C.     Recommendations for follow up therapy are one component of a multi-disciplinary discharge planning process, led by the attending physician.  Recommendations may be updated based on patient status, additional functional criteria and insurance authorization.  Follow Up Recommendations  Home health PT     Assistance Recommended at Discharge PRN  Patient can return home with the following A little help with bathing/dressing/bathroom;Assistance with cooking/housework;Assist for transportation   Equipment Recommendations  None recommended by PT    Recommendations for Other Services       Precautions / Restrictions Precautions Precautions: Fall;Knee Restrictions LLE Weight Bearing: Weight bearing as tolerated     Mobility  Bed Mobility Overal bed mobility: Modified Independent             General bed mobility comments: Pt with elevated bed and HOB 50 degrees to transition supine<>sit. has adjustable bed at home and leaves HOB up for mobility    Transfers Overall transfer level: Modified independent                      Ambulation/Gait Ambulation/Gait assistance: Supervision Gait Distance (Feet): 300 Feet Assistive device: Rolling walker (2 wheels) Gait Pattern/deviations: Step-through pattern, Decreased stride length   Gait velocity interpretation: 1.31 - 2.62 ft/sec, indicative of limited community ambulator    General Gait Details: cues to decrease circumduction on LLE and increase knee flexion for swing. reliance on RW and able to increase gait distance with pt self-regulating activity tolerance   Stairs             Wheelchair Mobility    Modified Rankin (Stroke Patients Only)       Balance Overall balance assessment: Mild deficits observed, not formally tested   Sitting balance-Leahy Scale: Normal     Standing balance support: No upper extremity supported, Bilateral upper extremity supported Standing balance-Leahy Scale: Fair                              Cognition Arousal/Alertness: Awake/alert Behavior During Therapy: WFL for tasks assessed/performed Overall Cognitive Status: Within Functional Limits for tasks assessed                                          Exercises Total Joint Exercises Heel Slides: AAROM, Left, Supine, 10 reps Hip ABduction/ADduction: AROM, Left, Supine, 10 reps Straight Leg Raises: AROM, Left, 10 reps, Supine Goniometric ROM: 0-50    General Comments        Pertinent Vitals/Pain Pain Assessment Pain Score: 2  Pain Location: L knee Pain Descriptors / Indicators: Sore Pain Intervention(s): Limited activity within patient's tolerance, Repositioned, Monitored during session    Home Living  Prior Function            PT Goals (current goals can now be found in the care plan section) Progress towards PT goals: Progressing toward goals    Frequency    7X/week      PT Plan Current plan remains appropriate    Co-evaluation              AM-PAC PT "6 Clicks" Mobility   Outcome Measure  Help needed turning from your back to your side while in a flat bed without using bedrails?: A Little Help needed moving from lying on your back to sitting on the side of a flat bed without using bedrails?: A Little Help needed moving to and from a bed to a chair (including a  wheelchair)?: None Help needed standing up from a chair using your arms (e.g., wheelchair or bedside chair)?: None Help needed to walk in hospital room?: A Little Help needed climbing 3-5 steps with a railing? : A Little 6 Click Score: 20    End of Session   Activity Tolerance: Patient tolerated treatment well Patient left: in bed;with call bell/phone within reach;Other (comment) (with heel roll) Nurse Communication: Mobility status PT Visit Diagnosis: Other abnormalities of gait and mobility (R26.89);Pain     Time: 0751-0819 PT Time Calculation (min) (ACUTE ONLY): 28 min  Charges:  $Gait Training: 8-22 mins $Therapeutic Exercise: 8-22 mins                     Bayard Males, PT Acute Rehabilitation Services Office: [place] 11/23/2022, 8:57 AM

## 2022-11-23 NOTE — Plan of Care (Signed)
  Problem: Education: Goal: Knowledge of the prescribed therapeutic regimen will improve Outcome: Completed/Met Goal: Individualized Educational Video(s) Outcome: Completed/Met   Problem: Activity: Goal: Ability to avoid complications of mobility impairment will improve Outcome: Completed/Met Goal: Range of joint motion will improve Outcome: Completed/Met   Problem: Clinical Measurements: Goal: Postoperative complications will be avoided or minimized Outcome: Completed/Met   Problem: Pain Management: Goal: Pain level will decrease with appropriate interventions Outcome: Completed/Met   Problem: Skin Integrity: Goal: Will show signs of wound healing Outcome: Completed/Met  Patient alert and oriented, ambulate, void. Surgical site clean and dry. Patient d/c home per order, d/c instructions explain and given to the patient.

## 2022-11-24 ENCOUNTER — Other Ambulatory Visit (INDEPENDENT_AMBULATORY_CARE_PROVIDER_SITE_OTHER): Payer: Self-pay | Admitting: Family Medicine

## 2022-11-24 DIAGNOSIS — M170 Bilateral primary osteoarthritis of knee: Secondary | ICD-10-CM

## 2022-11-25 ENCOUNTER — Encounter (HOSPITAL_COMMUNITY): Payer: Self-pay | Admitting: Orthopaedic Surgery

## 2022-11-25 ENCOUNTER — Encounter: Payer: Self-pay | Admitting: Orthopaedic Surgery

## 2022-11-25 ENCOUNTER — Telehealth: Payer: Self-pay | Admitting: Orthopaedic Surgery

## 2022-11-25 DIAGNOSIS — Z96652 Presence of left artificial knee joint: Secondary | ICD-10-CM | POA: Diagnosis not present

## 2022-11-25 DIAGNOSIS — E538 Deficiency of other specified B group vitamins: Secondary | ICD-10-CM | POA: Diagnosis not present

## 2022-11-25 DIAGNOSIS — G4730 Sleep apnea, unspecified: Secondary | ICD-10-CM | POA: Diagnosis not present

## 2022-11-25 DIAGNOSIS — E079 Disorder of thyroid, unspecified: Secondary | ICD-10-CM | POA: Diagnosis not present

## 2022-11-25 DIAGNOSIS — I10 Essential (primary) hypertension: Secondary | ICD-10-CM | POA: Diagnosis not present

## 2022-11-25 DIAGNOSIS — R7303 Prediabetes: Secondary | ICD-10-CM | POA: Diagnosis not present

## 2022-11-25 DIAGNOSIS — Z7901 Long term (current) use of anticoagulants: Secondary | ICD-10-CM | POA: Diagnosis not present

## 2022-11-25 DIAGNOSIS — E559 Vitamin D deficiency, unspecified: Secondary | ICD-10-CM | POA: Diagnosis not present

## 2022-11-25 DIAGNOSIS — Z87442 Personal history of urinary calculi: Secondary | ICD-10-CM | POA: Diagnosis not present

## 2022-11-25 DIAGNOSIS — Z791 Long term (current) use of non-steroidal anti-inflammatories (NSAID): Secondary | ICD-10-CM | POA: Diagnosis not present

## 2022-11-25 DIAGNOSIS — Z471 Aftercare following joint replacement surgery: Secondary | ICD-10-CM | POA: Diagnosis not present

## 2022-11-25 DIAGNOSIS — Z7984 Long term (current) use of oral hypoglycemic drugs: Secondary | ICD-10-CM | POA: Diagnosis not present

## 2022-11-25 NOTE — Telephone Encounter (Signed)
Called and gave verbal via voicemail.  

## 2022-11-25 NOTE — Telephone Encounter (Signed)
Mickel Baas (PT) from [facility] requesting verbal orders for home health pt for 3 wk 2, 2wk 1, 1wk 1. Please call Lura at [phone]. If unable to answer leave vm on secure line.

## 2022-11-27 DIAGNOSIS — Z471 Aftercare following joint replacement surgery: Secondary | ICD-10-CM | POA: Diagnosis not present

## 2022-11-27 DIAGNOSIS — Z7901 Long term (current) use of anticoagulants: Secondary | ICD-10-CM | POA: Diagnosis not present

## 2022-11-27 DIAGNOSIS — Z87442 Personal history of urinary calculi: Secondary | ICD-10-CM | POA: Diagnosis not present

## 2022-11-27 DIAGNOSIS — Z96652 Presence of left artificial knee joint: Secondary | ICD-10-CM | POA: Diagnosis not present

## 2022-11-27 DIAGNOSIS — E559 Vitamin D deficiency, unspecified: Secondary | ICD-10-CM | POA: Diagnosis not present

## 2022-11-27 DIAGNOSIS — E538 Deficiency of other specified B group vitamins: Secondary | ICD-10-CM | POA: Diagnosis not present

## 2022-11-27 DIAGNOSIS — G4730 Sleep apnea, unspecified: Secondary | ICD-10-CM | POA: Diagnosis not present

## 2022-11-27 DIAGNOSIS — I10 Essential (primary) hypertension: Secondary | ICD-10-CM | POA: Diagnosis not present

## 2022-11-27 DIAGNOSIS — R7303 Prediabetes: Secondary | ICD-10-CM | POA: Diagnosis not present

## 2022-11-27 DIAGNOSIS — E079 Disorder of thyroid, unspecified: Secondary | ICD-10-CM | POA: Diagnosis not present

## 2022-11-27 DIAGNOSIS — Z791 Long term (current) use of non-steroidal anti-inflammatories (NSAID): Secondary | ICD-10-CM | POA: Diagnosis not present

## 2022-11-27 DIAGNOSIS — Z7984 Long term (current) use of oral hypoglycemic drugs: Secondary | ICD-10-CM | POA: Diagnosis not present

## 2022-11-29 ENCOUNTER — Other Ambulatory Visit: Payer: Self-pay | Admitting: Physician Assistant

## 2022-11-29 DIAGNOSIS — Z87442 Personal history of urinary calculi: Secondary | ICD-10-CM | POA: Diagnosis not present

## 2022-11-29 DIAGNOSIS — E538 Deficiency of other specified B group vitamins: Secondary | ICD-10-CM | POA: Diagnosis not present

## 2022-11-29 DIAGNOSIS — Z471 Aftercare following joint replacement surgery: Secondary | ICD-10-CM | POA: Diagnosis not present

## 2022-11-29 DIAGNOSIS — I10 Essential (primary) hypertension: Secondary | ICD-10-CM | POA: Diagnosis not present

## 2022-11-29 DIAGNOSIS — Z96652 Presence of left artificial knee joint: Secondary | ICD-10-CM | POA: Diagnosis not present

## 2022-11-29 DIAGNOSIS — E559 Vitamin D deficiency, unspecified: Secondary | ICD-10-CM | POA: Diagnosis not present

## 2022-11-29 DIAGNOSIS — G4730 Sleep apnea, unspecified: Secondary | ICD-10-CM | POA: Diagnosis not present

## 2022-11-29 DIAGNOSIS — Z791 Long term (current) use of non-steroidal anti-inflammatories (NSAID): Secondary | ICD-10-CM | POA: Diagnosis not present

## 2022-11-29 DIAGNOSIS — Z7984 Long term (current) use of oral hypoglycemic drugs: Secondary | ICD-10-CM | POA: Diagnosis not present

## 2022-11-29 DIAGNOSIS — E079 Disorder of thyroid, unspecified: Secondary | ICD-10-CM | POA: Diagnosis not present

## 2022-11-29 DIAGNOSIS — R7303 Prediabetes: Secondary | ICD-10-CM | POA: Diagnosis not present

## 2022-11-29 DIAGNOSIS — Z7901 Long term (current) use of anticoagulants: Secondary | ICD-10-CM | POA: Diagnosis not present

## 2022-12-02 ENCOUNTER — Encounter: Payer: Self-pay | Admitting: Orthopaedic Surgery

## 2022-12-02 ENCOUNTER — Telehealth: Payer: Self-pay | Admitting: Orthopaedic Surgery

## 2022-12-02 ENCOUNTER — Other Ambulatory Visit: Payer: Self-pay | Admitting: Physician Assistant

## 2022-12-02 DIAGNOSIS — Z791 Long term (current) use of non-steroidal anti-inflammatories (NSAID): Secondary | ICD-10-CM | POA: Diagnosis not present

## 2022-12-02 DIAGNOSIS — R7303 Prediabetes: Secondary | ICD-10-CM | POA: Diagnosis not present

## 2022-12-02 DIAGNOSIS — E559 Vitamin D deficiency, unspecified: Secondary | ICD-10-CM | POA: Diagnosis not present

## 2022-12-02 DIAGNOSIS — Z87442 Personal history of urinary calculi: Secondary | ICD-10-CM | POA: Diagnosis not present

## 2022-12-02 DIAGNOSIS — Z7984 Long term (current) use of oral hypoglycemic drugs: Secondary | ICD-10-CM | POA: Diagnosis not present

## 2022-12-02 DIAGNOSIS — I10 Essential (primary) hypertension: Secondary | ICD-10-CM | POA: Diagnosis not present

## 2022-12-02 DIAGNOSIS — E538 Deficiency of other specified B group vitamins: Secondary | ICD-10-CM | POA: Diagnosis not present

## 2022-12-02 DIAGNOSIS — G4730 Sleep apnea, unspecified: Secondary | ICD-10-CM | POA: Diagnosis not present

## 2022-12-02 DIAGNOSIS — Z96652 Presence of left artificial knee joint: Secondary | ICD-10-CM | POA: Diagnosis not present

## 2022-12-02 DIAGNOSIS — E079 Disorder of thyroid, unspecified: Secondary | ICD-10-CM | POA: Diagnosis not present

## 2022-12-02 DIAGNOSIS — Z7901 Long term (current) use of anticoagulants: Secondary | ICD-10-CM | POA: Diagnosis not present

## 2022-12-02 DIAGNOSIS — Z471 Aftercare following joint replacement surgery: Secondary | ICD-10-CM | POA: Diagnosis not present

## 2022-12-02 MED ORDER — HYDROCODONE-ACETAMINOPHEN 7.5-325 MG PO TABS: 1.0000 | ORAL_TABLET | Freq: Four times a day (QID) | ORAL | 0 refills | Status: DC | PRN

## 2022-12-02 NOTE — Telephone Encounter (Signed)
Which pain med is she needing?

## 2022-12-02 NOTE — Telephone Encounter (Signed)
Patient message already sent to Bee about this.

## 2022-12-02 NOTE — Telephone Encounter (Signed)
sent

## 2022-12-02 NOTE — Telephone Encounter (Signed)
Laura Knox from [facility] called in about patient being in pain and would like Hydrocodone sent to [facility] [place] please advise

## 2022-12-04 DIAGNOSIS — Z87442 Personal history of urinary calculi: Secondary | ICD-10-CM | POA: Diagnosis not present

## 2022-12-04 DIAGNOSIS — Z96652 Presence of left artificial knee joint: Secondary | ICD-10-CM | POA: Diagnosis not present

## 2022-12-04 DIAGNOSIS — E538 Deficiency of other specified B group vitamins: Secondary | ICD-10-CM | POA: Diagnosis not present

## 2022-12-04 DIAGNOSIS — I10 Essential (primary) hypertension: Secondary | ICD-10-CM | POA: Diagnosis not present

## 2022-12-04 DIAGNOSIS — Z471 Aftercare following joint replacement surgery: Secondary | ICD-10-CM | POA: Diagnosis not present

## 2022-12-04 DIAGNOSIS — E559 Vitamin D deficiency, unspecified: Secondary | ICD-10-CM | POA: Diagnosis not present

## 2022-12-04 DIAGNOSIS — G4730 Sleep apnea, unspecified: Secondary | ICD-10-CM | POA: Diagnosis not present

## 2022-12-04 DIAGNOSIS — E079 Disorder of thyroid, unspecified: Secondary | ICD-10-CM | POA: Diagnosis not present

## 2022-12-04 DIAGNOSIS — Z791 Long term (current) use of non-steroidal anti-inflammatories (NSAID): Secondary | ICD-10-CM | POA: Diagnosis not present

## 2022-12-04 DIAGNOSIS — R7303 Prediabetes: Secondary | ICD-10-CM | POA: Diagnosis not present

## 2022-12-04 DIAGNOSIS — Z7901 Long term (current) use of anticoagulants: Secondary | ICD-10-CM | POA: Diagnosis not present

## 2022-12-04 DIAGNOSIS — Z7984 Long term (current) use of oral hypoglycemic drugs: Secondary | ICD-10-CM | POA: Diagnosis not present

## 2022-12-05 ENCOUNTER — Encounter (INDEPENDENT_AMBULATORY_CARE_PROVIDER_SITE_OTHER): Payer: Self-pay

## 2022-12-05 ENCOUNTER — Ambulatory Visit (INDEPENDENT_AMBULATORY_CARE_PROVIDER_SITE_OTHER): Payer: BC Managed Care – PPO | Admitting: Family Medicine

## 2022-12-06 ENCOUNTER — Ambulatory Visit (INDEPENDENT_AMBULATORY_CARE_PROVIDER_SITE_OTHER): Payer: BC Managed Care – PPO | Admitting: Physician Assistant

## 2022-12-06 ENCOUNTER — Encounter: Payer: Self-pay | Admitting: Orthopaedic Surgery

## 2022-12-06 ENCOUNTER — Other Ambulatory Visit: Payer: Self-pay | Admitting: Physician Assistant

## 2022-12-06 DIAGNOSIS — Z96652 Presence of left artificial knee joint: Secondary | ICD-10-CM

## 2022-12-06 DIAGNOSIS — Z7901 Long term (current) use of anticoagulants: Secondary | ICD-10-CM | POA: Diagnosis not present

## 2022-12-06 DIAGNOSIS — Z791 Long term (current) use of non-steroidal anti-inflammatories (NSAID): Secondary | ICD-10-CM | POA: Diagnosis not present

## 2022-12-06 DIAGNOSIS — E079 Disorder of thyroid, unspecified: Secondary | ICD-10-CM | POA: Diagnosis not present

## 2022-12-06 DIAGNOSIS — Z87442 Personal history of urinary calculi: Secondary | ICD-10-CM | POA: Diagnosis not present

## 2022-12-06 DIAGNOSIS — E559 Vitamin D deficiency, unspecified: Secondary | ICD-10-CM | POA: Diagnosis not present

## 2022-12-06 DIAGNOSIS — Z471 Aftercare following joint replacement surgery: Secondary | ICD-10-CM | POA: Diagnosis not present

## 2022-12-06 DIAGNOSIS — I10 Essential (primary) hypertension: Secondary | ICD-10-CM | POA: Diagnosis not present

## 2022-12-06 DIAGNOSIS — E538 Deficiency of other specified B group vitamins: Secondary | ICD-10-CM | POA: Diagnosis not present

## 2022-12-06 DIAGNOSIS — G4730 Sleep apnea, unspecified: Secondary | ICD-10-CM | POA: Diagnosis not present

## 2022-12-06 DIAGNOSIS — Z7984 Long term (current) use of oral hypoglycemic drugs: Secondary | ICD-10-CM | POA: Diagnosis not present

## 2022-12-06 DIAGNOSIS — R7303 Prediabetes: Secondary | ICD-10-CM | POA: Diagnosis not present

## 2022-12-06 NOTE — Progress Notes (Signed)
Post-Op Visit Note   Patient: Laura Knox           Date of Birth: 05-Jul-1966           MRN: [id number] Visit Date: 12/06/2022 PCP: Mackie Pai, PA-C   Assessment & Plan:  Chief Complaint:  Chief Complaint  Patient presents with   Left Knee - Follow-up    Left total knee arthroplasty 11/22/2022   Visit Diagnoses:  1. Status post total left knee replacement     Plan: Patient is a pleasant 57 year old female who comes in today 2 weeks status post left total knee replacement 11/22/2022.  She has been doing relatively well.  She has been getting home health physical therapy and is ambulating with a walker.  She has been compliant taking Xarelto once daily for DVT prophylaxis.  She has been taking Norco for pain.  Examination of the left knee reveals a fully healed surgical scar with nylon sutures in place.  No evidence of infection or cellulitis.  Calves are soft nontender.  Today, sutures were removed and Steri-Strips applied external referral for outpatient physical [facility] at [facility] [place] was sent in.  She tells me she thinks she may need a refill of Xarelto but I have discussed with her that 30 tablets were sent in.  She will check when she gets home and let me know if she does not have another 2 weeks left.  I have discussed that she should stop taking Aleve which she has been taking twice daily due to increased risk of GI bleed while she is on Xarelto.  I will provide her with a prescription to [facility] for right heel lift as she feels like there is a significant leg length discrepancy.  She will follow-up with [place] in 4 weeks for repeat evaluation and 2 view x-rays of the left knee.  Call with concerns or questions.  Follow-Up Instructions: Return in about 4 weeks (around 01/03/2023).   Orders:  Orders Placed This Encounter  Procedures   Ambulatory referral to Physical Therapy   No orders of the defined types were placed in this encounter.   Imaging: No new imaging  PMFS  History: Patient Active Problem List   Diagnosis Date Noted   OA (osteoarthritis) of knee 11/22/2022   Status post total left knee replacement 11/22/2022   Primary osteoarthritis of left knee 11/21/2022   Left knee pain 10/14/2022   Osteoarthritis of both knees 07/22/2022   Achilles tendinitis 03/21/2022   Pre-diabetes 02/27/2022   Vitamin D deficiency 02/27/2022   At risk for diabetes mellitus 02/27/2022   Plantar fasciitis of right foot 01/08/2022   Special screening for malignant neoplasms, colon    Rectal polyp    Essential hypertension 01/29/2021   Class 3 severe obesity with serious comorbidity and body mass index (BMI) of 60.0 to 69.9 in adult ([place]) 01/29/2021   Decreased functional activity tolerance 07/24/2020   Cervical spondylosis 07/24/2020   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   Obstructive sleep apnea syndrome 07/20/2015   Arthritis 01/06/2014   Past Medical History:  Diagnosis Date   B12 deficiency    Bilateral swelling of feet and ankles    Hypertension    Joint pain    Kidney stones    Knee pain    Osteoarthritis    Other fatigue    Pre-diabetes    Prediabetes    Sleep apnea    does not use CPAP   Thyroid disease    Vitamin D deficiency  Family History  Problem Relation Age of Onset   Healthy Mother    Obesity Mother    Cancer Father    Obesity Father    Cancer - Other Father    Stroke Maternal Grandmother    Lung cancer Maternal Grandfather     Past Surgical History:  Procedure Laterality Date   COLONOSCOPY WITH PROPOFOL N/A 04/10/2021   Procedure: COLONOSCOPY WITH PROPOFOL;  Surgeon: Jackquline Denmark, MD;  Location: [facility];  Service: Endoscopy;  Laterality: N/A;   KIDNEY STONE SURGERY     miniscus repair Left    POLYPECTOMY  04/10/2021   Procedure: POLYPECTOMY;  Surgeon: Jackquline Denmark, MD;  Location: [facility];  Service: Endoscopy;;   SEPTOPLASTY     TOTAL KNEE ARTHROPLASTY Left 11/22/2022   Procedure: LEFT TOTAL KNEE REPLACEMENT;  Surgeon:  Leandrew Koyanagi, MD;  Location: [facility];  Service: Orthopedics;  Laterality: Left;   Social History   Occupational History   Occupation: Retired  Tobacco Use   Smoking status: Never   Smokeless tobacco: Never  Vaping Use   Vaping Use: Never used  Substance and Sexual Activity   Alcohol use: Not Currently   Drug use: Never   Sexual activity: Yes    Partners: Male

## 2022-12-07 ENCOUNTER — Other Ambulatory Visit: Payer: Self-pay | Admitting: Medical

## 2022-12-08 DIAGNOSIS — Z96652 Presence of left artificial knee joint: Secondary | ICD-10-CM | POA: Diagnosis not present

## 2022-12-08 DIAGNOSIS — I10 Essential (primary) hypertension: Secondary | ICD-10-CM | POA: Diagnosis not present

## 2022-12-08 DIAGNOSIS — Z87442 Personal history of urinary calculi: Secondary | ICD-10-CM | POA: Diagnosis not present

## 2022-12-08 DIAGNOSIS — R7303 Prediabetes: Secondary | ICD-10-CM | POA: Diagnosis not present

## 2022-12-08 DIAGNOSIS — G4730 Sleep apnea, unspecified: Secondary | ICD-10-CM | POA: Diagnosis not present

## 2022-12-08 DIAGNOSIS — Z7984 Long term (current) use of oral hypoglycemic drugs: Secondary | ICD-10-CM | POA: Diagnosis not present

## 2022-12-08 DIAGNOSIS — Z7901 Long term (current) use of anticoagulants: Secondary | ICD-10-CM | POA: Diagnosis not present

## 2022-12-08 DIAGNOSIS — E538 Deficiency of other specified B group vitamins: Secondary | ICD-10-CM | POA: Diagnosis not present

## 2022-12-08 DIAGNOSIS — Z791 Long term (current) use of non-steroidal anti-inflammatories (NSAID): Secondary | ICD-10-CM | POA: Diagnosis not present

## 2022-12-08 DIAGNOSIS — E079 Disorder of thyroid, unspecified: Secondary | ICD-10-CM | POA: Diagnosis not present

## 2022-12-08 DIAGNOSIS — E559 Vitamin D deficiency, unspecified: Secondary | ICD-10-CM | POA: Diagnosis not present

## 2022-12-08 DIAGNOSIS — Z471 Aftercare following joint replacement surgery: Secondary | ICD-10-CM | POA: Diagnosis not present

## 2022-12-11 DIAGNOSIS — Z96652 Presence of left artificial knee joint: Secondary | ICD-10-CM | POA: Diagnosis not present

## 2022-12-11 DIAGNOSIS — E538 Deficiency of other specified B group vitamins: Secondary | ICD-10-CM | POA: Diagnosis not present

## 2022-12-11 DIAGNOSIS — I10 Essential (primary) hypertension: Secondary | ICD-10-CM | POA: Diagnosis not present

## 2022-12-11 DIAGNOSIS — G4730 Sleep apnea, unspecified: Secondary | ICD-10-CM | POA: Diagnosis not present

## 2022-12-11 DIAGNOSIS — Z7984 Long term (current) use of oral hypoglycemic drugs: Secondary | ICD-10-CM | POA: Diagnosis not present

## 2022-12-11 DIAGNOSIS — E079 Disorder of thyroid, unspecified: Secondary | ICD-10-CM | POA: Diagnosis not present

## 2022-12-11 DIAGNOSIS — Z87442 Personal history of urinary calculi: Secondary | ICD-10-CM | POA: Diagnosis not present

## 2022-12-11 DIAGNOSIS — Z791 Long term (current) use of non-steroidal anti-inflammatories (NSAID): Secondary | ICD-10-CM | POA: Diagnosis not present

## 2022-12-11 DIAGNOSIS — Z7901 Long term (current) use of anticoagulants: Secondary | ICD-10-CM | POA: Diagnosis not present

## 2022-12-11 DIAGNOSIS — R7303 Prediabetes: Secondary | ICD-10-CM | POA: Diagnosis not present

## 2022-12-11 DIAGNOSIS — E559 Vitamin D deficiency, unspecified: Secondary | ICD-10-CM | POA: Diagnosis not present

## 2022-12-11 DIAGNOSIS — Z471 Aftercare following joint replacement surgery: Secondary | ICD-10-CM | POA: Diagnosis not present

## 2022-12-13 ENCOUNTER — Other Ambulatory Visit: Payer: Self-pay | Admitting: Medical

## 2022-12-13 ENCOUNTER — Encounter: Payer: Self-pay | Admitting: Orthopaedic Surgery

## 2022-12-13 ENCOUNTER — Other Ambulatory Visit: Payer: Self-pay | Admitting: Physician Assistant

## 2022-12-13 DIAGNOSIS — M25562 Pain in left knee: Secondary | ICD-10-CM | POA: Diagnosis not present

## 2022-12-13 DIAGNOSIS — Z96652 Presence of left artificial knee joint: Secondary | ICD-10-CM | POA: Diagnosis not present

## 2022-12-13 MED ORDER — HYDROCODONE-ACETAMINOPHEN 7.5-325 MG PO TABS: 1.0000 | ORAL_TABLET | Freq: Three times a day (TID) | ORAL | 0 refills | Status: DC | PRN

## 2022-12-13 NOTE — Telephone Encounter (Signed)
sent

## 2022-12-17 DIAGNOSIS — M25562 Pain in left knee: Secondary | ICD-10-CM | POA: Diagnosis not present

## 2022-12-17 DIAGNOSIS — Z96652 Presence of left artificial knee joint: Secondary | ICD-10-CM | POA: Diagnosis not present

## 2022-12-19 ENCOUNTER — Other Ambulatory Visit: Payer: Self-pay | Admitting: Medical

## 2022-12-19 DIAGNOSIS — M25562 Pain in left knee: Secondary | ICD-10-CM | POA: Diagnosis not present

## 2022-12-19 DIAGNOSIS — Z96652 Presence of left artificial knee joint: Secondary | ICD-10-CM | POA: Diagnosis not present

## 2022-12-22 ENCOUNTER — Encounter: Payer: Self-pay | Admitting: Orthopaedic Surgery

## 2022-12-23 DIAGNOSIS — Z96652 Presence of left artificial knee joint: Secondary | ICD-10-CM | POA: Diagnosis not present

## 2022-12-23 DIAGNOSIS — M25562 Pain in left knee: Secondary | ICD-10-CM | POA: Diagnosis not present

## 2022-12-24 ENCOUNTER — Encounter (INDEPENDENT_AMBULATORY_CARE_PROVIDER_SITE_OTHER): Payer: Self-pay | Admitting: Family Medicine

## 2022-12-24 ENCOUNTER — Ambulatory Visit (INDEPENDENT_AMBULATORY_CARE_PROVIDER_SITE_OTHER): Payer: BC Managed Care – PPO | Admitting: Family Medicine

## 2022-12-24 ENCOUNTER — Other Ambulatory Visit: Payer: Self-pay | Admitting: Physician Assistant

## 2022-12-24 VITALS — BP 134/89 | HR 92 | Temp 97.7°F | Ht 67.0 in | Wt 284.0 lb

## 2022-12-24 DIAGNOSIS — F3289 Other specified depressive episodes: Secondary | ICD-10-CM

## 2022-12-24 DIAGNOSIS — F32A Depression, unspecified: Secondary | ICD-10-CM | POA: Insufficient documentation

## 2022-12-24 DIAGNOSIS — E669 Obesity, unspecified: Secondary | ICD-10-CM

## 2022-12-24 DIAGNOSIS — Z6841 Body Mass Index (BMI) 40.0 and over, adult: Secondary | ICD-10-CM | POA: Diagnosis not present

## 2022-12-24 NOTE — Telephone Encounter (Signed)
Only needs for one month

## 2022-12-25 DIAGNOSIS — M25562 Pain in left knee: Secondary | ICD-10-CM | POA: Diagnosis not present

## 2022-12-25 DIAGNOSIS — Z96652 Presence of left artificial knee joint: Secondary | ICD-10-CM | POA: Diagnosis not present

## 2022-12-25 NOTE — Telephone Encounter (Signed)
Ok to resume fish oil.  You can resume meloxicam OR aleve, just not both.  Do not exceed what's on bottle

## 2022-12-26 ENCOUNTER — Other Ambulatory Visit (INDEPENDENT_AMBULATORY_CARE_PROVIDER_SITE_OTHER): Payer: Self-pay | Admitting: Family Medicine

## 2022-12-26 DIAGNOSIS — R7303 Prediabetes: Secondary | ICD-10-CM

## 2022-12-30 DIAGNOSIS — M25562 Pain in left knee: Secondary | ICD-10-CM | POA: Diagnosis not present

## 2022-12-30 DIAGNOSIS — Z96652 Presence of left artificial knee joint: Secondary | ICD-10-CM | POA: Diagnosis not present

## 2023-01-01 DIAGNOSIS — M25562 Pain in left knee: Secondary | ICD-10-CM | POA: Diagnosis not present

## 2023-01-01 DIAGNOSIS — Z96652 Presence of left artificial knee joint: Secondary | ICD-10-CM | POA: Diagnosis not present

## 2023-01-03 ENCOUNTER — Ambulatory Visit (INDEPENDENT_AMBULATORY_CARE_PROVIDER_SITE_OTHER): Payer: Self-pay

## 2023-01-03 ENCOUNTER — Encounter (INDEPENDENT_AMBULATORY_CARE_PROVIDER_SITE_OTHER): Payer: Self-pay | Admitting: Family Medicine

## 2023-01-03 ENCOUNTER — Ambulatory Visit (INDEPENDENT_AMBULATORY_CARE_PROVIDER_SITE_OTHER): Payer: BC Managed Care – PPO | Admitting: Orthopaedic Surgery

## 2023-01-03 ENCOUNTER — Encounter: Payer: Self-pay | Admitting: Orthopaedic Surgery

## 2023-01-03 DIAGNOSIS — Z96652 Presence of left artificial knee joint: Secondary | ICD-10-CM

## 2023-01-03 NOTE — Progress Notes (Signed)
Post-Op Visit Note   Patient: Laura Knox           Date of Birth: September 09, 1966           MRN: [id number] Visit Date: 01/03/2023 PCP: Mackie Pai, PA-C   Assessment & Plan:  Chief Complaint:  Chief Complaint  Patient presents with   Left Knee - Follow-up    Left total knee arthroplasty 11/22/2022   Visit Diagnoses:  1. Status post total left knee replacement     Plan: Laura Knox is 6 weeks status post left total knee replacement.  Doing well overall.  She is making significant progress at physical therapy.  She started using a quad cane yesterday.  Examination shows fully healed surgical scar.  Range of motion is progressing nicely.  Good varus valgus stability.  X-rays demonstrate stable implant without any complications.  I am happy with Laura Knox's progress so far.  She will continue outpatient PT at [facility] [place].  Dental prophylaxis reinforced.  Increase activity as tolerated.  Recheck in 6 weeks  Follow-Up Instructions: Return in about 6 weeks (around 02/14/2023).   Orders:  Orders Placed This Encounter  Procedures   XR Knee 1-2 Views Left   No orders of the defined types were placed in this encounter.   Imaging: XR Knee 1-2 Views Left  Result Date: 01/03/2023 Stable total knee replacement in good alignment.    PMFS History: Patient Active Problem List   Diagnosis Date Noted   Depression 12/24/2022   BMI 40.0-44.9, adult ([place]) 12/24/2022   Obesity, Beginning BMI 61.01 12/24/2022   OA (osteoarthritis) of knee 11/22/2022   Status post total left knee replacement 11/22/2022   Primary osteoarthritis of left knee 11/21/2022   Left knee pain 10/14/2022   Osteoarthritis of both knees 07/22/2022   Achilles tendinitis 03/21/2022   Pre-diabetes 02/27/2022   Vitamin D deficiency 02/27/2022   At risk for diabetes mellitus 02/27/2022   Plantar fasciitis of right foot 01/08/2022   Special screening for malignant neoplasms, colon    Rectal polyp    Essential  hypertension 01/29/2021   Class 3 severe obesity with serious comorbidity and body mass index (BMI) of 60.0 to 69.9 in adult ([place]) 01/29/2021   Decreased functional activity tolerance 07/24/2020   Cervical spondylosis 07/24/2020   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   Obstructive sleep apnea syndrome 07/20/2015   Arthritis 01/06/2014   Past Medical History:  Diagnosis Date   B12 deficiency    Bilateral swelling of feet and ankles    Hypertension    Joint pain    Kidney stones    Knee pain    Osteoarthritis    Other fatigue    Pre-diabetes    Prediabetes    Sleep apnea    does not use CPAP   Thyroid disease    Vitamin D deficiency     Family History  Problem Relation Age of Onset   Healthy Mother    Obesity Mother    Cancer Father    Obesity Father    Cancer - Other Father    Stroke Maternal Grandmother    Lung cancer Maternal Grandfather     Past Surgical History:  Procedure Laterality Date   COLONOSCOPY WITH PROPOFOL N/A 04/10/2021   Procedure: COLONOSCOPY WITH PROPOFOL;  Surgeon: Jackquline Denmark, MD;  Location: [facility];  Service: Endoscopy;  Laterality: N/A;   KIDNEY STONE SURGERY     miniscus repair Left    POLYPECTOMY  04/10/2021   Procedure: POLYPECTOMY;  Surgeon: Jackquline Denmark, MD;  Location: [facility];  Service: Endoscopy;;   SEPTOPLASTY     TOTAL KNEE ARTHROPLASTY Left 11/22/2022   Procedure: LEFT TOTAL KNEE REPLACEMENT;  Surgeon: Leandrew Koyanagi, MD;  Location: [facility];  Service: Orthopedics;  Laterality: Left;   Social History   Occupational History   Occupation: Retired  Tobacco Use   Smoking status: Never   Smokeless tobacco: Never  Vaping Use   Vaping Use: Never used  Substance and Sexual Activity   Alcohol use: Not Currently   Drug use: Never   Sexual activity: Yes    Partners: Male

## 2023-01-05 ENCOUNTER — Encounter (INDEPENDENT_AMBULATORY_CARE_PROVIDER_SITE_OTHER): Payer: Self-pay | Admitting: Family Medicine

## 2023-01-06 DIAGNOSIS — M25562 Pain in left knee: Secondary | ICD-10-CM | POA: Diagnosis not present

## 2023-01-06 DIAGNOSIS — Z96652 Presence of left artificial knee joint: Secondary | ICD-10-CM | POA: Diagnosis not present

## 2023-01-07 ENCOUNTER — Other Ambulatory Visit (INDEPENDENT_AMBULATORY_CARE_PROVIDER_SITE_OTHER): Payer: Self-pay | Admitting: Family Medicine

## 2023-01-07 DIAGNOSIS — R7303 Prediabetes: Secondary | ICD-10-CM

## 2023-01-07 MED ORDER — METFORMIN HCL 500 MG PO TABS: ORAL_TABLET | ORAL | 0 refills | Status: DC

## 2023-01-08 ENCOUNTER — Other Ambulatory Visit (INDEPENDENT_AMBULATORY_CARE_PROVIDER_SITE_OTHER): Payer: Self-pay | Admitting: Family Medicine

## 2023-01-08 ENCOUNTER — Telehealth: Payer: Self-pay | Admitting: *Deleted

## 2023-01-08 ENCOUNTER — Other Ambulatory Visit: Payer: Self-pay | Admitting: Medical

## 2023-01-08 DIAGNOSIS — M25562 Pain in left knee: Secondary | ICD-10-CM | POA: Diagnosis not present

## 2023-01-08 DIAGNOSIS — Z96652 Presence of left artificial knee joint: Secondary | ICD-10-CM | POA: Diagnosis not present

## 2023-01-08 NOTE — Telephone Encounter (Signed)
Returned call from 10:44 AM. Left patient a message to call and schedule.

## 2023-01-12 ENCOUNTER — Other Ambulatory Visit: Payer: Self-pay | Admitting: Medical

## 2023-01-13 DIAGNOSIS — Z96652 Presence of left artificial knee joint: Secondary | ICD-10-CM | POA: Diagnosis not present

## 2023-01-13 DIAGNOSIS — M25562 Pain in left knee: Secondary | ICD-10-CM | POA: Diagnosis not present

## 2023-01-13 NOTE — Progress Notes (Signed)
Chief Complaint:   OBESITY Laura Knox is here to discuss her progress with her obesity treatment plan along with follow-up of her obesity related diagnoses. Laura Knox is on following a lower carbohydrate, vegetable and lean protein rich diet plan and states she is following her eating plan approximately 50% of the time. Laura Knox states she is doing physical therapy.    Today's visit was #: 57 Starting weight: 378 lbs Starting date: 12/27/2020 Today's weight: 284 lbs Today's date: 12/24/2022 Total lbs lost to date: 94 Total lbs lost since last in-office visit: 1  Interim History: Laura Knox has left knee replacement surgery.  She is doing physical therapy faithfully.  She is still working on eating healthier, but her protein has been a challenge recently.  She does not have any family with her to help her during recovery.  Subjective:   1. Other Specified Depressive Disorder, Emotional Eating Behaviors Laura Knox has been working on decreasing emotional eating behaviors, but her stress level is up as well as her post-op pain.  She has done very well with avoiding weight gain during this time.  Assessment/Plan:   1. Other Specified Depressive Disorder, Emotional Eating Behaviors Follow-up with offered support and encouragement, and reminded that not gaining weight after surgery meaning she is doing well overall.  2. BMI 40.0-44.9, adult ([place])  3. Obesity, Beginning BMI 61.01 Laura Knox is currently in the action stage of change. As such, her goal is to continue with weight loss efforts. She has agreed to following a lower carbohydrate, vegetable and lean protein rich diet plan.   Exercise goals: Per physical therapy.   Behavioral modification strategies: meal planning and cooking strategies and emotional eating strategies.  Laura Knox has agreed to follow-up with our clinic in 4 weeks. She was informed of the importance of frequent follow-up visits to maximize her success with intensive lifestyle modifications  for her multiple health conditions.   Objective:   Blood pressure 134/89, pulse 92, temperature 97.7 F (36.5 C), height 5\' 7"  (1.702 m), weight 284 lb (128.8 kg), SpO2 92 %. Body mass index is 44.48 kg/m.  Lab Results  Component Value Date   CREATININE 0.65 11/13/2022   BUN 17 11/13/2022   NA 137 11/13/2022   K 3.4 (L) 11/13/2022   CL 104 11/13/2022   CO2 25 11/13/2022   Lab Results  Component Value Date   ALT 11 08/12/2022   AST 11 08/12/2022   ALKPHOS 98 08/12/2022   BILITOT 0.6 08/12/2022   Lab Results  Component Value Date   HGBA1C 5.8 (H) 08/12/2022   HGBA1C 5.6 02/06/2022   HGBA1C 5.9 (H) 06/18/2021   HGBA1C 5.9 02/06/2021   HGBA1C 6.0 (H) 12/27/2020   Lab Results  Component Value Date   INSULIN 7.4 08/12/2022   INSULIN 16.7 06/18/2021   INSULIN 20.4 12/27/2020   Lab Results  Component Value Date   TSH 0.353 (L) 08/12/2022   Lab Results  Component Value Date   CHOL 141 12/27/2020   HDL 44 12/27/2020   LDLCALC 81 12/27/2020   TRIG 80 12/27/2020   Lab Results  Component Value Date   VD25OH 41.3 08/12/2022   VD25OH 46.7 02/06/2022   VD25OH 76.3 06/18/2021   Lab Results  Component Value Date   WBC 13.7 (H) 11/23/2022   HGB 12.9 11/23/2022   HCT 38.2 11/23/2022   MCV 86.2 11/23/2022   PLT 289 11/23/2022   Lab Results  Component Value Date   FERRITIN 88 02/06/2021   Attestation  Statements:   Reviewed by clinician on day of visit: allergies, medications, problem list, medical history, surgical history, family history, social history, and previous encounter notes.  I have personally spent 42 minutes total time today in preparation, patient care, and documentation for this visit, including the following: review of clinical lab tests; review of medical tests/procedures/services.   I, Trixie Dredge, am acting as transcriptionist for Dennard Nip, MD.  I have reviewed the above documentation for accuracy and completeness, and I agree with the  above. -  Dennard Nip, MD

## 2023-01-15 DIAGNOSIS — M25562 Pain in left knee: Secondary | ICD-10-CM | POA: Diagnosis not present

## 2023-01-15 DIAGNOSIS — Z96652 Presence of left artificial knee joint: Secondary | ICD-10-CM | POA: Diagnosis not present

## 2023-01-22 ENCOUNTER — Ambulatory Visit (INDEPENDENT_AMBULATORY_CARE_PROVIDER_SITE_OTHER): Payer: BC Managed Care – PPO | Admitting: Family Medicine

## 2023-01-22 ENCOUNTER — Encounter (INDEPENDENT_AMBULATORY_CARE_PROVIDER_SITE_OTHER): Payer: Self-pay | Admitting: Family Medicine

## 2023-01-22 VITALS — BP 145/98 | HR 70 | Temp 97.6°F | Ht 67.0 in | Wt 282.0 lb

## 2023-01-22 DIAGNOSIS — Z6841 Body Mass Index (BMI) 40.0 and over, adult: Secondary | ICD-10-CM

## 2023-01-22 DIAGNOSIS — R7303 Prediabetes: Secondary | ICD-10-CM

## 2023-01-22 DIAGNOSIS — Z96652 Presence of left artificial knee joint: Secondary | ICD-10-CM | POA: Diagnosis not present

## 2023-01-22 DIAGNOSIS — E669 Obesity, unspecified: Secondary | ICD-10-CM | POA: Diagnosis not present

## 2023-01-22 DIAGNOSIS — I10 Essential (primary) hypertension: Secondary | ICD-10-CM | POA: Diagnosis not present

## 2023-01-22 DIAGNOSIS — M25562 Pain in left knee: Secondary | ICD-10-CM | POA: Diagnosis not present

## 2023-01-22 MED ORDER — METFORMIN HCL 500 MG PO TABS: ORAL_TABLET | ORAL | 0 refills | Status: DC

## 2023-01-27 NOTE — Progress Notes (Unsigned)
Chief Complaint:   OBESITY Laura Knox is here to discuss her progress with her obesity treatment plan along with follow-up of her obesity related diagnoses. Laura Knox is on following a lower carbohydrate, vegetable and lean protein rich diet plan and states she is following her eating plan approximately 50% of the time. Laura Knox states she is doing 0 minutes 0 times per week.  Today's visit was #: 68 Starting weight: 378 lbs Starting date: 12/27/2020 Today's weight: 282 lbs Today's date: 01/22/2023 Total lbs lost to date: 96 Total lbs lost since last in-office visit: 2  Interim History: Laura Knox has been working on decreasing simple carbohydrates and she has done well with weight loss.  Her hunger is mostly controlled and she is working on increasing her protein.  Subjective:   1. Essential hypertension Laura Knox's blood pressure is elevated today. She may have had extra sodium in her diet. She denies chest pain or headache.   2. Pre-diabetes Laura Knox is on metformin and she is working on her diet.  She has struggled with some increased sugar but she is getting back on track.   Assessment/Plan:   1. Essential hypertension Laura Knox is to continue to increase her water intake and decrease her sodium intake.  2. Pre-diabetes Laura Knox will continue metformin, and we will refill for 1 month.  - metFORMIN (GLUCOPHAGE) 500 MG tablet; 2 po with lunch qd  Dispense: 60 tablet; Refill: 0  3. BMI 40.0-44.9, adult (Laura Knox)  4. Obesity, Beginning BMI 61.01 Laura Knox is currently in the action stage of change. As such, her goal is to continue with weight loss efforts. She has agreed to following a lower carbohydrate, vegetable and lean protein rich diet plan.   Behavioral modification strategies: increasing lean protein intake.  Laura Knox has agreed to follow-up with our clinic in 4 weeks. She was informed of the importance of frequent follow-up visits to maximize her success with intensive lifestyle modifications for her  multiple health conditions.   Objective:   Blood pressure (!) 145/98, pulse 70, temperature 97.6 F (36.4 C), height 5\' 7"  (1.702 m), weight 282 lb (127.9 kg), SpO2 98 %. Body mass index is 44.17 kg/m.  Lab Results  Component Value Date   CREATININE 0.65 11/13/2022   BUN 17 11/13/2022   NA 137 11/13/2022   K 3.4 (L) 11/13/2022   CL 104 11/13/2022   CO2 25 11/13/2022   Lab Results  Component Value Date   ALT 11 08/12/2022   AST 11 08/12/2022   ALKPHOS 98 08/12/2022   BILITOT 0.6 08/12/2022   Lab Results  Component Value Date   HGBA1C 5.8 (H) 08/12/2022   HGBA1C 5.6 02/06/2022   HGBA1C 5.9 (H) 06/18/2021   HGBA1C 5.9 02/06/2021   HGBA1C 6.0 (H) 12/27/2020   Lab Results  Component Value Date   INSULIN 7.4 08/12/2022   INSULIN 16.7 06/18/2021   INSULIN 20.4 12/27/2020   Lab Results  Component Value Date   TSH 0.353 (L) 08/12/2022   Lab Results  Component Value Date   CHOL 141 12/27/2020   HDL 44 12/27/2020   LDLCALC 81 12/27/2020   TRIG 80 12/27/2020   Lab Results  Component Value Date   VD25OH 41.3 08/12/2022   VD25OH 46.7 02/06/2022   VD25OH 76.3 06/18/2021   Lab Results  Component Value Date   WBC 13.7 (H) 11/23/2022   HGB 12.9 11/23/2022   HCT 38.2 11/23/2022   MCV 86.2 11/23/2022   PLT 289 11/23/2022   Lab Results  Component Value Date   FERRITIN 88 02/06/2021   Attestation Statements:   Reviewed by clinician on day of visit: allergies, medications, problem list, medical history, surgical history, family history, social history, and previous encounter notes.   I, Trixie Dredge, am acting as transcriptionist for Dennard Nip, MD.  I have reviewed the above documentation for accuracy and completeness, and I agree with the above. -  Dennard Nip, MD

## 2023-01-28 ENCOUNTER — Telehealth: Payer: Self-pay | Admitting: Orthopaedic Surgery

## 2023-01-28 NOTE — Telephone Encounter (Signed)
Re-faxed the [id number] [facility] certification order to [phone] & [phone]

## 2023-01-29 DIAGNOSIS — Z96652 Presence of left artificial knee joint: Secondary | ICD-10-CM | POA: Diagnosis not present

## 2023-01-29 DIAGNOSIS — M25562 Pain in left knee: Secondary | ICD-10-CM | POA: Diagnosis not present

## 2023-01-31 ENCOUNTER — Encounter: Payer: Self-pay | Admitting: Medical

## 2023-01-31 ENCOUNTER — Ambulatory Visit (INDEPENDENT_AMBULATORY_CARE_PROVIDER_SITE_OTHER): Payer: BC Managed Care – PPO | Admitting: Medical

## 2023-01-31 VITALS — BP 120/66 | HR 80 | Resp 18 | Ht 67.0 in | Wt 290.0 lb

## 2023-01-31 DIAGNOSIS — Z23 Encounter for immunization: Secondary | ICD-10-CM

## 2023-01-31 DIAGNOSIS — E039 Hypothyroidism, unspecified: Secondary | ICD-10-CM

## 2023-01-31 DIAGNOSIS — Z0000 Encounter for general adult medical examination without abnormal findings: Secondary | ICD-10-CM

## 2023-01-31 DIAGNOSIS — R739 Hyperglycemia, unspecified: Secondary | ICD-10-CM

## 2023-01-31 DIAGNOSIS — R7989 Other specified abnormal findings of blood chemistry: Secondary | ICD-10-CM

## 2023-01-31 NOTE — Progress Notes (Unsigned)
Last pap- 01/31/22- negative

## 2023-01-31 NOTE — Patient Instructions (Addendum)
For you wellness exam today I have ordered cbc, cmp and lipid panel. Get scheduled future fasting in am.  Will include A1c on future lab as well.  Please update me when you get mammogram so I can update maintenance  Vaccine given today. Shingrix and Td.  Recommend exercise and healthy diet.  We will let you know lab results as they come in.  Follow up date appointment will be determined after lab review.    Great job on weight loss.  Preventive Care 6240-58 Years Old, Female Preventive care refers to lifestyle choices and visits with your health care provider that can promote health and wellness. Preventive care visits are also called wellness exams. What can I expect for my preventive care visit? Counseling Your health care provider may ask you questions about your: Medical history, including: Past medical problems. Family medical history. Pregnancy history. Current health, including: Menstrual cycle. Method of birth control. Emotional well-being. Home life and relationship well-being. Sexual activity and sexual health. Lifestyle, including: Alcohol, nicotine or tobacco, and drug use. Access to firearms. Diet, exercise, and sleep habits. Work and work Astron[employer]. Sunscreen use. Safety issues such as seatbelt and bike helmet use. Physical exam Your health care provider will check your: Height and weight. These may be used to calculate your BMI (body mass index). BMI is a measurement that tells if you are at a healthy weight. Waist circumference. This measures the distance around your waistline. This measurement also tells if you are at a healthy weight and may help predict your risk of certain diseases, such as type 2 diabetes and high blood pressure. Heart rate and blood pressure. Body temperature. Skin for abnormal spots. What immunizations do I need?  Vaccines are usually given at various ages, according to a schedule. Your health care provider will recommend vaccines  for you based on your age, medical history, and lifestyle or other factors, such as travel or where you work. What tests do I need? Screening Your health care provider may recommend screening tests for certain conditions. This may include: Lipid and cholesterol levels. Diabetes screening. This is done by checking your blood sugar (glucose) after you have not eaten for a while (fasting). Pelvic exam and Pap test. Hepatitis B test. Hepatitis C test. HIV (human immunodeficiency virus) test. STI (sexually transmitted infection) testing, if you are at risk. Lung cancer screening. Colorectal cancer screening. Mammogram. Talk with your health care provider about when you should start having regular mammograms. This may depend on whether you have a family history of breast cancer. BRCA-related cancer screening. This may be done if you have a family history of breast, ovarian, tubal, or peritoneal cancers. Bone density scan. This is done to screen for osteoporosis. Talk with your health care provider about your test results, treatment options, and if necessary, the need for more tests. Follow these instructions at home: Eating and drinking  Eat a diet that includes fresh fruits and vegetables, whole grains, lean protein, and low-fat dairy products. Take vitamin and mineral supplements as recommended by your health care provider. Do not drink alcohol if: Your health care provider tells you not to drink. You are pregnant, may be pregnant, or are planning to become pregnant. If you drink alcohol: Limit how much you have to 0-1 drink a day. Know how much alcohol is in your drink. In the [place] one drink equals one 12 oz bottle of beer (355 mL), one 5 oz glass of wine (148 mL), or one 1 oz glass  of hard liquor (44 mL). Lifestyle Brush your teeth every morning and night with fluoride toothpaste. Floss one time each day. Exercise for at least 30 minutes 5 or more days each week. Do not use any  products that contain nicotine or tobacco. These products include cigarettes, chewing tobacco, and vaping devices, such as e-cigarettes. If you need help quitting, ask your health care provider. Do not use drugs. If you are sexually active, practice safe sex. Use a condom or other form of protection to prevent STIs. If you do not wish to become pregnant, use a form of birth control. If you plan to become pregnant, see your health care provider for a prepregnancy visit. Take aspirin only as told by your health care provider. Make sure that you understand how much to take and what form to take. Work with your health care provider to find out whether it is safe and beneficial for you to take aspirin daily. Find healthy ways to manage stress, such as: Meditation, yoga, or listening to music. Journaling. Talking to a trusted person. Spending time with friends and family. Minimize exposure to UV radiation to reduce your risk of skin cancer. Safety Always wear your seat belt while driving or riding in a vehicle. Do not drive: If you have been drinking alcohol. Do not ride with someone who has been drinking. When you are tired or distracted. While texting. If you have been using any mind-altering substances or drugs. Wear a helmet and other protective equipment during sports activities. If you have firearms in your house, make sure you follow all gun safety procedures. Seek help if you have been physically or sexually abused. What's next? Visit your health care provider once a year for an annual wellness visit. Ask your health care provider how often you should have your eyes and teeth checked. Stay up to date on all vaccines. This information is not intended to replace advice given to you by your health care provider. Make sure you discuss any questions you have with your health care provider. Document Revised: 04/11/2021 Document Reviewed: 04/11/2021 Elsevier Patient Education  2023 [employer].

## 2023-01-31 NOTE — Addendum Note (Signed)
Addended by: Maximino Sarin on: 01/31/2023 03:47 PM   Modules accepted: Orders

## 2023-01-31 NOTE — Progress Notes (Signed)
Subjective:    Patient ID: Salvadore DomCarla Edler, female    DOB: 08/02/1966, 57 y.o.   MRN: [id number]  HPI   Pt in for wellness exam. Pt is not fasting.   Last mammogram-02-2021 per care every where.  Pt is getting pap smear next week.  Pt will get tetanus today. Will get shingrix today as well    Pt has had total knee replacement left side since last saw pt. She states less pain. Also has being seeing healthy wt loss.    Pt has lost 120 lb since started healthy wt loss.   Last follow up from healthy weight loss.   1. Essential hypertension Albin FellingCarla is to continue to increase her water intake and decrease her sodium intake.   2. Pre-diabetes Albin FellingCarla will continue metformin, and we will refill for 1 month.   - metFORMIN (GLUCOPHAGE) 500 MG tablet; 2 po with lunch qd  Dispense: 60 tablet; Refill: 0   3. BMI 40.0-44.9, adult (HCC)   4. Obesity, Beginning BMI 61.01 Albin FellingCarla is currently in the action stage of change. As such, her goal is to continue with weight loss efforts. She has agreed to following a lower carbohydrate, vegetable and lean protein rich diet plan.     Review of Systems  Constitutional:  Negative for chills, fatigue and fever.  HENT:  Negative for congestion and drooling.   Respiratory:  Negative for cough, chest tightness, shortness of breath and wheezing.   Cardiovascular:  Negative for chest pain and palpitations.  Gastrointestinal:  Negative for abdominal pain, anal bleeding and blood in stool.  Genitourinary:  Negative for decreased urine volume and urgency.  Musculoskeletal:  Negative for back pain and joint swelling.  Neurological:  Negative for dizziness, light-headedness and headaches.  Hematological:  Negative for adenopathy.  Psychiatric/Behavioral:  Negative for behavioral problems and confusion.     Past Medical History:  Diagnosis Date   B12 deficiency    Bilateral swelling of feet and ankles    Hypertension    Joint pain    Kidney stones    Knee  pain    Osteoarthritis    Other fatigue    Pre-diabetes    Prediabetes    Sleep apnea    does not use CPAP   Thyroid disease    Vitamin D deficiency      Social History   Socioeconomic History   Marital status: Married    Spouse name: Greig Castillandrew   Number of children: 0   Years of education: Not on file   Highest education level: Not on file  Occupational History   Occupation: Retired  Tobacco Use   Smoking status: Never   Smokeless tobacco: Never  Vaping Use   Vaping Use: Never used  Substance and Sexual Activity   Alcohol use: Not Currently   Drug use: Never   Sexual activity: Yes    Partners: Male  Other Topics Concern   Not on file  Social History Narrative   Not on file   Social Determinants of Health   Financial Resource Strain: Not on file  Food Insecurity: Not on file  Transportation Needs: Not on file  Physical Activity: Not on file  Stress: Not on file  Social Connections: Not on file  Intimate Partner Violence: Not on file    Past Surgical History:  Procedure Laterality Date   COLONOSCOPY WITH PROPOFOL N/A 04/10/2021   Procedure: COLONOSCOPY WITH PROPOFOL;  Surgeon: Lynann BolognaGupta, Rajesh, MD;  Location: [facility];  Service:  Endoscopy;  Laterality: N/A;   KIDNEY STONE SURGERY     miniscus repair Left    POLYPECTOMY  04/10/2021   Procedure: POLYPECTOMY;  Surgeon: Lynann BolognaGupta, Rajesh, MD;  Location: [facility];  Service: Endoscopy;;   SEPTOPLASTY     TOTAL KNEE ARTHROPLASTY Left 11/22/2022   Procedure: LEFT TOTAL KNEE REPLACEMENT;  Surgeon: Tarry KosXu, Naiping M, MD;  Location: [facility];  Service: Orthopedics;  Laterality: Left;    Family History  Problem Relation Age of Onset   Healthy Mother    Obesity Mother    Cancer Father    Obesity Father    Cancer - Other Father    Stroke Maternal Grandmother    Lung cancer Maternal Grandfather     Allergies  Allergen Reactions   Oxycodone     Unmanageable constipation     Current Outpatient Medications on File Prior  to Visit  Medication Sig Dispense Refill   ARMOUR THYROID 120 MG tablet TAKE 1 TABLET (120 MG TOTAL) BY MOUTH DAILY BEFORE BREAKFAST. 30 tablet 0   cetirizine (ZYRTEC) 10 MG tablet Take 10 mg by mouth daily.     estradiol (VIVELLE-DOT) 0.1 MG/24HR patch PLACE 1 PATCH (0.1 MG TOTAL) ONTO THE SKIN 2 (TWO) TIMES A WEEK. 8 patch 0   hydrochlorothiazide (MICROZIDE) 12.5 MG capsule TAKE 1 CAPSULE BY MOUTH EVERY DAY 30 capsule 2   losartan (COZAAR) 100 MG tablet TAKE 1 TABLET BY MOUTH EVERYDAY AT BEDTIME 90 tablet 1   metFORMIN (GLUCOPHAGE) 500 MG tablet 2 po with lunch qd 60 tablet 0   methocarbamol (ROBAXIN-750) 750 MG tablet Take 1 tablet (750 mg total) by mouth 2 (two) times daily as needed for muscle spasms. 20 tablet 2   Multiple Vitamins-Minerals (HAIR SKIN AND NAILS FORMULA) TABS Take 2 tablets by mouth daily.     oxymetazoline (AFRIN) 0.05 % nasal spray Place 2-3 sprays into both nostrils at bedtime.     Phentermine HCl (LOMAIRA) 8 MG TABS Take 8 mg by mouth every morning. Before 1st meal of the day. 30 tablet 0   progesterone (PROMETRIUM) 100 MG capsule Take 1 capsule (100 mg total) by mouth at bedtime. 90 capsule 3   No current facility-administered medications on file prior to visit.    BP 120/66   Pulse 80   Resp 18   Ht 5\' 7"  (1.702 m)   Wt 290 lb (131.5 kg)   SpO2 95%   BMI 45.42 kg/m        Objective:   Physical Exam  General Mental Status- Alert. General Appearance- Not in acute distress.   Skin General: Color- Normal Color. Moisture- Normal Moisture.  Neck Carotid Arteries- Normal color. Moisture- Normal Moisture. No carotid bruits. No JVD.  Chest and Lung Exam Auscultation: Breath Sounds:-Normal.  Cardiovascular Auscultation:Rythm- Regular. Murmurs & Other Heart Sounds:Auscultation of the heart reveals- No Murmurs.  Abdomen Inspection:-Inspeection Normal. Palpation/Percussion:Note:No mass. Palpation and Percussion of the abdomen reveal- Non Tender, Non  Distended + BS, no rebound or guarding.   Neurologic Cranial Nerve exam:- CN III-XII intact(No nystagmus), symmetric smile. Strength:- 5/5 equal and symmetric strength both upper and lower extremities.       Assessment & Plan:   Patient Instructions  For you wellness exam today I have ordered cbc, cmp and lipid panel. Get scheduled future fasting in am.  Will include A1c on future lab as well.  Please update me when you get mammogram so I can update maintenance  Vaccine given today. Shingrix and  Td.  Recommend exercise and healthy diet.  We will let you know lab results as they come in.  Follow up date appointment will be determined after lab review.    Great job on weight loss.       Esperanza Richters, PA-C

## 2023-02-02 ENCOUNTER — Encounter: Payer: Self-pay | Admitting: Medical

## 2023-02-03 ENCOUNTER — Ambulatory Visit (INDEPENDENT_AMBULATORY_CARE_PROVIDER_SITE_OTHER): Payer: BC Managed Care – PPO | Admitting: Obstetrics & Gynecology

## 2023-02-03 ENCOUNTER — Encounter: Payer: Self-pay | Admitting: Obstetrics & Gynecology

## 2023-02-03 ENCOUNTER — Other Ambulatory Visit (HOSPITAL_COMMUNITY)
Admission: RE | Admit: 2023-02-03 | Discharge: 2023-02-03 | Disposition: A | Discharge status: Home / Self Care | Payer: BC Managed Care – PPO | Source: Ambulatory Visit | Attending: Obstetrics & Gynecology | Admitting: Obstetrics & Gynecology

## 2023-02-03 VITALS — BP 148/86 | HR 82 | Ht 67.0 in | Wt 286.0 lb

## 2023-02-03 DIAGNOSIS — Z113 Encounter for screening for infections with a predominantly sexual mode of transmission: Secondary | ICD-10-CM | POA: Diagnosis not present

## 2023-02-03 DIAGNOSIS — Z01419 Encounter for gynecological examination (general) (routine) without abnormal findings: Secondary | ICD-10-CM

## 2023-02-03 DIAGNOSIS — Z1231 Encounter for screening mammogram for malignant neoplasm of breast: Secondary | ICD-10-CM

## 2023-02-03 MED ORDER — METHOCARBAMOL 750 MG PO TABS: 750.0000 mg | ORAL_TABLET | Freq: Two times a day (BID) | ORAL | 2 refills | Status: DC | PRN

## 2023-02-03 NOTE — Progress Notes (Signed)
WELL-WOMAN EXAMINATION Patient name: Laura Knox MRN [id number]  Date of birth: [birth year] Chief Complaint:   Annual Exam  History of Present Illness:   Laura Knox is a 57 y.o. G0P0000 PM female being seen today for a routine well-woman exam.    No bleeding discharge itching or irritation.  Denies pelvic or abdominal pain.  Currently on HRT and this has been working well for her however mom she reports 1 episode of a night sweat each night on a scale of 1-10 she rates her symptoms a 5.  Reports no other acute complaints  Sexually active with new partner and desires STI screening   No LMP recorded. Patient is postmenopausal. Denies issues with her menses The current method of family planning is  n/a .    Last pap 01/2022.  Last mammogram: ordered. Last colonoscopy: 2022     01/31/2023    2:47 PM 12/27/2020    7:34 AM  Depression screen PHQ 2/9  Decreased Interest 0 3  Down, Depressed, Hopeless 0 2  PHQ - 2 Score 0 5  Altered sleeping  3  Tired, decreased energy  3  Change in appetite  3  Feeling bad or failure about yourself   3  Trouble concentrating  0  Moving slowly or fidgety/restless  3  Suicidal thoughts  0  PHQ-9 Score  20  Difficult doing work/chores  Not difficult at all      Review of Systems:   Pertinent items are noted in HPI Denies any headaches, blurred vision, fatigue, shortness of breath, chest pain, abdominal pain, bowel movements, urination, or intercourse unless otherwise stated above.  Pertinent History Reviewed:  Reviewed past medical,surgical, social and family history.  Reviewed problem list, medications and allergies. Physical Assessment:   Vitals:   02/03/23 1324 02/03/23 1344  BP: (!) 163/97 (!) 148/86  Pulse: 91 82  Weight: 286 lb (129.7 kg)   Height: 5\' 7"  (1.702 m)   Body mass index is 44.79 kg/m.        Physical Examination:   General appearance - well appearing, and in no distress  Mental status - alert, oriented to person,  place, and time  Psych:  She has a normal mood and affect  Skin - warm and dry, normal color, no suspicious lesions noted  Chest - effort normal, all lung fields clear to auscultation bilaterally  Heart - normal rate and regular rhythm  Neck:  midline trachea, no thyromegaly or nodules  Breasts - breasts appear normal, no suspicious masses, no skin or nipple changes or  axillary nodes  Abdomen - obese, soft, nontender, nondistended, no masses or organomegaly  Pelvic - pendulous mons noted, VULVA: normal appearing vulva with no masses, tenderness or lesions  VAGINA: normal appearing vagina with normal color and discharge, no lesions  CERVIX: normal appearing cervix without discharge or lesions, no CMT  UTERUS: uterus is felt to be normal size, shape, consistency and nontender - bimanual exam limited due to body habitus  Extremities:  No swelling or varicosities noted  Chaperone:  deanna sole      Assessment & Plan:  1) Well-Woman Exam Pap up-to-date reviewed screening guidelines Mammogram ordered today STI screening to be completed  2) night sweats Reviewed over-the-counter options such as magnesium lotion Continue with current HRT, []  consider adjusting pending symptoms  Orders Placed This Encounter  Procedures   MM Digital Screening   RPR   HIV Antibody (routine testing w rflx)    Meds: No  orders of the defined types were placed in this encounter.   Follow-up: Return in about 1 year (around 02/03/2024) for Annual.   Myna Hidalgo, DO Attending Obstetrician & Gynecologist, [facility] for [facility], [facility]

## 2023-02-03 NOTE — Addendum Note (Signed)
Addended by: Gwenevere Abbot on: 02/03/2023 12:43 PM   Modules accepted: Orders

## 2023-02-04 ENCOUNTER — Telehealth: Payer: Self-pay | Admitting: *Deleted

## 2023-02-04 ENCOUNTER — Other Ambulatory Visit (INDEPENDENT_AMBULATORY_CARE_PROVIDER_SITE_OTHER): Payer: BC Managed Care – PPO

## 2023-02-04 DIAGNOSIS — Z113 Encounter for screening for infections with a predominantly sexual mode of transmission: Secondary | ICD-10-CM

## 2023-02-04 DIAGNOSIS — R739 Hyperglycemia, unspecified: Secondary | ICD-10-CM | POA: Diagnosis not present

## 2023-02-04 DIAGNOSIS — Z0000 Encounter for general adult medical examination without abnormal findings: Secondary | ICD-10-CM

## 2023-02-04 DIAGNOSIS — R7989 Other specified abnormal findings of blood chemistry: Secondary | ICD-10-CM

## 2023-02-04 DIAGNOSIS — E039 Hypothyroidism, unspecified: Secondary | ICD-10-CM | POA: Diagnosis not present

## 2023-02-04 LAB — COMPREHENSIVE METABOLIC PANEL
ALT: 15 U/L (ref 0–35)
AST: 13 U/L (ref 0–37)
Albumin: 3.9 g/dL (ref 3.5–5.2)
Alkaline Phosphatase: 93 U/L (ref 39–117)
BUN: 20 mg/dL (ref 6–23)
CO2: 29 mEq/L (ref 19–32)
Calcium: 9.1 mg/dL (ref 8.4–10.5)
Chloride: 103 mEq/L (ref 96–112)
Creatinine, Ser: 0.63 mg/dL (ref 0.40–1.20)
GFR: 98.18 mL/min (ref >60.00)
Glucose, Bld: 105 mg/dL — ABNORMAL HIGH (ref 70–99)
Potassium: 4 mEq/L (ref 3.5–5.1)
Sodium: 141 mEq/L (ref 135–145)
Total Bilirubin: 0.4 mg/dL (ref 0.2–1.2)
Total Protein: 6.5 g/dL (ref 6.0–8.3)

## 2023-02-04 LAB — LIPID PANEL
Cholesterol: 120 mg/dL (ref 0–200)
HDL: 44.6 mg/dL (ref >39.00)
LDL Cholesterol: 65 mg/dL (ref 0–99)
NonHDL: 75.23
Total CHOL/HDL Ratio: 3
Triglycerides: 53 mg/dL (ref 0.0–149.0)
VLDL: 10.6 mg/dL (ref 0.0–40.0)

## 2023-02-04 LAB — TSH: TSH: 0.22 u[IU]/mL — ABNORMAL LOW (ref 0.35–5.50)

## 2023-02-04 LAB — CBC WITH DIFFERENTIAL/PLATELET
Basophils Absolute: 0 10*3/uL (ref 0.0–0.1)
Basophils Relative: 0.7 % (ref 0.0–3.0)
Eosinophils Absolute: 0.1 10*3/uL (ref 0.0–0.7)
Eosinophils Relative: 2.6 % (ref 0.0–5.0)
HCT: 39.9 % (ref 36.0–46.0)
Hemoglobin: 13.5 g/dL (ref 12.0–15.0)
Lymphocytes Relative: 22.7 % (ref 12.0–46.0)
Lymphs Abs: 1.2 10*3/uL (ref 0.7–4.0)
MCHC: 33.8 g/dL (ref 30.0–36.0)
MCV: 86 fl (ref 78.0–100.0)
Monocytes Absolute: 0.5 10*3/uL (ref 0.1–1.0)
Monocytes Relative: 9.2 % (ref 3.0–12.0)
Neutro Abs: 3.5 10*3/uL (ref 1.4–7.7)
Neutrophils Relative %: 64.8 % (ref 43.0–77.0)
Platelets: 308 10*3/uL (ref 150.0–400.0)
RBC: 4.64 Mil/uL (ref 3.87–5.11)
RDW: 13.8 % (ref 11.5–15.5)
WBC: 5.5 10*3/uL (ref 4.0–10.5)

## 2023-02-04 LAB — CERVICOVAGINAL ANCILLARY ONLY
Chlamydia: NEGATIVE
Comment: NEGATIVE
Comment: NEGATIVE
Comment: NORMAL
Neisseria Gonorrhea: NEGATIVE
Trichomonas: NEGATIVE

## 2023-02-04 LAB — HEMOGLOBIN A1C: Hgb A1c MFr Bld: 5.5 % (ref 4.6–6.5)

## 2023-02-04 LAB — T4, FREE: Free T4: 0.83 ng/dL (ref 0.60–1.60)

## 2023-02-04 NOTE — Telephone Encounter (Signed)
Orders placed per GYN dx code on order.

## 2023-02-04 NOTE — Telephone Encounter (Signed)
Pt came in to complete labs from PCP.  She brings a [facility] order for RPR and HIV from GYN.  I explained that we are not able to do labs from [facility] outside of [place] but I would ask PCP if he would be willing to order labs under his name?  Please advise if ok to do testing under your name and if so I will place orders and let pt know the outcome.

## 2023-02-05 DIAGNOSIS — M25562 Pain in left knee: Secondary | ICD-10-CM | POA: Diagnosis not present

## 2023-02-05 DIAGNOSIS — Z96652 Presence of left artificial knee joint: Secondary | ICD-10-CM | POA: Diagnosis not present

## 2023-02-05 LAB — HIV ANTIBODY (ROUTINE TESTING W REFLEX): HIV Screen 4th Generation wRfx: NONREACTIVE

## 2023-02-05 LAB — RPR: RPR Ser Ql: NONREACTIVE

## 2023-02-10 ENCOUNTER — Encounter: Payer: Self-pay | Admitting: *Deleted

## 2023-02-12 DIAGNOSIS — M25562 Pain in left knee: Secondary | ICD-10-CM | POA: Diagnosis not present

## 2023-02-12 DIAGNOSIS — Z96652 Presence of left artificial knee joint: Secondary | ICD-10-CM | POA: Diagnosis not present

## 2023-02-14 ENCOUNTER — Other Ambulatory Visit (INDEPENDENT_AMBULATORY_CARE_PROVIDER_SITE_OTHER): Payer: BC Managed Care – PPO

## 2023-02-14 ENCOUNTER — Encounter: Payer: Self-pay | Admitting: Orthopaedic Surgery

## 2023-02-14 ENCOUNTER — Ambulatory Visit: Payer: BC Managed Care – PPO | Admitting: Orthopaedic Surgery

## 2023-02-14 DIAGNOSIS — M1711 Unilateral primary osteoarthritis, right knee: Secondary | ICD-10-CM | POA: Diagnosis not present

## 2023-02-14 DIAGNOSIS — Z96652 Presence of left artificial knee joint: Secondary | ICD-10-CM

## 2023-02-14 NOTE — Progress Notes (Signed)
Post-Op Visit Note   Patient: Laura Knox           Date of Birth: 1965-12-03           MRN: [id number] Visit Date: 02/14/2023 PCP: Esperanza Richters, PA-C   Assessment & Plan:  Chief Complaint:  Chief Complaint  Patient presents with   Left Knee - Follow-up    Left total knee arthroplasty 11/22/2022   Visit Diagnoses:  1. Status post total left knee replacement     Plan: Patient is a pleasant 57 year old female who comes in today 3 months status post left total knee replacement 11/22/2022.  She has been doing relatively well.  She is still in physical therapy and has started back at the gym.  She thinks she may have overdone it earlier this week and is now having increased pain not only to the left knee but to the right knee where she also has underlying arthritis.  Examination of the left knee reveals range of motion from 0 to 105 degrees.  She is stable to valgus varus stress.  She is neurovascular intact distally.  At this point, she will continue to push things with range of motion.  She will follow-up with [place] in 3 months for recheck of the left knee.  Dental prophylaxis reinforced.  In regards to the right knee, we saw her for this back in December.  she would like to go ahead and schedule this for late June as her brother will be able to stay with her during the postoperative period.  Follow-Up Instructions: Return in about 3 months (around 05/16/2023).   Orders:  Orders Placed This Encounter  Procedures   XR KNEE 3 VIEW RIGHT   No orders of the defined types were placed in this encounter.   Imaging: No new imaging  PMFS History: Patient Active Problem List   Diagnosis Date Noted   Depression 12/24/2022   BMI 40.0-44.9, adult 12/24/2022   Obesity, Beginning BMI 61.01 12/24/2022   OA (osteoarthritis) of knee 11/22/2022   Status post total left knee replacement 11/22/2022   Primary osteoarthritis of left knee 11/21/2022   Left knee pain 10/14/2022   Osteoarthritis of  both knees 07/22/2022   Achilles tendinitis 03/21/2022   Pre-diabetes 02/27/2022   Vitamin D deficiency 02/27/2022   At risk for diabetes mellitus 02/27/2022   Plantar fasciitis of right foot 01/08/2022   Special screening for malignant neoplasms, colon    Rectal polyp    Essential hypertension 01/29/2021   Class 3 severe obesity with serious comorbidity and body mass index (BMI) of 60.0 to 69.9 in adult 01/29/2021   Decreased functional activity tolerance 07/24/2020   Cervical spondylosis 07/24/2020   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   Obstructive sleep apnea syndrome 07/20/2015   Arthritis 01/06/2014   Past Medical History:  Diagnosis Date   B12 deficiency    Bilateral swelling of feet and ankles    Hypertension    Joint pain    Kidney stones    Knee pain    Osteoarthritis    Other fatigue    Pre-diabetes    Prediabetes    Sleep apnea    does not use CPAP   Thyroid disease    Vitamin D deficiency     Family History  Problem Relation Age of Onset   Healthy Mother    Obesity Mother    Cancer Father    Obesity Father    Cancer - Other Father    Stroke Maternal  Grandmother    Lung cancer Maternal Grandfather     Past Surgical History:  Procedure Laterality Date   COLONOSCOPY WITH PROPOFOL N/A 04/10/2021   Procedure: COLONOSCOPY WITH PROPOFOL;  Surgeon: Lynann Bologna, MD;  Location: [facility];  Service: Endoscopy;  Laterality: N/A;   KIDNEY STONE SURGERY     miniscus repair Left    POLYPECTOMY  04/10/2021   Procedure: POLYPECTOMY;  Surgeon: Lynann Bologna, MD;  Location: [facility];  Service: Endoscopy;;   SEPTOPLASTY     TOTAL KNEE ARTHROPLASTY Left 11/22/2022   Procedure: LEFT TOTAL KNEE REPLACEMENT;  Surgeon: Tarry Kos, MD;  Location: [facility];  Service: Orthopedics;  Laterality: Left;   Social History   Occupational History   Occupation: Retired  Tobacco Use   Smoking status: Never   Smokeless tobacco: Never  Vaping Use   Vaping Use: Never used  Substance  and Sexual Activity   Alcohol use: Not Currently   Drug use: Never   Sexual activity: Yes    Partners: Male

## 2023-02-19 ENCOUNTER — Encounter (INDEPENDENT_AMBULATORY_CARE_PROVIDER_SITE_OTHER): Payer: Self-pay | Admitting: Family Medicine

## 2023-02-19 ENCOUNTER — Ambulatory Visit (INDEPENDENT_AMBULATORY_CARE_PROVIDER_SITE_OTHER): Payer: BC Managed Care – PPO | Admitting: Family Medicine

## 2023-02-19 VITALS — BP 161/74 | HR 91 | Temp 97.8°F | Ht 67.0 in | Wt 278.0 lb

## 2023-02-19 DIAGNOSIS — Z6841 Body Mass Index (BMI) 40.0 and over, adult: Secondary | ICD-10-CM

## 2023-02-19 DIAGNOSIS — R7303 Prediabetes: Secondary | ICD-10-CM | POA: Diagnosis not present

## 2023-02-19 DIAGNOSIS — E669 Obesity, unspecified: Secondary | ICD-10-CM | POA: Diagnosis not present

## 2023-02-19 DIAGNOSIS — Z96652 Presence of left artificial knee joint: Secondary | ICD-10-CM | POA: Diagnosis not present

## 2023-02-19 DIAGNOSIS — I10 Essential (primary) hypertension: Secondary | ICD-10-CM

## 2023-02-19 DIAGNOSIS — M25562 Pain in left knee: Secondary | ICD-10-CM | POA: Diagnosis not present

## 2023-02-19 MED ORDER — LOMAIRA 8 MG PO TABS: 8.0000 mg | ORAL_TABLET | Freq: Every morning | ORAL | 0 refills | Status: DC

## 2023-02-19 MED ORDER — CHLORTHALIDONE 25 MG PO TABS
25.0000 mg | ORAL_TABLET | Freq: Every day | ORAL | 0 refills | Status: DC
Start: 2023-02-19 — End: 2023-03-12

## 2023-02-19 NOTE — Progress Notes (Unsigned)
Chief Complaint:   OBESITY Laura Knox is here to discuss her progress with her obesity treatment plan along with follow-up of her obesity related diagnoses. Laura Knox is on following a lower carbohydrate, vegetable and lean protein rich diet plan and states she is following her eating plan approximately 60% of the time. Laura Knox states she is doing water exercises for 8 hours per week.    Today's visit was #: 36 Starting weight: 378 lbs Starting date: 12/27/2020 Today's weight: 278 lbs Today's date: 02/19/2023 Total lbs lost to date: 100 Total lbs lost since last in-office visit: 4  Interim History: Laura Knox continues to work on her diet and exercise, and she is doing well with her weight loss. Her hunger is mostly controlled on Lomaira, but her blood pressure has increased.   Subjective:   1. Essential hypertension Laura Knox's blood pressure remains elevated on hydrochlorothiazide and Cozaar. It was elevated last visit as well. She is on a low dose Lomaira, and so it is espeically important that her blood pressure is well controlled.   2. Pre-diabetes Laura Knox is working on her diet and she has decreased simple carbohydrates significantly. She has no problems with metformin.   Assessment/Plan:   1. Essential hypertension Lisset will continue losartan at 100 mg, and stop hydrochlorothiazide; She is to start chlorthalidone 25 mg once daily with no refills. We will recheck her blood pressure in 3 weeks.   - chlorthalidone (HYGROTON) 25 MG tablet; Take 1 tablet (25 mg total) by mouth daily.  Dispense: 30 tablet; Refill: 0  2. Pre-diabetes Laura Knox will continue metformin 500 mg 2 tablets once daily with lunch, and she will continue with her diet and exercise.   3. BMI 40.0-44.9, adult - Phentermine HCl (LOMAIRA) 8 MG TABS; Take 1 tablet (8 mg total) by mouth every morning. Before 1st meal of the day.  Dispense: 30 tablet; Refill: 0  4. Obesity, Beginning BMI 61.01 We will refill Lomaira for 1 month, and  will work on getting her blood pressure under better control.   - Phentermine HCl (LOMAIRA) 8 MG TABS; Take 1 tablet (8 mg total) by mouth every morning. Before 1st meal of the day.  Dispense: 30 tablet; Refill: 0  Laura Knox is currently in the action stage of change. As such, her goal is to continue with weight loss efforts. She has agreed to following a lower carbohydrate, vegetable and lean protein rich diet plan.   Exercise goals: As is.   Behavioral modification strategies: increasing lean protein intake and increasing water intake.  Laura Knox has agreed to follow-up with our clinic in 3 weeks. She was informed of the importance of frequent follow-up visits to maximize her success with intensive lifestyle modifications for her multiple health conditions.   Objective:   Blood pressure (!) 161/74, pulse 91, temperature 97.8 F (36.6 C), height  (1.702 m), weight 278 lb (126.1 kg), SpO2 97 %. Body mass index is 43.54 kg/m.  Lab Results  Component Value Date   CREATININE 0.63 02/04/2023   BUN 20 02/04/2023   NA 141 02/04/2023   K 4.0 02/04/2023   CL 103 02/04/2023   CO2 29 02/04/2023   Lab Results  Component Value Date   ALT 15 02/04/2023   AST 13 02/04/2023   ALKPHOS 93 02/04/2023   BILITOT 0.4 02/04/2023   Lab Results  Component Value Date   HGBA1C 5.5 02/04/2023   HGBA1C 5.8 (H) 08/12/2022   HGBA1C 5.6 02/06/2022   HGBA1C 5.9 (H)  06/18/2021   HGBA1C 5.9 02/06/2021   Lab Results  Component Value Date   INSULIN 7.4 08/12/2022   INSULIN 16.7 06/18/2021   INSULIN 20.4 12/27/2020   Lab Results  Component Value Date   TSH 0.22 (L) 02/04/2023   Lab Results  Component Value Date   CHOL 120 02/04/2023   HDL 44.60 02/04/2023   LDLCALC 65 02/04/2023   TRIG 53.0 02/04/2023   CHOLHDL 3 02/04/2023   Lab Results  Component Value Date   VD25OH 41.3 08/12/2022   VD25OH 46.7 02/06/2022   VD25OH 76.3 06/18/2021   Lab Results  Component Value Date   WBC 5.5  02/04/2023   HGB 13.5 02/04/2023   HCT 39.9 02/04/2023   MCV 86.0 02/04/2023   PLT 308.0 02/04/2023   Lab Results  Component Value Date   FERRITIN 88 02/06/2021   Attestation Statements:   Reviewed by clinician on day of visit: allergies, medications, problem list, medical history, surgical history, family history, social history, and previous encounter notes.  I have personally spent 44 minutes total time today in preparation, patient care, and documentation for this visit, including the following: review of clinical lab tests; review of medical tests/procedures/services.   I, Burt Knack, am acting as transcriptionist for Quillian Quince, MD.  I have reviewed the above documentation for accuracy and completeness, and I agree with the above. -  Quillian Quince, MD

## 2023-02-21 ENCOUNTER — Encounter (INDEPENDENT_AMBULATORY_CARE_PROVIDER_SITE_OTHER): Payer: Self-pay | Admitting: Family Medicine

## 2023-02-21 ENCOUNTER — Encounter: Payer: Self-pay | Admitting: Orthopaedic Surgery

## 2023-02-21 ENCOUNTER — Other Ambulatory Visit: Payer: Self-pay | Admitting: Physician Assistant

## 2023-02-21 MED ORDER — TRAMADOL HCL 50 MG PO TABS: 50.0000 mg | ORAL_TABLET | Freq: Three times a day (TID) | ORAL | 2 refills | Status: DC | PRN

## 2023-02-21 NOTE — Telephone Encounter (Signed)
Just sent

## 2023-02-23 ENCOUNTER — Other Ambulatory Visit: Payer: Self-pay | Admitting: Obstetrics and Gynecology

## 2023-02-23 DIAGNOSIS — Z79890 Hormone replacement therapy (postmenopausal): Secondary | ICD-10-CM

## 2023-02-26 DIAGNOSIS — M25562 Pain in left knee: Secondary | ICD-10-CM | POA: Diagnosis not present

## 2023-02-26 DIAGNOSIS — Z96652 Presence of left artificial knee joint: Secondary | ICD-10-CM | POA: Diagnosis not present

## 2023-03-02 ENCOUNTER — Other Ambulatory Visit: Payer: Self-pay | Admitting: Medical

## 2023-03-02 ENCOUNTER — Other Ambulatory Visit (INDEPENDENT_AMBULATORY_CARE_PROVIDER_SITE_OTHER): Payer: Self-pay | Admitting: Family Medicine

## 2023-03-02 DIAGNOSIS — R7303 Prediabetes: Secondary | ICD-10-CM

## 2023-03-06 DIAGNOSIS — M25562 Pain in left knee: Secondary | ICD-10-CM | POA: Diagnosis not present

## 2023-03-06 DIAGNOSIS — Z96652 Presence of left artificial knee joint: Secondary | ICD-10-CM | POA: Diagnosis not present

## 2023-03-07 ENCOUNTER — Other Ambulatory Visit (INDEPENDENT_AMBULATORY_CARE_PROVIDER_SITE_OTHER): Payer: Self-pay | Admitting: Family Medicine

## 2023-03-07 DIAGNOSIS — I10 Essential (primary) hypertension: Secondary | ICD-10-CM

## 2023-03-10 ENCOUNTER — Encounter: Payer: Self-pay | Admitting: Orthopaedic Surgery

## 2023-03-11 DIAGNOSIS — M25562 Pain in left knee: Secondary | ICD-10-CM | POA: Diagnosis not present

## 2023-03-11 DIAGNOSIS — Z96652 Presence of left artificial knee joint: Secondary | ICD-10-CM | POA: Diagnosis not present

## 2023-03-12 ENCOUNTER — Ambulatory Visit (INDEPENDENT_AMBULATORY_CARE_PROVIDER_SITE_OTHER): Payer: BC Managed Care – PPO | Admitting: Family Medicine

## 2023-03-12 ENCOUNTER — Encounter (INDEPENDENT_AMBULATORY_CARE_PROVIDER_SITE_OTHER): Payer: Self-pay | Admitting: Family Medicine

## 2023-03-12 ENCOUNTER — Ambulatory Visit (INDEPENDENT_AMBULATORY_CARE_PROVIDER_SITE_OTHER): Payer: BC Managed Care – PPO

## 2023-03-12 VITALS — BP 116/76 | HR 98 | Temp 98.3°F | Ht 67.0 in | Wt 268.0 lb

## 2023-03-12 DIAGNOSIS — M25561 Pain in right knee: Secondary | ICD-10-CM

## 2023-03-12 DIAGNOSIS — Z1231 Encounter for screening mammogram for malignant neoplasm of breast: Secondary | ICD-10-CM

## 2023-03-12 DIAGNOSIS — E669 Obesity, unspecified: Secondary | ICD-10-CM

## 2023-03-12 DIAGNOSIS — N393 Stress incontinence (female) (male): Secondary | ICD-10-CM | POA: Diagnosis not present

## 2023-03-12 DIAGNOSIS — R632 Polyphagia: Secondary | ICD-10-CM | POA: Diagnosis not present

## 2023-03-12 DIAGNOSIS — R7303 Prediabetes: Secondary | ICD-10-CM

## 2023-03-12 DIAGNOSIS — I10 Essential (primary) hypertension: Secondary | ICD-10-CM

## 2023-03-12 DIAGNOSIS — Z6841 Body Mass Index (BMI) 40.0 and over, adult: Secondary | ICD-10-CM

## 2023-03-12 MED ORDER — LOMAIRA 8 MG PO TABS: 8.0000 mg | ORAL_TABLET | Freq: Every morning | ORAL | 0 refills | Status: DC

## 2023-03-12 MED ORDER — METFORMIN HCL 500 MG PO TABS: ORAL_TABLET | ORAL | 0 refills | Status: DC

## 2023-03-12 MED ORDER — CHLORTHALIDONE 25 MG PO TABS: 25.0000 mg | ORAL_TABLET | Freq: Every day | ORAL | 0 refills | Status: DC

## 2023-03-17 NOTE — Progress Notes (Unsigned)
Chief Complaint:   OBESITY Laura Knox is here to discuss her progress with her obesity treatment plan along with follow-up of her obesity related diagnoses. Laura Knox is on following a lower carbohydrate, vegetable and lean protein rich diet plan and states she is following her eating plan approximately 50% of the time. Laura Knox states she is water exercise and weights for 120 minutes 6 times per week.  Today's visit was #: 37 Starting weight: 378 lbs Starting date: 12/27/2020 Today's weight: 268 lbs Today's date: 03/12/2023 Total lbs lost to date: 110 Total lbs lost since last in-office visit: 10  Interim History: Laura Knox has done very well with her weight loss.  Her exercise has significantly increased.  She notes her hunger is controlled, and she is doing well with portion control and increasing her protein and decreasing simple carbohydrates.  Subjective:   1. Right knee pain, unspecified chronicity Laura Knox is exercising with water exercises.  She is getting ready to have a knee replacement next month.  2. Essential hypertension Laura Knox's blood pressure is well-controlled today.  No side effects were noted with chlorthalidone.  She is doing very well with her diet and exercise.  3. Polyphagia Laura Knox notes polyphagia has improved on phentermine.  Her blood pressure is controlled and she has no palpitations or chest pain.  4. Stress bladder incontinence, female Laura Knox has a history of cough recently and she notes some stress incontinence only with coughing, but not with sneezing or 40.  This happens even directly after using the bathroom.  She denies a history of childbirth.  5. Pre-diabetes Laura Knox is doing well on metformin and with her diet and exercise.  She denies nausea or vomiting.  Assessment/Plan:   1. Right knee pain, unspecified chronicity Laura Knox was encouraged to continue with her exercise and physical therapy, as well as weight loss to decrease surgical complications.  2. Essential  hypertension Laura Knox will continue chlorthalidone at 25 mg daily, and we will refill for 1 month.  - chlorthalidone (HYGROTON) 25 MG tablet; Take 1 tablet (25 mg total) by mouth daily.  Dispense: 30 tablet; Refill: 0  3. Polyphagia Laura Knox will continue her medications, and we will refill phentermine at 8 mg daily for 1 month.  - Phentermine HCl (LOMAIRA) 8 MG TABS; Take 1 tablet (8 mg total) by mouth every morning. Before 1st meal of the day.  Dispense: 30 tablet; Refill: 0  4. Stress bladder incontinence, female Laura Knox was educated about symptoms and which products to use to keep her more comfortable.  She was offered a urology referral if her symptoms continue after cough resolves, and she will consider this.   5. Pre-diabetes We will refill metformin at 500 mg twice daily for 1 month.  Laura Knox will continue to work on her diet and weight loss.  - metFORMIN (GLUCOPHAGE) 500 MG tablet; 2 po with lunch qd  Dispense: 60 tablet; Refill: 0  6. Obesity, Beginning BMI 61.01 - Phentermine HCl (LOMAIRA) 8 MG TABS; Take 1 tablet (8 mg total) by mouth every morning. Before 1st meal of the day.  Dispense: 30 tablet; Refill: 0  7. BMI 40.0-44.9, adult (HCC) - Phentermine HCl (LOMAIRA) 8 MG TABS; Take 1 tablet (8 mg total) by mouth every morning. Before 1st meal of the day.  Dispense: 30 tablet; Refill: 0  Laura Knox is currently in the action stage of change. As such, her goal is to continue with weight loss efforts. She has agreed to practicing portion control and making smarter  food choices, such as increasing vegetables and decreasing simple carbohydrates.   Exercise goals: As is.   Behavioral modification strategies: increasing lean protein intake and decreasing simple carbohydrates.  Laura Knox has agreed to follow-up with our clinic in 3 to 4 weeks. She was informed of the importance of frequent follow-up visits to maximize her success with intensive lifestyle modifications for her multiple health  conditions.   Objective:   Blood pressure 116/76, pulse 98, temperature 98.3 F (36.8 C), height 5\' 7"  (1.702 m), weight 268 lb (121.6 kg), SpO2 94 %. Body mass index is 41.97 kg/m.  Lab Results  Component Value Date   CREATININE 0.63 02/04/2023   BUN 20 02/04/2023   NA 141 02/04/2023   K 4.0 02/04/2023   CL 103 02/04/2023   CO2 29 02/04/2023   Lab Results  Component Value Date   ALT 15 02/04/2023   AST 13 02/04/2023   ALKPHOS 93 02/04/2023   BILITOT 0.4 02/04/2023   Lab Results  Component Value Date   HGBA1C 5.5 02/04/2023   HGBA1C 5.8 (H) 08/12/2022   HGBA1C 5.6 02/06/2022   HGBA1C 5.9 (H) 06/18/2021   HGBA1C 5.9 02/06/2021   Lab Results  Component Value Date   INSULIN 7.4 08/12/2022   INSULIN 16.7 06/18/2021   INSULIN 20.4 12/27/2020   Lab Results  Component Value Date   TSH 0.22 (L) 02/04/2023   Lab Results  Component Value Date   CHOL 120 02/04/2023   HDL 44.60 02/04/2023   LDLCALC 65 02/04/2023   TRIG 53.0 02/04/2023   CHOLHDL 3 02/04/2023   Lab Results  Component Value Date   VD25OH 41.3 08/12/2022   VD25OH 46.7 02/06/2022   VD25OH 76.3 06/18/2021   Lab Results  Component Value Date   WBC 5.5 02/04/2023   HGB 13.5 02/04/2023   HCT 39.9 02/04/2023   MCV 86.0 02/04/2023   PLT 308.0 02/04/2023   Lab Results  Component Value Date   FERRITIN 88 02/06/2021   Attestation Statements:   Reviewed by clinician on day of visit: allergies, medications, problem list, medical history, surgical history, family history, social history, and previous encounter notes.  I have personally spent 40 minutes total time today in preparation, patient care, and documentation for this visit, including the following: review of clinical lab tests; review of medical tests/procedures/services.   I, Burt Knack, am acting as transcriptionist for Quillian Quince, MD.  I have reviewed the above documentation for accuracy and completeness, and I agree with the above. -   Quillian Quince, MD

## 2023-03-18 DIAGNOSIS — M25562 Pain in left knee: Secondary | ICD-10-CM | POA: Diagnosis not present

## 2023-03-18 DIAGNOSIS — Z96652 Presence of left artificial knee joint: Secondary | ICD-10-CM | POA: Diagnosis not present

## 2023-03-22 ENCOUNTER — Other Ambulatory Visit: Payer: Self-pay | Admitting: Medical

## 2023-03-22 DIAGNOSIS — I10 Essential (primary) hypertension: Secondary | ICD-10-CM

## 2023-04-02 ENCOUNTER — Ambulatory Visit: Payer: BC Managed Care – PPO

## 2023-04-02 DIAGNOSIS — M170 Bilateral primary osteoarthritis of knee: Secondary | ICD-10-CM | POA: Diagnosis not present

## 2023-04-03 LAB — HEMOGLOBIN A1C
Hgb A1c MFr Bld: 5.7 % of total Hgb — ABNORMAL HIGH (ref <5.7)
Mean Plasma Glucose: 117 mg/dL
eAG (mmol/L): 6.5 mmol/L

## 2023-04-03 LAB — PREALBUMIN: Prealbumin: 19 mg/dL (ref 17–34)

## 2023-04-08 ENCOUNTER — Other Ambulatory Visit (INDEPENDENT_AMBULATORY_CARE_PROVIDER_SITE_OTHER): Payer: Self-pay | Admitting: Family Medicine

## 2023-04-08 DIAGNOSIS — R7303 Prediabetes: Secondary | ICD-10-CM

## 2023-04-09 ENCOUNTER — Ambulatory Visit (INDEPENDENT_AMBULATORY_CARE_PROVIDER_SITE_OTHER): Payer: BC Managed Care – PPO | Admitting: Family Medicine

## 2023-04-09 ENCOUNTER — Encounter (INDEPENDENT_AMBULATORY_CARE_PROVIDER_SITE_OTHER): Payer: Self-pay | Admitting: Family Medicine

## 2023-04-09 DIAGNOSIS — R7303 Prediabetes: Secondary | ICD-10-CM

## 2023-04-09 DIAGNOSIS — E669 Obesity, unspecified: Secondary | ICD-10-CM | POA: Diagnosis not present

## 2023-04-09 DIAGNOSIS — R632 Polyphagia: Secondary | ICD-10-CM

## 2023-04-09 DIAGNOSIS — I10 Essential (primary) hypertension: Secondary | ICD-10-CM

## 2023-04-09 DIAGNOSIS — Z6841 Body Mass Index (BMI) 40.0 and over, adult: Secondary | ICD-10-CM

## 2023-04-09 MED ORDER — METFORMIN HCL 500 MG PO TABS
ORAL_TABLET | ORAL | 1 refills | Status: DC
Start: 2023-04-09 — End: 2023-04-29

## 2023-04-09 MED ORDER — LOMAIRA 8 MG PO TABS: 8.0000 mg | ORAL_TABLET | Freq: Every morning | ORAL | 1 refills | Status: DC

## 2023-04-09 MED ORDER — CHLORTHALIDONE 25 MG PO TABS: 25.0000 mg | ORAL_TABLET | Freq: Every day | ORAL | 1 refills | Status: DC

## 2023-04-10 ENCOUNTER — Encounter: Payer: Self-pay | Admitting: Orthopaedic Surgery

## 2023-04-10 ENCOUNTER — Other Ambulatory Visit: Payer: Self-pay

## 2023-04-10 ENCOUNTER — Encounter (HOSPITAL_COMMUNITY)
Admission: RE | Admit: 2023-04-10 | Discharge: 2023-04-10 | Disposition: A | Discharge status: Home / Self Care | Payer: BC Managed Care – PPO | Source: Ambulatory Visit | Attending: Orthopaedic Surgery | Admitting: Orthopaedic Surgery

## 2023-04-10 ENCOUNTER — Encounter (HOSPITAL_COMMUNITY): Payer: Self-pay

## 2023-04-10 ENCOUNTER — Encounter: Payer: Self-pay | Admitting: Medical

## 2023-04-10 ENCOUNTER — Other Ambulatory Visit: Payer: Self-pay | Admitting: Physician Assistant

## 2023-04-10 VITALS — BP 112/71 | HR 72 | Temp 98.0°F | Resp 17 | Ht 68.0 in | Wt 279.3 lb

## 2023-04-10 DIAGNOSIS — R7303 Prediabetes: Secondary | ICD-10-CM | POA: Diagnosis not present

## 2023-04-10 DIAGNOSIS — I10 Essential (primary) hypertension: Secondary | ICD-10-CM | POA: Diagnosis not present

## 2023-04-10 DIAGNOSIS — Z01812 Encounter for preprocedural laboratory examination: Secondary | ICD-10-CM | POA: Diagnosis not present

## 2023-04-10 DIAGNOSIS — M1711 Unilateral primary osteoarthritis, right knee: Secondary | ICD-10-CM | POA: Insufficient documentation

## 2023-04-10 DIAGNOSIS — Z01818 Encounter for other preprocedural examination: Secondary | ICD-10-CM

## 2023-04-10 DIAGNOSIS — G4733 Obstructive sleep apnea (adult) (pediatric): Secondary | ICD-10-CM | POA: Diagnosis not present

## 2023-04-10 HISTORY — DX: Personal history of urinary calculi: Z87.442

## 2023-04-10 LAB — CBC
HCT: 42.8 % (ref 36.0–46.0)
Hemoglobin: 14.3 g/dL (ref 12.0–15.0)
MCH: 29 pg (ref 26.0–34.0)
MCHC: 33.4 g/dL (ref 30.0–36.0)
MCV: 86.8 fL (ref 80.0–100.0)
Platelets: 345 10*3/uL (ref 150–400)
RBC: 4.93 MIL/uL (ref 3.87–5.11)
RDW: 13.2 % (ref 11.5–15.5)
WBC: 9.1 10*3/uL (ref 4.0–10.5)
nRBC: 0 % (ref 0.0–0.2)

## 2023-04-10 LAB — BASIC METABOLIC PANEL
Anion gap: 10 (ref 5–15)
BUN: 21 mg/dL — ABNORMAL HIGH (ref 6–20)
CO2: 27 mmol/L (ref 22–32)
Calcium: 8.8 mg/dL — ABNORMAL LOW (ref 8.9–10.3)
Chloride: 100 mmol/L (ref 98–111)
Creatinine, Ser: 0.65 mg/dL (ref 0.44–1.00)
GFR, Estimated: >60 mL/min (ref >60)
Glucose, Bld: 109 mg/dL — ABNORMAL HIGH (ref 70–99)
Potassium: 3.1 mmol/L — ABNORMAL LOW (ref 3.5–5.1)
Sodium: 137 mmol/L (ref 135–145)

## 2023-04-10 LAB — SURGICAL PCR SCREEN
MRSA, PCR: NEGATIVE
Staphylococcus aureus: NEGATIVE

## 2023-04-10 MED ORDER — POTASSIUM CHLORIDE ER 10 MEQ PO TBCR: EXTENDED_RELEASE_TABLET | ORAL | 0 refills | Status: DC

## 2023-04-10 NOTE — Progress Notes (Signed)
PCP - Saguier, Ramon Dredge, PA-C Cardiologist - denies  PPM/ICD - denies Device Orders - n/a Rep Notified - n/a  Chest x-ray - 10/01/20 EKG - 11/13/22 Stress Test - denies ECHO - denies Cardiac Cath - denies  Sleep Study - yes, positive for OSA CPAP - patient can't tolerate  Fasting Blood Sugar - pre-diabetic  Patient is taking Phentermine - was instructed to hold it until after surgery. Patient verbalized understanding.  Blood Thinner Instructions: n/a Patient was instructed: As of today, STOP taking any Aspirin (unless otherwise instructed by your surgeon) Aleve, Naproxen, Ibuprofen, Motrin, Advil, Goody's, BC's, all herbal medications, fish oil, and all vitamins.   ERAS Protcol - yes PRE-SURGERY - G2- until 07:45 o'clock  COVID TEST- n/a   Anesthesia review: yes, abnormal EKG on 11/13/22  Patient denies shortness of breath, fever, cough and chest pain at PAT appointment   All instructions explained to the patient, with a verbal understanding of the material. Patient agrees to go over the instructions while at home for a better understanding. Patient also instructed to self quarantine after being tested for COVID-19. The opportunity to ask questions was provided.

## 2023-04-10 NOTE — Progress Notes (Signed)
Surgical Instructions    Your procedure is scheduled on June 24,2024.  Report to [facility] Main Entrance "A" at 8:15 A.M., then check in with the Admitting office.  Call this number if you have problems the morning of surgery:  [phone]   If you have any questions prior to your surgery date call [phone]: Open Monday-Friday 8am-4pm If you experience any cold or flu symptoms such as cough, fever, chills, shortness of breath, etc. between now and your scheduled surgery, please notify us at the above number     Remember:  Do not eat after midnight the night before your surgery  You may drink clear liquids until 7:45am the morning of your surgery.   Clear liquids allowed are: Water, Non-Citrus Juices (without pulp), Carbonated Beverages, Clear Tea, Black Coffee ONLY (NO MILK, CREAM OR POWDERED CREAMER of any kind), and Gatorade Patient Instructions  The night before surgery:  No food after midnight. ONLY clear liquids after midnight  The day of surgery (if you do NOT have diabetes):  Drink ONE (1) Pre-Surgery Clear Ensure by 7:45am the morning of surgery. Drink in one sitting. Do not sip.  This drink was given to you during your hospital  pre-op appointment visit.  Nothing else to drink after completing the  Pre-Surgery Clear Ensure.         If you have questions, please contact your surgeon's office.      Take these medicines the morning of surgery with A SIP OF WATER:  cetirizine (ZYRTEC)  thyroid (ARMOUR THYROID)  methocarbamol (ROBAXIN-750) if needed traMADol (ULTRAM) if needed  STOP PHENTERMINE IMMEDIATELY. DO NOT TAKE UNTIL AFTER SURGERY.   As of today, STOP taking any Aspirin (unless otherwise instructed by your surgeon) Aleve, Naproxen, Ibuprofen, Motrin, Advil, Goody's, BC's, all herbal medications, fish oil, and all vitamins.           Do not wear jewelry or makeup. Do not wear lotions, powders, perfumes/cologne or deodorant. Do not shave 48 hours prior  to surgery.  Men may shave face and neck. Do not bring valuables to the hospital. Do not wear nail polish, gel polish, artificial nails, or any other type of covering on natural nails (fingers and toes) If you have artificial nails or gel coating that need to be removed by a nail salon, please have this removed prior to surgery. Artificial nails or gel coating may interfere with anesthesia's ability to adequately monitor your vital signs.  [facility] is not responsible for any belongings or valuables.    Do NOT Smoke (Tobacco/Vaping)  24 hours prior to your procedure  If you use a CPAP at night, you may bring your mask for your overnight stay.   Contacts, glasses, hearing aids, dentures or partials may not be worn into surgery, please bring cases for these belongings   For patients admitted to the hospital, discharge time will be determined by your treatment team.   Patients discharged the day of surgery will not be allowed to drive home, and someone needs to stay with them for 24 hours.   SURGICAL WAITING ROOM VISITATION Patients having surgery or a procedure may have no more than 2 support people in the waiting area - these visitors may rotate.   Children under the age of 37 must have an adult with them who is not the patient. If the patient needs to stay at the hospital during part of their recovery, the visitor guidelines for inpatient rooms apply. Pre-op nurse will coordinate an  appropriate time for 1 support person to accompany patient in pre-op.  This support person may not rotate.   Please refer to [email or web address] for the visitor guidelines for Inpatients (after your surgery is over and you are in a regular room).    Special instructions:    Oral Hygiene is also important to reduce your risk of infection.  Remember - BRUSH YOUR TEETH THE MORNING OF SURGERY WITH YOUR REGULAR TOOTHPASTE     Pre-operative 5 CHG Bath  Instructions   You can play a key role in reducing the risk of infection after surgery. Your skin needs to be as free of germs as possible. You can reduce the number of germs on your skin by washing with CHG (chlorhexidine gluconate) soap before surgery. CHG is an antiseptic soap that kills germs and continues to kill germs even after washing.   DO NOT use if you have an allergy to chlorhexidine/CHG or antibacterial soaps. If your skin becomes reddened or irritated, stop using the CHG and notify one of our RNs at [phone].   Please shower with the CHG soap starting 4 days before surgery using the following schedule:     Please keep in mind the following:  DO NOT shave, including legs and underarms, starting the day of your first shower.   You may shave your face at any point before/day of surgery.  Place clean sheets on your bed the day you start using CHG soap. Use a clean washcloth (not used since being washed) for each shower. DO NOT sleep with pets once you start using the CHG.   CHG Shower Instructions:  If you choose to wash your hair and private area, wash first with your normal shampoo/soap.  After you use shampoo/soap, rinse your hair and body thoroughly to remove shampoo/soap residue.  Turn the water OFF and apply about 3 tablespoons (45 ml) of CHG soap to a CLEAN washcloth.  Apply CHG soap ONLY FROM YOUR NECK DOWN TO YOUR TOES (washing for 3-5 minutes)  DO NOT use CHG soap on face, private areas, open wounds, or sores.  Pay special attention to the area where your surgery is being performed.  If you are having back surgery, having someone wash your back for you may be helpful. Wait 2 minutes after CHG soap is applied, then you may rinse off the CHG soap.  Pat dry with a clean towel  Put on clean clothes/pajamas   If you choose to wear lotion, please use ONLY the CHG-compatible lotions on the back of this paper.     Additional instructions for the day of surgery: DO NOT  APPLY any lotions, deodorants, cologne, or perfumes.   Put on clean/comfortable clothes.  Brush your teeth.  Ask your nurse before applying any prescription medications to the skin.      CHG Compatible Lotions   Aveeno Moisturizing lotion  Cetaphil Moisturizing Cream  Cetaphil Moisturizing Lotion  Clairol Herbal Essence Moisturizing Lotion, Dry Skin  Clairol Herbal Essence Moisturizing Lotion, Extra Dry Skin  Clairol Herbal Essence Moisturizing Lotion, Normal Skin  Curel Age Defying Therapeutic Moisturizing Lotion with Alpha Hydroxy  Curel Extreme Care Body Lotion  Curel Soothing Hands Moisturizing Hand Lotion  Curel Therapeutic Moisturizing Cream, Fragrance-Free  Curel Therapeutic Moisturizing Lotion, Fragrance-Free  Curel Therapeutic Moisturizing Lotion, Original Formula  Eucerin Daily Replenishing Lotion  Eucerin Dry Skin Therapy Plus Alpha Hydroxy Crme  Eucerin Dry Skin Therapy Plus Alpha Hydroxy Lotion  Eucerin Original Crme  Eucerin Original Lotion  Eucerin Plus Crme Eucerin Plus Lotion  Eucerin TriLipid Replenishing Lotion  Keri Anti-Bacterial Hand Lotion  Keri Deep Conditioning Original Lotion Dry Skin Formula Softly Scented  Keri Deep Conditioning Original Lotion, Fragrance Free Sensitive Skin Formula  Keri Lotion Fast Absorbing Fragrance Free Sensitive Skin Formula  Keri Lotion Fast Absorbing Softly Scented Dry Skin Formula  Keri Original Lotion  Keri Skin Renewal Lotion Keri Silky Smooth Lotion  Keri Silky Smooth Sensitive Skin Lotion  Nivea Body Creamy Conditioning Oil  Nivea Body Extra Enriched Lotion  Nivea Body Original Lotion  Nivea Body Sheer Moisturizing Lotion Nivea Crme  Nivea Skin Firming Lotion  NutraDerm 30 Skin Lotion  NutraDerm Skin Lotion  NutraDerm Therapeutic Skin Cream  NutraDerm Therapeutic Skin Lotion  ProShield Protective Hand Cream  Provon moisturizing lotion     If you received a COVID test during your pre-op visit, it is  requested that you wear a mask when out in public, stay away from anyone that may not be feeling well, and notify your surgeon if you develop symptoms. If you have been in contact with anyone that has tested positive in the last 10 days, please notify your surgeon.    Please read over the following fact sheets that you were given.

## 2023-04-10 NOTE — Progress Notes (Signed)
Tried to call patient again. No answer. Left message on machine again and also sent a MyChart message concerning this information.

## 2023-04-10 NOTE — Progress Notes (Signed)
Can you guys call patient asap and let her know that potassium is low and I have sent in supplements that she needs to start taking ASAP

## 2023-04-10 NOTE — Progress Notes (Signed)
Tried to call patient. No answer. LMOM and will try again later.

## 2023-04-10 NOTE — Progress Notes (Signed)
Surgical Instructions    Your procedure is scheduled on June 24,2024.  Report to [facility] Main Entrance "A" at 8:15 A.M., then check in with the Admitting office.  Call this number if you have problems the morning of surgery:  [phone]   If you have any questions prior to your surgery date call [phone]: Open Monday-Friday 8am-4pm If you experience any cold or flu symptoms such as cough, fever, chills, shortness of breath, etc. between now and your scheduled surgery, please notify us at the above number     Remember:  Do not eat after midnight the night before your surgery  You may drink clear liquids until 7:45am the morning of your surgery.   Clear liquids allowed are: Water, Non-Citrus Juices (without pulp), Carbonated Beverages, Clear Tea, Black Coffee ONLY (NO MILK, CREAM OR POWDERED CREAMER of any kind), and Gatorade  Patient Instructions  The day of surgery (if you have diabetes): Drink ONE (1) 12 oz G2 given to you in your pre admission testing appointment by 07:45 the morning of surgery. Drink in one sitting. Do not sip.  This drink was given to you during your hospital  pre-op appointment visit.  Nothing else to drink after completing the  12 oz bottle of G2.         If you have questions, please contact your surgeon's office.     Take these medicines the morning of surgery with A SIP OF WATER:  cetirizine (ZYRTEC)  thyroid (ARMOUR THYROID)  methocarbamol (ROBAXIN-750) if needed traMADol (ULTRAM) if needed  STOP PHENTERMINE IMMEDIATELY. DO NOT TAKE UNTIL AFTER SURGERY.   As of today, STOP taking any Aspirin (unless otherwise instructed by your surgeon) Aleve, Naproxen, Ibuprofen, Motrin, Advil, Goody's, BC's, all herbal medications, fish oil, and all vitamins.           Do not wear jewelry or makeup. Do not wear lotions, powders, perfumes/cologne or deodorant. Do not shave 48 hours prior to surgery.  Men may shave face and neck. Do not bring valuables  to the hospital. Do not wear nail polish, gel polish, artificial nails, or any other type of covering on natural nails (fingers and toes) If you have artificial nails or gel coating that need to be removed by a nail salon, please have this removed prior to surgery. Artificial nails or gel coating may interfere with anesthesia's ability to adequately monitor your vital signs.  [facility] is not responsible for any belongings or valuables.    Do NOT Smoke (Tobacco/Vaping)  24 hours prior to your procedure  If you use a CPAP at night, you may bring your mask for your overnight stay.   Contacts, glasses, hearing aids, dentures or partials may not be worn into surgery, please bring cases for these belongings   For patients admitted to the hospital, discharge time will be determined by your treatment team.   Patients discharged the day of surgery will not be allowed to drive home, and someone needs to stay with them for 24 hours.   SURGICAL WAITING ROOM VISITATION Patients having surgery or a procedure may have no more than 2 support people in the waiting area - these visitors may rotate.   Children under the age of 27 must have an adult with them who is not the patient. If the patient needs to stay at the hospital during part of their recovery, the visitor guidelines for inpatient rooms apply. Pre-op nurse will coordinate an appropriate time for 1 support person to  accompany patient in pre-op.  This support person may not rotate.   Please refer to [email or web address] for the visitor guidelines for Inpatients (after your surgery is over and you are in a regular room).    Special instructions:    Oral Hygiene is also important to reduce your risk of infection.  Remember - BRUSH YOUR TEETH THE MORNING OF SURGERY WITH YOUR REGULAR TOOTHPASTE     Pre-operative 5 CHG Bath Instructions   You can play a key role in reducing the risk of  infection after surgery. Your skin needs to be as free of germs as possible. You can reduce the number of germs on your skin by washing with CHG (chlorhexidine gluconate) soap before surgery. CHG is an antiseptic soap that kills germs and continues to kill germs even after washing.   DO NOT use if you have an allergy to chlorhexidine/CHG or antibacterial soaps. If your skin becomes reddened or irritated, stop using the CHG and notify one of our RNs at [phone].   Please shower with the CHG soap starting 4 days before surgery using the following schedule:     Please keep in mind the following:  DO NOT shave, including legs and underarms, starting the day of your first shower.   You may shave your face at any point before/day of surgery.  Place clean sheets on your bed the day you start using CHG soap. Use a clean washcloth (not used since being washed) for each shower. DO NOT sleep with pets once you start using the CHG.   CHG Shower Instructions:  If you choose to wash your hair and private area, wash first with your normal shampoo/soap.  After you use shampoo/soap, rinse your hair and body thoroughly to remove shampoo/soap residue.  Turn the water OFF and apply about 3 tablespoons (45 ml) of CHG soap to a CLEAN washcloth.  Apply CHG soap ONLY FROM YOUR NECK DOWN TO YOUR TOES (washing for 3-5 minutes)  DO NOT use CHG soap on face, private areas, open wounds, or sores.  Pay special attention to the area where your surgery is being performed.  If you are having back surgery, having someone wash your back for you may be helpful. Wait 2 minutes after CHG soap is applied, then you may rinse off the CHG soap.  Pat dry with a clean towel  Put on clean clothes/pajamas   If you choose to wear lotion, please use ONLY the CHG-compatible lotions on the back of this paper.     Additional instructions for the day of surgery: DO NOT APPLY any lotions, deodorants, cologne, or perfumes.   Put on  clean/comfortable clothes.  Brush your teeth.  Ask your nurse before applying any prescription medications to the skin.      CHG Compatible Lotions   Aveeno Moisturizing lotion  Cetaphil Moisturizing Cream  Cetaphil Moisturizing Lotion  Clairol Herbal Essence Moisturizing Lotion, Dry Skin  Clairol Herbal Essence Moisturizing Lotion, Extra Dry Skin  Clairol Herbal Essence Moisturizing Lotion, Normal Skin  Curel Age Defying Therapeutic Moisturizing Lotion with Alpha Hydroxy  Curel Extreme Care Body Lotion  Curel Soothing Hands Moisturizing Hand Lotion  Curel Therapeutic Moisturizing Cream, Fragrance-Free  Curel Therapeutic Moisturizing Lotion, Fragrance-Free  Curel Therapeutic Moisturizing Lotion, Original Formula  Eucerin Daily Replenishing Lotion  Eucerin Dry Skin Therapy Plus Alpha Hydroxy Crme  Eucerin Dry Skin Therapy Plus Alpha Hydroxy Lotion  Eucerin Original Crme  Eucerin Original Lotion  Eucerin Plus Crme Eucerin Plus Lotion  Eucerin TriLipid Replenishing Lotion  Keri Anti-Bacterial Hand Lotion  Keri Deep Conditioning Original Lotion Dry Skin Formula Softly Scented  Keri Deep Conditioning Original Lotion, Fragrance Free Sensitive Skin Formula  Keri Lotion Fast Absorbing Fragrance Free Sensitive Skin Formula  Keri Lotion Fast Absorbing Softly Scented Dry Skin Formula  Keri Original Lotion  Keri Skin Renewal Lotion Keri Silky Smooth Lotion  Keri Silky Smooth Sensitive Skin Lotion  Nivea Body Creamy Conditioning Oil  Nivea Body Extra Enriched Lotion  Nivea Body Original Lotion  Nivea Body Sheer Moisturizing Lotion Nivea Crme  Nivea Skin Firming Lotion  NutraDerm 30 Skin Lotion  NutraDerm Skin Lotion  NutraDerm Therapeutic Skin Cream  NutraDerm Therapeutic Skin Lotion  ProShield Protective Hand Cream  Provon moisturizing lotion     If you received a COVID test during your pre-op visit, it is requested that you wear a mask when out in public, stay away from  anyone that may not be feeling well, and notify your surgeon if you develop symptoms. If you have been in contact with anyone that has tested positive in the last 10 days, please notify your surgeon.    Please read over the following fact sheets that you were given.

## 2023-04-11 ENCOUNTER — Encounter (HOSPITAL_COMMUNITY): Payer: Self-pay | Admitting: Vascular Surgery

## 2023-04-11 NOTE — Progress Notes (Signed)
Anesthesia Chart Review:  Case: [id number] Date/Time: 04/21/23 1030   Procedure: RIGHT TOTAL KNEE ARTHROPLASTY (Right: Knee)   Anesthesia type: Spinal   Pre-op diagnosis: right knee osteoarthritis   [facility]: [facility]OM 06 / [facility]   Surgeons: Tarry Kos, MD       DISCUSSION: Patient is a 57 year old female scheduled for the above procedure.  History includes never smoker, HTN, OSA (intolerant to CPAP), pre-diabetes, osteoarthritis (left TKA 11/22/22).   04/10/23 K 3.1, ortho is calling in a KCl supplement.   Will hold phentermine until after surgery.   She tolerated similar procedure on the left within the past six months. She had recent routine PCP follow-up in April.  She has been losing weight intentionally and under medical supervision. She denied SOB and chest pain. Anesthesia team to evaluate on the day of surgery.    VS: BP 112/71   Pulse 72   Temp 36.7 C   Resp 17   Ht 5\' 8"  (1.727 m)   Wt 126.7 kg   SpO2 95%   BMI 42.47 kg/m   PROVIDERS: Saguier, Kateri Mc is PCP  Quillian Quince, MD is provider at [facility] Myna Hidalgo, DO is GYN   LABS: Labs reviewed: Acceptable for surgery. (all labs ordered are listed, but only abnormal results are displayed)  Labs Reviewed  BASIC METABOLIC PANEL - Abnormal; Notable for the following components:      Result Value   Potassium 3.1 (*)    Glucose, Bld 109 (*)    BUN 21 (*)    Calcium 8.8 (*)    All other components within normal limits  SURGICAL PCR SCREEN  CBC     IMAGES: Xray right knee 02/14/23: "X-rays demonstrate severe osteoarthritis.  Bone-on-bone joint space  narrowing."   EKG: 11/13/22: Normal sinus rhythm Possible Anterior infarct , age undetermined Abnormal ECG No previous ECGs available Confirmed by Debbe Odea ([id number]) on 11/15/2022 1:47:07 PM   CV: N/A  Past Medical History:  Diagnosis Date   B12 deficiency    Bilateral swelling of feet and ankles    History of  kidney stones    Hypertension    Joint pain    Kidney stones    Knee pain    Osteoarthritis    Other fatigue    Pre-diabetes    Prediabetes    Sleep apnea    does not use CPAP   Thyroid disease    Vitamin D deficiency     Past Surgical History:  Procedure Laterality Date   COLONOSCOPY WITH PROPOFOL N/A 04/10/2021   Procedure: COLONOSCOPY WITH PROPOFOL;  Surgeon: Lynann Bologna, MD;  Location: [facility];  Service: Endoscopy;  Laterality: N/A;   JOINT REPLACEMENT     KIDNEY STONE SURGERY     miniscus repair Left    POLYPECTOMY  04/10/2021   Procedure: POLYPECTOMY;  Surgeon: Lynann Bologna, MD;  Location: [facility];  Service: Endoscopy;;   SEPTOPLASTY     TOTAL KNEE ARTHROPLASTY Left 11/22/2022   Procedure: LEFT TOTAL KNEE REPLACEMENT;  Surgeon: Tarry Kos, MD;  Location: [facility];  Service: Orthopedics;  Laterality: Left;    MEDICATIONS:  potassium chloride (KLOR-CON) 10 MEQ tablet   Ascorbic Acid (VITAMIN C PO)   calcium carbonate (TUMS - DOSED IN MG ELEMENTAL CALCIUM) 500 MG chewable tablet   cetirizine (ZYRTEC) 10 MG tablet   chlorthalidone (HYGROTON) 25 MG tablet   estradiol (VIVELLE-DOT) 0.1 MG/24HR patch   losartan (  COZAAR) 100 MG tablet   metFORMIN (GLUCOPHAGE) 500 MG tablet   methocarbamol (ROBAXIN-750) 750 MG tablet   Multiple Vitamin (MULTIVITAMIN WITH MINERALS) TABS tablet   Omega-3 Fatty Acids (FISH OIL) 1000 MG CAPS   Phentermine HCl (LOMAIRA) 8 MG TABS   progesterone (PROMETRIUM) 100 MG capsule   thyroid (ARMOUR THYROID) 120 MG tablet   traMADol (ULTRAM) 50 MG tablet   No current facility-administered medications for this encounter.    Shonna Chock, PA-C Surgical Short Stay/Anesthesiology [facility] Phone [phone] [facility] Phone [phone] 04/11/2023 4:13 PM

## 2023-04-11 NOTE — Anesthesia Preprocedure Evaluation (Signed)
Anesthesia Evaluation    Airway        Dental   Pulmonary           Cardiovascular hypertension,      Neuro/Psych    GI/Hepatic   Endo/Other    Renal/GU      Musculoskeletal   Abdominal   Peds  Hematology   Anesthesia Other Findings   Reproductive/Obstetrics                             Anesthesia Physical Anesthesia Plan  ASA:   Anesthesia Plan:    Post-op Pain Management:    Induction:   PONV Risk Score and Plan:   Airway Management Planned:   Additional Equipment:   Intra-op Plan:   Post-operative Plan:   Informed Consent:   Plan Discussed with:   Anesthesia Plan Comments: (PAT note written 04/11/2023 by Shonna Chock, PA-C.  )       Anesthesia Quick Evaluation

## 2023-04-12 ENCOUNTER — Encounter: Payer: Self-pay | Admitting: Family Medicine

## 2023-04-12 ENCOUNTER — Other Ambulatory Visit: Payer: Self-pay | Admitting: Medical

## 2023-04-12 ENCOUNTER — Encounter: Payer: Self-pay | Admitting: Obstetrics and Gynecology

## 2023-04-13 ENCOUNTER — Encounter: Payer: Self-pay | Admitting: Orthopaedic Surgery

## 2023-04-14 ENCOUNTER — Encounter: Payer: Self-pay | Admitting: Medical

## 2023-04-14 NOTE — Progress Notes (Unsigned)
[name], PhD, LAT, ATC acting as a scribe for [name], MD.  Laura Knox is a 57 y.o. female who presents to [facility] at [facility] today for L foot pain. Her L knee was replaced on 11/22/22 by Dr. [name]. Pt was previously seen by Dr. [name] 08/14/22 for bilat knee pain and was given bilat knee steroid injections. Of note, she is scheduled to undergo a R TKR on June 24th.   Today, pt c/o L foot pain ongoing since June 14th w/ no MOI. Pt locates pain to right great toe and base of the great toe.  Patient woke up Friday with left foot swollen and sore and was unable to bear any weight. Patient is having to walk really weirdly because of the pain that now her knees are bothering her pretty bad. Patient is worried that her knee surgery Monday may be too soon.  Patient states that her foot pain came on so fast then it went away fast. Patient states that her blood work came back and her potassium was super low and her surgeon wanted her to take a lot of potasium and after she took all the potassium the pain went away. Patient is wondering if maybe she had gout.   L foot swelling: yes not anymore  Aggravates; everything, but better now Treatments tried: some rest, potassium   Pertinent review of systems: No fevers or chills  Relevant historical information: History left knee replacement about 6 months ago scheduled for right knee replacement next week.  No known history of gout.   Exam:  BP 110/88   Pulse 73   Ht 5\' 8"  (1.727 m)   SpO2 98%   BMI 42.47 kg/m  General: Well Developed, well nourished, and in no acute distress.   MSK: Left great toe mildly swollen.  Mildly tender palpation.  Decreased range of motion dorsal and plantarflexion.    Lab and Radiology Results  X-ray images left great toe obtained today personally and independently interpreted No severe DJD at first MTP.  Mild degenerative changes are present.  No acute fractures are visible. Await  formal radiology review  Assessment and Plan: 57 y.o. female with left great toe pain resolving or improving spontaneously.  Pain is very concerning for gout.  Will check uric acid and start colchicine empirically.  Will use turf toe insole or postop shoe if needed.  She has some concerns about her upcoming knee replacement surgery next week.  I advised her to contact her orthopedic surgeons office about her concerns.  I am a little worried about her going into surgery if her left toe is painful.   PDMP not reviewed this encounter. Orders Placed This Encounter  Procedures   DG Toe Great Left    Standing Status:   Future    Number of Occurrences:   1    Standing Expiration Date:   04/14/2024    Order Specific Question:   Reason for Exam (SYMPTOM  OR DIAGNOSIS REQUIRED)    Answer:   eval toe pain    Order Specific Question:   Is patient pregnant?    Answer:   No    Order Specific Question:   Preferred imaging location?    Answer:   [name]   Basic Metabolic Panel (BMET)    Standing Status:   Future    Number of Occurrences:   1    Standing Expiration Date:   04/14/2024   Uric acid  Standing Status:   Future    Number of Occurrences:   1    Standing Expiration Date:   04/14/2024   Meds ordered this encounter  Medications   colchicine 0.6 MG tablet    Sig: Take 1 tablet (0.6 mg total) by mouth daily as needed (gout or psuedogout pain).    Dispense:  30 tablet    Refill:  2     Discussed warning signs or symptoms. Please see discharge instructions. Patient expresses understanding.   The above documentation has been reviewed and is accurate and complete [name], M.D.

## 2023-04-14 NOTE — Progress Notes (Signed)
Chief Complaint:   OBESITY Laura Knox is here to discuss her progress with her obesity treatment plan along with follow-up of her obesity related diagnoses. Laura Knox is on following a lower carbohydrate, vegetable and lean protein rich diet plan and states she is following her eating plan approximately 50% of the time. Laura Knox states she is lifting weights, water exercises, and machine for 30-120 minutes 6 times per week.  Today's visit was #: 38 Starting weight: 378 lbs Starting date: 12/27/2020 Today's weight: 273 lbs Today's date: 04/09/2023 Total lbs lost to date: 105 Total lbs lost since last in-office visit: 0  Interim History: Patient is retaining fluid today.  She has been struggling to be strict with her low carbohydrate plan.  She has gained some weight but she is working on getting back on track.  Subjective:   1. Essential hypertension Patient's blood pressure is controlled, and she denies chest pain, headache, or dizziness.  She is working on her weight loss.  2. Pre-diabetes Patient is working on decreasing simple carbohydrates, and no side effects were noted.  3. Polyphagia Patient feels her Laura Knox has helped decrease polyphagia.  Assessment/Plan:   1. Essential hypertension Patient will continue chlorthalidone, and we will refill for 2 months.  - chlorthalidone (HYGROTON) 25 MG tablet; Take 1 tablet (25 mg total) by mouth daily.  Dispense: 30 tablet; Refill: 1  2. Pre-diabetes Patient will continue metformin, and we will refill for 2 months.  - metFORMIN (GLUCOPHAGE) 500 MG tablet; 2 po with lunch qd  Dispense: 60 tablet; Refill: 1  3. Polyphagia We will refill Lomaira for 2 months.  Patient needs to lose weight in order to continue this medication.  We will recheck labs in 1 month.  - Phentermine HCl (LOMAIRA) 8 MG TABS; Take 1 tablet (8 mg total) by mouth every morning. Before 1st meal of the day.  Dispense: 30 tablet; Refill: 1  4. BMI 40.0-44.9, adult  (HCC) - Phentermine HCl (LOMAIRA) 8 MG TABS; Take 1 tablet (8 mg total) by mouth every morning. Before 1st meal of the day.  Dispense: 30 tablet; Refill: 1  5. Obesity, Beginning BMI 61.01 - Phentermine HCl (LOMAIRA) 8 MG TABS; Take 1 tablet (8 mg total) by mouth every morning. Before 1st meal of the day.  Dispense: 30 tablet; Refill: 1  Laura Knox is currently in the action stage of change. As such, her goal is to continue with weight loss efforts. She has agreed to following a lower carbohydrate, vegetable and lean protein rich diet plan.   Exercise goals: As is.  Behavioral modification strategies: increasing lean protein intake.  Laura Knox has agreed to follow-up with our clinic in 4 weeks. She was informed of the importance of frequent follow-up visits to maximize her success with intensive lifestyle modifications for her multiple health conditions.   Objective:   Blood pressure 134/81, pulse 61, height 5\' 7"  (1.702 m), weight 273 lb (123.8 kg), SpO2 99 %. Body mass index is 42.76 kg/m.  Lab Results  Component Value Date   CREATININE 0.65 04/10/2023   BUN 21 (H) 04/10/2023   NA 137 04/10/2023   K 3.1 (L) 04/10/2023   CL 100 04/10/2023   CO2 27 04/10/2023   Lab Results  Component Value Date   ALT 15 02/04/2023   AST 13 02/04/2023   ALKPHOS 93 02/04/2023   BILITOT 0.4 02/04/2023   Lab Results  Component Value Date   HGBA1C 5.7 (H) 04/02/2023   HGBA1C 5.5 02/04/2023  HGBA1C 5.8 (H) 08/12/2022   HGBA1C 5.6 02/06/2022   HGBA1C 5.9 (H) 06/18/2021   Lab Results  Component Value Date   INSULIN 7.4 08/12/2022   INSULIN 16.7 06/18/2021   INSULIN 20.4 12/27/2020   Lab Results  Component Value Date   TSH 0.22 (L) 02/04/2023   Lab Results  Component Value Date   CHOL 120 02/04/2023   HDL 44.60 02/04/2023   LDLCALC 65 02/04/2023   TRIG 53.0 02/04/2023   CHOLHDL 3 02/04/2023   Lab Results  Component Value Date   VD25OH 41.3 08/12/2022   VD25OH 46.7 02/06/2022   VD25OH  76.3 06/18/2021   Lab Results  Component Value Date   WBC 9.1 04/10/2023   HGB 14.3 04/10/2023   HCT 42.8 04/10/2023   MCV 86.8 04/10/2023   PLT 345 04/10/2023   Lab Results  Component Value Date   FERRITIN 88 02/06/2021   Attestation Statements:   Reviewed by clinician on day of visit: allergies, medications, problem list, medical history, surgical history, family history, social history, and previous encounter notes.   I, Burt Knack, am acting as transcriptionist for Quillian Quince, MD.  I have reviewed the above documentation for accuracy and completeness, and I agree with the above. -  Quillian Quince, MD

## 2023-04-15 ENCOUNTER — Ambulatory Visit (INDEPENDENT_AMBULATORY_CARE_PROVIDER_SITE_OTHER): Payer: BC Managed Care – PPO

## 2023-04-15 ENCOUNTER — Ambulatory Visit: Payer: BC Managed Care – PPO | Admitting: Family Medicine

## 2023-04-15 ENCOUNTER — Telehealth: Payer: Self-pay | Admitting: Orthopaedic Surgery

## 2023-04-15 VITALS — BP 110/88 | HR 73 | Ht 68.0 in

## 2023-04-15 DIAGNOSIS — M79675 Pain in left toe(s): Secondary | ICD-10-CM

## 2023-04-15 DIAGNOSIS — M7989 Other specified soft tissue disorders: Secondary | ICD-10-CM | POA: Diagnosis not present

## 2023-04-15 LAB — BASIC METABOLIC PANEL
BUN: 18 mg/dL (ref 6–23)
CO2: 28 mEq/L (ref 19–32)
Calcium: 9.2 mg/dL (ref 8.4–10.5)
Chloride: 101 mEq/L (ref 96–112)
Creatinine, Ser: 0.71 mg/dL (ref 0.40–1.20)
GFR: 93.97 mL/min (ref >60.00)
Glucose, Bld: 106 mg/dL — ABNORMAL HIGH (ref 70–99)
Potassium: 3.9 mEq/L (ref 3.5–5.1)
Sodium: 138 mEq/L (ref 135–145)

## 2023-04-15 LAB — URIC ACID: Uric Acid, Serum: 5.9 mg/dL (ref 2.4–7.0)

## 2023-04-15 MED ORDER — COLCHICINE 0.6 MG PO TABS
0.6000 mg | ORAL_TABLET | Freq: Every day | ORAL | 2 refills | Status: DC | PRN
Start: 2023-04-15 — End: 2023-07-22

## 2023-04-15 NOTE — Telephone Encounter (Signed)
Patient is scheduled for right total knee. Seen by PCP [name] this morning for left foot pain.  New prescription (Colchicine) written for possible gout.  Patient would like to wait until the x-ray results and lab work come back before cancelling the knee surgery.

## 2023-04-15 NOTE — Patient Instructions (Signed)
Thank you for coming in today.   Please get an Xray today before you leave   Please get labs today before you leave   Try colchicine daily as needed for got flair pain.   Ok to use turf toe insole or post op shoe.   Communicate with Dr [name] and Corrie Dandy about possibly delaying surgery.   Get a Steel Turf Toe insole.  Do a Microbiologist for [employer]

## 2023-04-16 ENCOUNTER — Other Ambulatory Visit: Payer: Self-pay | Admitting: Medical

## 2023-04-16 ENCOUNTER — Other Ambulatory Visit: Payer: Self-pay | Admitting: Physician Assistant

## 2023-04-16 MED ORDER — HYDROCODONE-ACETAMINOPHEN 7.5-325 MG PO TABS: 1.0000 | ORAL_TABLET | Freq: Four times a day (QID) | ORAL | 0 refills | Status: DC | PRN

## 2023-04-16 MED ORDER — ASPIRIN 81 MG PO TBEC: 81.0000 mg | DELAYED_RELEASE_TABLET | Freq: Two times a day (BID) | ORAL | 0 refills | Status: DC

## 2023-04-16 MED ORDER — METHOCARBAMOL 750 MG PO TABS: 750.0000 mg | ORAL_TABLET | Freq: Two times a day (BID) | ORAL | 2 refills | Status: DC | PRN

## 2023-04-16 MED ORDER — DOCUSATE SODIUM 100 MG PO CAPS: 100.0000 mg | ORAL_CAPSULE | Freq: Every day | ORAL | 2 refills | Status: DC | PRN

## 2023-04-16 MED ORDER — ONDANSETRON HCL 4 MG PO TABS: 4.0000 mg | ORAL_TABLET | Freq: Three times a day (TID) | ORAL | 0 refills | Status: DC | PRN

## 2023-04-16 NOTE — Progress Notes (Signed)
Uric acid is a little elevated but not quite high enough to treat.  This could have been gout.

## 2023-04-17 ENCOUNTER — Telehealth: Payer: Self-pay

## 2023-04-17 ENCOUNTER — Telehealth: Payer: Self-pay | Admitting: Orthopaedic Surgery

## 2023-04-17 DIAGNOSIS — Z79890 Hormone replacement therapy (postmenopausal): Secondary | ICD-10-CM

## 2023-04-17 NOTE — Telephone Encounter (Signed)
RIGHT TKA with Dr. [name] is cancelled for 04-21-23.  Patient would like to put off for now and reschedule in the month of July while giving her left toe time to heal.  Patient is currently being treated for gout  by Dr. [name].  Patient is aware Dr. Warren Danes surgery schedule is booked through September.  Patient would like to be considered for a surgery date in July should any patients cancel or  any openings come available.

## 2023-04-17 NOTE — Telephone Encounter (Signed)
Pt is requesting a refill on her progesterone from Dr. Para March.  [facility] pt.  Message routed to [facility] pool and Dr. Para March.   Leonette Nutting  04/17/23

## 2023-04-18 ENCOUNTER — Other Ambulatory Visit: Payer: Self-pay | Admitting: *Deleted

## 2023-04-18 DIAGNOSIS — Z79890 Hormone replacement therapy (postmenopausal): Secondary | ICD-10-CM

## 2023-04-18 MED ORDER — PROGESTERONE MICRONIZED 100 MG PO CAPS
100.0000 mg | ORAL_CAPSULE | Freq: Every day | ORAL | 3 refills | Status: DC
Start: 2023-04-18 — End: 2024-05-19

## 2023-04-18 NOTE — Telephone Encounter (Signed)
thx

## 2023-04-19 ENCOUNTER — Encounter (INDEPENDENT_AMBULATORY_CARE_PROVIDER_SITE_OTHER): Payer: Self-pay | Admitting: Family Medicine

## 2023-04-21 ENCOUNTER — Ambulatory Visit (HOSPITAL_COMMUNITY)
Admission: RE | Admit: 2023-04-21 | Payer: BC Managed Care – PPO | Source: Home / Self Care | Admitting: Orthopaedic Surgery

## 2023-04-21 ENCOUNTER — Encounter (HOSPITAL_COMMUNITY): Admission: RE | Payer: Self-pay | Source: Home / Self Care

## 2023-04-21 DIAGNOSIS — M1711 Unilateral primary osteoarthritis, right knee: Secondary | ICD-10-CM

## 2023-04-21 SURGERY — ARTHROPLASTY, KNEE, TOTAL
Anesthesia: Spinal | Site: Knee | Laterality: Right

## 2023-04-21 NOTE — Progress Notes (Signed)
Left big toe shows some swelling but no fractures.  No arthritis is visible.

## 2023-04-29 ENCOUNTER — Ambulatory Visit (INDEPENDENT_AMBULATORY_CARE_PROVIDER_SITE_OTHER): Payer: BC Managed Care – PPO | Admitting: Family Medicine

## 2023-04-29 ENCOUNTER — Encounter (INDEPENDENT_AMBULATORY_CARE_PROVIDER_SITE_OTHER): Payer: Self-pay | Admitting: Family Medicine

## 2023-04-29 DIAGNOSIS — I10 Essential (primary) hypertension: Secondary | ICD-10-CM | POA: Diagnosis not present

## 2023-04-29 DIAGNOSIS — E669 Obesity, unspecified: Secondary | ICD-10-CM | POA: Diagnosis not present

## 2023-04-29 DIAGNOSIS — Z6841 Body Mass Index (BMI) 40.0 and over, adult: Secondary | ICD-10-CM | POA: Diagnosis not present

## 2023-04-29 DIAGNOSIS — R7303 Prediabetes: Secondary | ICD-10-CM

## 2023-04-29 MED ORDER — PHENTERMINE HCL 15 MG PO CAPS
15.0000 mg | ORAL_CAPSULE | ORAL | 0 refills | Status: DC
Start: 2023-04-29 — End: 2023-06-11

## 2023-04-29 MED ORDER — CHLORTHALIDONE 25 MG PO TABS: 25.0000 mg | ORAL_TABLET | Freq: Every day | ORAL | 1 refills | Status: DC

## 2023-04-29 MED ORDER — METFORMIN HCL 500 MG PO TABS: ORAL_TABLET | ORAL | 1 refills | Status: DC

## 2023-04-29 NOTE — Progress Notes (Unsigned)
Chief Complaint:   OBESITY Laura Knox is here to discuss her progress with her obesity treatment plan along with follow-up of her obesity related diagnoses. Laura Knox is on following a lower carbohydrate, vegetable and lean protein rich diet plan and states she is following her eating plan approximately 65% of the time. Laura Knox states she is doing water exercise, cardio, and weights for 90 minutes 7 times per week.  Today's visit was #: 39 Starting weight: 378 lbs Starting date: 12/27/2020 Today's weight: 277 lbs Today's date: 04/29/2023 Total lbs lost to date: 101 Total lbs lost since last in-office visit: 0  Interim History: Patient has gained some weight, and some of it may be due to increased water with stopping her chlorthalidone recently.  She is struggling to eat all of her protein which may be decreasing her RMR.  She is exercising regularly and trying to strengthen her knees for improved mobility and healing for knee replacement surgery.   Subjective:   1. Pre-diabetes Patient's recent A1c was 5.7, and she is working on her diet and weight loss.  2. Essential hypertension Patient had a recent episode of possible gout and low K+.  She held her chlorthalidone.  Assessment/Plan:   1. Pre-diabetes Patient will continue metformin twice daily, and we will refill for 2 months.  - metFORMIN (GLUCOPHAGE) 500 MG tablet; 2 po with lunch qd  Dispense: 60 tablet; Refill: 1  2. Essential hypertension Patient will continue to hold chlorthalidone.  Her blood pressure is stable, and we will recheck her blood pressure in 3 to 4 weeks.  3. BMI 40.0-44.9, adult (HCC) - phentermine 15 MG capsule; Take 1 capsule (15 mg total) by mouth every morning.  Dispense: 30 capsule; Refill: 0  4. Obesity, Beginning BMI 61.01 Patient agreed to increase phentermine to 15 mg once daily, with no refills.   - phentermine 15 MG capsule; Take 1 capsule (15 mg total) by mouth every morning.  Dispense: 30 capsule;  Refill: 0  Laura Knox is currently in the action stage of change. As such, her goal is to continue with weight loss efforts. She has agreed to change to the [employer].   Exercise goals: As is.   Behavioral modification strategies: increasing lean protein intake and meal planning and cooking strategies.  Laura Knox has agreed to follow-up with our clinic in 4 weeks. She was informed of the importance of frequent follow-up visits to maximize her success with intensive lifestyle modifications for her multiple health conditions.   Objective:   Blood pressure 107/66, pulse 90, temperature 97.7 F (36.5 C), height 5\' 8"  (1.727 m), weight 277 lb (125.6 kg), SpO2 98 %. Body mass index is 42.12 kg/m.  Lab Results  Component Value Date   CREATININE 0.71 04/15/2023   BUN 18 04/15/2023   NA 138 04/15/2023   K 3.9 04/15/2023   CL 101 04/15/2023   CO2 28 04/15/2023   Lab Results  Component Value Date   ALT 15 02/04/2023   AST 13 02/04/2023   ALKPHOS 93 02/04/2023   BILITOT 0.4 02/04/2023   Lab Results  Component Value Date   HGBA1C 5.7 (H) 04/02/2023   HGBA1C 5.5 02/04/2023   HGBA1C 5.8 (H) 08/12/2022   HGBA1C 5.6 02/06/2022   HGBA1C 5.9 (H) 06/18/2021   Lab Results  Component Value Date   INSULIN 7.4 08/12/2022   INSULIN 16.7 06/18/2021   INSULIN 20.4 12/27/2020   Lab Results  Component Value Date   TSH 0.22 (L) 02/04/2023  Lab Results  Component Value Date   CHOL 120 02/04/2023   HDL 44.60 02/04/2023   LDLCALC 65 02/04/2023   TRIG 53.0 02/04/2023   CHOLHDL 3 02/04/2023   Lab Results  Component Value Date   VD25OH 41.3 08/12/2022   VD25OH 46.7 02/06/2022   VD25OH 76.3 06/18/2021   Lab Results  Component Value Date   WBC 9.1 04/10/2023   HGB 14.3 04/10/2023   HCT 42.8 04/10/2023   MCV 86.8 04/10/2023   PLT 345 04/10/2023   Lab Results  Component Value Date   FERRITIN 88 02/06/2021   Attestation Statements:   Reviewed by clinician on day of visit:  allergies, medications, problem list, medical history, surgical history, family history, social history, and previous encounter notes.   I, Burt Knack, am acting as transcriptionist for Quillian Quince, MD.  I have reviewed the above documentation for accuracy and completeness, and I agree with the above. -  Quillian Quince, MD

## 2023-05-02 ENCOUNTER — Other Ambulatory Visit: Payer: Self-pay | Admitting: Medical

## 2023-05-06 ENCOUNTER — Encounter: Payer: BC Managed Care – PPO | Admitting: Physician Assistant

## 2023-05-15 ENCOUNTER — Encounter: Payer: Self-pay | Admitting: Orthopaedic Surgery

## 2023-05-15 ENCOUNTER — Other Ambulatory Visit: Payer: Self-pay | Admitting: Orthopaedic Surgery

## 2023-05-15 MED ORDER — TRAMADOL HCL 50 MG PO TABS: 50.0000 mg | ORAL_TABLET | Freq: Every day | ORAL | 0 refills | Status: DC | PRN

## 2023-05-28 ENCOUNTER — Ambulatory Visit (INDEPENDENT_AMBULATORY_CARE_PROVIDER_SITE_OTHER): Payer: BC Managed Care – PPO | Admitting: Family Medicine

## 2023-05-29 ENCOUNTER — Ambulatory Visit (INDEPENDENT_AMBULATORY_CARE_PROVIDER_SITE_OTHER): Payer: BC Managed Care – PPO | Admitting: Family Medicine

## 2023-06-03 ENCOUNTER — Encounter: Payer: Self-pay | Admitting: Medical

## 2023-06-04 ENCOUNTER — Ambulatory Visit (INDEPENDENT_AMBULATORY_CARE_PROVIDER_SITE_OTHER): Payer: BC Managed Care – PPO | Admitting: Family Medicine

## 2023-06-08 ENCOUNTER — Other Ambulatory Visit: Payer: Self-pay | Admitting: Medical

## 2023-06-08 ENCOUNTER — Encounter: Payer: Self-pay | Admitting: Orthopaedic Surgery

## 2023-06-09 ENCOUNTER — Other Ambulatory Visit: Payer: Self-pay | Admitting: Physician Assistant

## 2023-06-09 MED ORDER — METHOCARBAMOL 750 MG PO TABS: 750.0000 mg | ORAL_TABLET | Freq: Two times a day (BID) | ORAL | 2 refills | Status: DC | PRN

## 2023-06-09 NOTE — Telephone Encounter (Signed)
Sent to pharmacy on file 

## 2023-06-11 ENCOUNTER — Ambulatory Visit (INDEPENDENT_AMBULATORY_CARE_PROVIDER_SITE_OTHER): Payer: BC Managed Care – PPO | Admitting: Family Medicine

## 2023-06-11 ENCOUNTER — Encounter (INDEPENDENT_AMBULATORY_CARE_PROVIDER_SITE_OTHER): Payer: Self-pay | Admitting: Family Medicine

## 2023-06-11 VITALS — BP 152/88 | HR 64 | Temp 98.1°F | Ht 68.0 in | Wt 272.0 lb

## 2023-06-11 DIAGNOSIS — E669 Obesity, unspecified: Secondary | ICD-10-CM

## 2023-06-11 DIAGNOSIS — Z6841 Body Mass Index (BMI) 40.0 and over, adult: Secondary | ICD-10-CM | POA: Diagnosis not present

## 2023-06-11 DIAGNOSIS — I10 Essential (primary) hypertension: Secondary | ICD-10-CM

## 2023-06-11 MED ORDER — AMLODIPINE BESYLATE 5 MG PO TABS
5.0000 mg | ORAL_TABLET | Freq: Every day | ORAL | 0 refills | Status: DC
Start: 2023-06-11 — End: 2023-07-02

## 2023-06-11 MED ORDER — PHENTERMINE HCL 15 MG PO CAPS: 15.0000 mg | ORAL_CAPSULE | ORAL | 0 refills | Status: DC

## 2023-06-16 NOTE — Progress Notes (Signed)
Chief Complaint:   OBESITY Laura Knox is here to discuss her progress with her obesity treatment plan along with follow-up of her obesity related diagnoses. Laura Knox is on the [employer] and states she is following her eating plan approximately 35% of the time. Laura Knox states she is doing water exercise and using exercise machines for 60-120 minutes 6 times per week.  Today's visit was #: 40 Starting weight: 378 lbs Starting date: 12/27/2020 Today's weight: 272 lbs Today's date: 06/11/2023 Total lbs lost to date: 106 Total lbs lost since last in-office visit: 5  Interim History: Patient continues to do well with her diet.  Her hunger is mostly controlled.  She is working on exercising daily.  Subjective:   1. Essential hypertension Patient's blood pressure is elevated.  She has been off hydrochlorothiazide and chlorthalidone due to low K+.  She is doing well on phentermine and she would like to stay on it to keep working on weight loss.  Assessment/Plan:   1. Essential hypertension Patient will continue losartan, and she agreed to start Norvasc 5 mg once daily with no refills.  We will recheck her blood pressure at her next visit in 3 weeks.  - amLODipine (NORVASC) 5 MG tablet; Take 1 tablet (5 mg total) by mouth daily.  Dispense: 30 tablet; Refill: 0  2. Obesity, Beginning BMI 61.01  - phentermine 15 MG capsule; Take 1 capsule (15 mg total) by mouth every morning.  Dispense: 30 capsule; Refill: 0  3. BMI 40.0-44.9, adult [facility]) Patient will continue phentermine 15 mg once daily, and we will refill for 1 month.  - phentermine 15 MG capsule; Take 1 capsule (15 mg total) by mouth every morning.  Dispense: 30 capsule; Refill: 0  Laura Knox is currently in the action stage of change. As such, her goal is to continue with weight loss efforts. She has agreed to the [employer].   Exercise goals: As is.   Behavioral modification strategies: increasing lean protein intake.  Laura Knox  has agreed to follow-up with our clinic in 3 weeks. She was informed of the importance of frequent follow-up visits to maximize her success with intensive lifestyle modifications for her multiple health conditions.   Objective:   Blood pressure (!) 152/88, pulse 64, temperature 98.1 F (36.7 C), height 5\' 8"  (1.727 m), weight 272 lb (123.4 kg), SpO2 99%. Body mass index is 41.36 kg/m.  Lab Results  Component Value Date   CREATININE 0.71 04/15/2023   BUN 18 04/15/2023   NA 138 04/15/2023   K 3.9 04/15/2023   CL 101 04/15/2023   CO2 28 04/15/2023   Lab Results  Component Value Date   ALT 15 02/04/2023   AST 13 02/04/2023   ALKPHOS 93 02/04/2023   BILITOT 0.4 02/04/2023   Lab Results  Component Value Date   HGBA1C 5.7 (H) 04/02/2023   HGBA1C 5.5 02/04/2023   HGBA1C 5.8 (H) 08/12/2022   HGBA1C 5.6 02/06/2022   HGBA1C 5.9 (H) 06/18/2021   Lab Results  Component Value Date   INSULIN 7.4 08/12/2022   INSULIN 16.7 06/18/2021   INSULIN 20.4 12/27/2020   Lab Results  Component Value Date   TSH 0.22 (L) 02/04/2023   Lab Results  Component Value Date   CHOL 120 02/04/2023   HDL 44.60 02/04/2023   LDLCALC 65 02/04/2023   TRIG 53.0 02/04/2023   CHOLHDL 3 02/04/2023   Lab Results  Component Value Date   VD25OH 41.3 08/12/2022   VD25OH 46.7  02/06/2022   VD25OH 76.3 06/18/2021   Lab Results  Component Value Date   WBC 9.1 04/10/2023   HGB 14.3 04/10/2023   HCT 42.8 04/10/2023   MCV 86.8 04/10/2023   PLT 345 04/10/2023   Lab Results  Component Value Date   FERRITIN 88 02/06/2021   Attestation Statements:   Reviewed by clinician on day of visit: allergies, medications, problem list, medical history, surgical history, family history, social history, and previous encounter notes.   I, Burt Knack, am acting as transcriptionist for Quillian Quince, MD.  I have reviewed the above documentation for accuracy and completeness, and I agree with the above. -  Quillian Quince, MD

## 2023-07-02 ENCOUNTER — Encounter (INDEPENDENT_AMBULATORY_CARE_PROVIDER_SITE_OTHER): Payer: Self-pay | Admitting: Family Medicine

## 2023-07-02 ENCOUNTER — Ambulatory Visit (INDEPENDENT_AMBULATORY_CARE_PROVIDER_SITE_OTHER): Payer: Self-pay | Admitting: Family Medicine

## 2023-07-02 DIAGNOSIS — E669 Obesity, unspecified: Secondary | ICD-10-CM

## 2023-07-02 DIAGNOSIS — I10 Essential (primary) hypertension: Secondary | ICD-10-CM

## 2023-07-02 DIAGNOSIS — Z6841 Body Mass Index (BMI) 40.0 and over, adult: Secondary | ICD-10-CM

## 2023-07-02 MED ORDER — AMLODIPINE BESYLATE 5 MG PO TABS: 5.0000 mg | ORAL_TABLET | Freq: Every day | ORAL | 0 refills | Status: DC

## 2023-07-02 MED ORDER — PHENTERMINE HCL 15 MG PO CAPS: 15.0000 mg | ORAL_CAPSULE | ORAL | 0 refills | Status: DC

## 2023-07-02 NOTE — Progress Notes (Unsigned)
Chief Complaint:   OBESITY Laura Knox is here to discuss her progress with her obesity treatment plan along with follow-up of her obesity related diagnoses. Laura Knox is on the [employer] and states she is following her eating plan approximately 70-75% of the time. Laura Knox states she is using the exercise machine and doing water aerobics for 90 minutes 6 times per week.  Today's visit was #: 41 Starting weight: 378 lbs Starting date: 12/27/2020 Today's weight: 273 lbs Today's date: 07/02/2023 Total lbs lost to date: 105 Total lbs lost since last in-office visit: 0  Interim History: Patient has done well with her exercise.  She has gained 1 pound but this appears to be due to muscle.  She is working on meeting her protein goals.  Subjective:   1. Essential hypertension Patient's blood pressure is controlled on her medications, and with her diet and weight loss.  No chest pain or headache was noted.  Assessment/Plan:   1. Essential hypertension Patient will continue amlodipine 5 mg once daily, and we will refill for 1 month.  - amLODipine (NORVASC) 5 MG tablet; Take 1 tablet (5 mg total) by mouth daily.  Dispense: 30 tablet; Refill: 0  2. BMI 40.0-44.9, adult (HCC)  - phentermine 15 MG capsule; Take 1 capsule (15 mg total) by mouth every morning.  Dispense: 30 capsule; Refill: 0  3. Obesity, Beginning BMI 61.01 Patient will continue phentermine 15 mg every morning, and we will refill for 1 month.  - phentermine 15 MG capsule; Take 1 capsule (15 mg total) by mouth every morning.  Dispense: 30 capsule; Refill: 0  Laura Knox is currently in the action stage of change. As such, her goal is to continue with weight loss efforts. She has agreed to keeping a food journal and adhering to recommended goals of 1500 calories and 100 grams of protein daily.   Patient was shown how to use Chat GPT to help with meal planning and prepping.  Exercise goals: As is.   Behavioral modification  strategies: increasing lean protein intake and meal planning and cooking strategies.  Laura Knox has agreed to follow-up with our clinic in 4 weeks. She was informed of the importance of frequent follow-up visits to maximize her success with intensive lifestyle modifications for her multiple health conditions.   Objective:   Blood pressure 115/66, pulse 89, temperature 97.6 F (36.4 C), height 5\' 8"  (1.727 m), weight 273 lb (123.8 kg), SpO2 98%. Body mass index is 41.51 kg/m.  Lab Results  Component Value Date   CREATININE 0.71 04/15/2023   BUN 18 04/15/2023   NA 138 04/15/2023   K 3.9 04/15/2023   CL 101 04/15/2023   CO2 28 04/15/2023   Lab Results  Component Value Date   ALT 15 02/04/2023   AST 13 02/04/2023   ALKPHOS 93 02/04/2023   BILITOT 0.4 02/04/2023   Lab Results  Component Value Date   HGBA1C 5.7 (H) 04/02/2023   HGBA1C 5.5 02/04/2023   HGBA1C 5.8 (H) 08/12/2022   HGBA1C 5.6 02/06/2022   HGBA1C 5.9 (H) 06/18/2021   Lab Results  Component Value Date   INSULIN 7.4 08/12/2022   INSULIN 16.7 06/18/2021   INSULIN 20.4 12/27/2020   Lab Results  Component Value Date   TSH 0.22 (L) 02/04/2023   Lab Results  Component Value Date   CHOL 120 02/04/2023   HDL 44.60 02/04/2023   LDLCALC 65 02/04/2023   TRIG 53.0 02/04/2023   CHOLHDL 3 02/04/2023  Lab Results  Component Value Date   VD25OH 41.3 08/12/2022   VD25OH 46.7 02/06/2022   VD25OH 76.3 06/18/2021   Lab Results  Component Value Date   WBC 9.1 04/10/2023   HGB 14.3 04/10/2023   HCT 42.8 04/10/2023   MCV 86.8 04/10/2023   PLT 345 04/10/2023   Lab Results  Component Value Date   FERRITIN 88 02/06/2021   Attestation Statements:   Reviewed by clinician on day of visit: allergies, medications, problem list, medical history, surgical history, family history, social history, and previous encounter notes.  Time spent on visit including pre-visit chart review and post-visit care and charting was 30  minutes.   I, Burt Knack, am acting as transcriptionist for Quillian Quince, MD.  I have reviewed the above documentation for accuracy and completeness, and I agree with the above. -  Quillian Quince, MD

## 2023-07-03 ENCOUNTER — Other Ambulatory Visit: Payer: Self-pay | Admitting: Medical

## 2023-07-09 NOTE — Pre-Procedure Instructions (Signed)
Surgical Instructions   Your procedure is scheduled on Monday, September 23rd. Report to [facility] Main Entrance "A" at 05:30 A.M., then check in with the Admitting office. Any questions or running late day of surgery: call [phone]  Questions prior to your surgery date: call [phone], Monday-Friday, 8am-4pm. If you experience any cold or flu symptoms such as cough, fever, chills, shortness of breath, etc. between now and your scheduled surgery, please notify us at the above number.     Remember:  Do not eat after midnight the night before your surgery  You may drink clear liquids until 04:30 AM the morning of your surgery.   Clear liquids allowed are: Water, Non-Citrus Juices (without pulp), Carbonated Beverages, Clear Tea, Black Coffee Only (NO MILK, CREAM OR POWDERED CREAMER of any kind), and Gatorade.  Patient Instructions  The night before surgery:  No food after midnight. ONLY clear liquids after midnight    The day of surgery (if you have diabetes): Drink ONE (1) 12 oz G2 given to you in your pre admission testing appointment by 04:30 AM the morning of surgery. Drink in one sitting. Do not sip.  This drink was given to you during your hospital  pre-op appointment visit.  Nothing else to drink after completing the  12 oz bottle of G2.         If you have questions, please contact your surgeon's office.    Take these medicines the morning of surgery with A SIP OF WATER  amLODipine (NORVASC)  ARMOUR THYROID  colchicine     May take these medicines IF NEEDED: methocarbamol (ROBAXIN-750)     Do NOT take metFORMIN (GLUCOPHAGE) the morning of surgery.  One week prior to surgery, STOP taking any Aspirin (unless otherwise instructed by your surgeon) Aleve, Naproxen, Ibuprofen, Motrin, Advil, Goody's, BC's, all herbal medications, fish oil, and non-prescription vitamins.                     Do NOT Smoke (Tobacco/Vaping) for 24 hours prior to your  procedure.  If you use a CPAP at night, you may bring your mask/headgear for your overnight stay.   You will be asked to remove any contacts, glasses, piercing's, hearing aid's, dentures/partials prior to surgery. Please bring cases for these items if needed.    Patients discharged the day of surgery will not be allowed to drive home, and someone needs to stay with them for 24 hours.  SURGICAL WAITING ROOM VISITATION Patients may have no more than 2 support people in the waiting area - these visitors may rotate.   Pre-op nurse will coordinate an appropriate time for 1 ADULT support person, who may not rotate, to accompany patient in pre-op.  Children under the age of 63 must have an adult with them who is not the patient and must remain in the main waiting area with an adult.  If the patient needs to stay at the hospital during part of their recovery, the visitor guidelines for inpatient rooms apply.  Please refer to the [facility] website for the visitor guidelines for any additional information.   If you received a COVID test during your pre-op visit  it is requested that you wear a mask when out in public, stay away from anyone that may not be feeling well and notify your surgeon if you develop symptoms. If you have been in contact with anyone that has tested positive in the last 10 days please notify you surgeon.  Pre-operative 5 CHG Bathing Instructions   You can play a key role in reducing the risk of infection after surgery. Your skin needs to be as free of germs as possible. You can reduce the number of germs on your skin by washing with CHG (chlorhexidine gluconate) soap before surgery. CHG is an antiseptic soap that kills germs and continues to kill germs even after washing.   DO NOT use if you have an allergy to chlorhexidine/CHG or antibacterial soaps. If your skin becomes reddened or irritated, stop using the CHG and notify one of our RNs at [phone].   Please  shower with the CHG soap starting 4 days before surgery using the following schedule:     Please keep in mind the following:  DO NOT shave, including legs and underarms, starting the day of your first shower.   You may shave your face at any point before/day of surgery.  Place clean sheets on your bed the day you start using CHG soap. Use a clean washcloth (not used since being washed) for each shower. DO NOT sleep with pets once you start using the CHG.   CHG Shower Instructions:  If you choose to wash your hair and private area, wash first with your normal shampoo/soap.  After you use shampoo/soap, rinse your hair and body thoroughly to remove shampoo/soap residue.  Turn the water OFF and apply about 3 tablespoons (45 ml) of CHG soap to a CLEAN washcloth.  Apply CHG soap ONLY FROM YOUR NECK DOWN TO YOUR TOES (washing for 3-5 minutes)  DO NOT use CHG soap on face, private areas, open wounds, or sores.  Pay special attention to the area where your surgery is being performed.  If you are having back surgery, having someone wash your back for you may be helpful. Wait 2 minutes after CHG soap is applied, then you may rinse off the CHG soap.  Pat dry with a clean towel  Put on clean clothes/pajamas   If you choose to wear lotion, please use ONLY the CHG-compatible lotions on the back of this paper.   Additional instructions for the day of surgery: DO NOT APPLY any lotions, deodorants, cologne, or perfumes.   Do not bring valuables to the hospital. [facility] is not responsible for any belongings/valuables. Do not wear nail polish, gel polish, artificial nails, or any other type of covering on natural nails (fingers and toes) Do not wear jewelry or makeup Put on clean/comfortable clothes.  Please brush your teeth.  Ask your nurse before applying any prescription medications to the skin.     CHG Compatible Lotions   Aveeno Moisturizing lotion  Cetaphil Moisturizing Cream  Cetaphil  Moisturizing Lotion  Clairol Herbal Essence Moisturizing Lotion, Dry Skin  Clairol Herbal Essence Moisturizing Lotion, Extra Dry Skin  Clairol Herbal Essence Moisturizing Lotion, Normal Skin  Curel Age Defying Therapeutic Moisturizing Lotion with Alpha Hydroxy  Curel Extreme Care Body Lotion  Curel Soothing Hands Moisturizing Hand Lotion  Curel Therapeutic Moisturizing Cream, Fragrance-Free  Curel Therapeutic Moisturizing Lotion, Fragrance-Free  Curel Therapeutic Moisturizing Lotion, Original Formula  Eucerin Daily Replenishing Lotion  Eucerin Dry Skin Therapy Plus Alpha Hydroxy Crme  Eucerin Dry Skin Therapy Plus Alpha Hydroxy Lotion  Eucerin Original Crme  Eucerin Original Lotion  Eucerin Plus Crme Eucerin Plus Lotion  Eucerin TriLipid Replenishing Lotion  Keri Anti-Bacterial Hand Lotion  Keri Deep Conditioning Original Lotion Dry Skin Formula Softly Scented  Keri Deep Conditioning Original Lotion, Fragrance Free Sensitive Skin Formula  Keri Lotion Fast [employer] Fragrance Free Sensitive Skin Formula  Keri Lotion Fast Absorbing Softly Scented Dry Skin Formula  Keri Original Lotion  Keri Skin Renewal Lotion Keri Silky Smooth Lotion  Keri Silky Smooth Sensitive Skin Lotion  Nivea Body Creamy Conditioning Oil  Nivea Body Extra Enriched Lotion  Nivea Body Original Lotion  Nivea Body Sheer Moisturizing Lotion Nivea Crme  Nivea Skin Firming Lotion  NutraDerm 30 Skin Lotion  NutraDerm Skin Lotion  NutraDerm Therapeutic Skin Cream  NutraDerm Therapeutic Skin Lotion  ProShield Protective Hand Cream  Provon moisturizing lotion  Please read over the following fact sheets that you were given.

## 2023-07-10 ENCOUNTER — Encounter (HOSPITAL_COMMUNITY)
Admission: RE | Admit: 2023-07-10 | Discharge: 2023-07-10 | Disposition: A | Discharge status: Home / Self Care | Payer: BC Managed Care – PPO | Source: Ambulatory Visit | Attending: Orthopaedic Surgery | Admitting: Orthopaedic Surgery

## 2023-07-10 ENCOUNTER — Other Ambulatory Visit: Payer: Self-pay

## 2023-07-10 ENCOUNTER — Encounter (HOSPITAL_COMMUNITY): Payer: Self-pay

## 2023-07-10 VITALS — BP 129/79 | HR 73 | Temp 98.4°F | Resp 17 | Ht 68.0 in | Wt 279.2 lb

## 2023-07-10 DIAGNOSIS — M1711 Unilateral primary osteoarthritis, right knee: Secondary | ICD-10-CM

## 2023-07-10 DIAGNOSIS — Z01812 Encounter for preprocedural laboratory examination: Secondary | ICD-10-CM | POA: Diagnosis not present

## 2023-07-10 DIAGNOSIS — Z01818 Encounter for other preprocedural examination: Secondary | ICD-10-CM

## 2023-07-10 HISTORY — DX: Family history of other specified conditions: Z84.89

## 2023-07-10 HISTORY — DX: Hypothyroidism, unspecified: E03.9

## 2023-07-10 HISTORY — DX: Other complications of anesthesia, initial encounter: T88.59XA

## 2023-07-10 LAB — CBC
HCT: 44 % (ref 36.0–46.0)
Hemoglobin: 14.1 g/dL (ref 12.0–15.0)
MCH: 28.1 pg (ref 26.0–34.0)
MCHC: 32 g/dL (ref 30.0–36.0)
MCV: 87.8 fL (ref 80.0–100.0)
Platelets: 378 10*3/uL (ref 150–400)
RBC: 5.01 MIL/uL (ref 3.87–5.11)
RDW: 13.1 % (ref 11.5–15.5)
WBC: 9.8 10*3/uL (ref 4.0–10.5)
nRBC: 0 % (ref 0.0–0.2)

## 2023-07-10 LAB — BASIC METABOLIC PANEL
Anion gap: 9 (ref 5–15)
BUN: 13 mg/dL (ref 6–20)
CO2: 28 mmol/L (ref 22–32)
Calcium: 8.8 mg/dL — ABNORMAL LOW (ref 8.9–10.3)
Chloride: 104 mmol/L (ref 98–111)
Creatinine, Ser: 0.73 mg/dL (ref 0.44–1.00)
GFR, Estimated: >60 mL/min (ref >60)
Glucose, Bld: 98 mg/dL (ref 70–99)
Potassium: 3.6 mmol/L (ref 3.5–5.1)
Sodium: 141 mmol/L (ref 135–145)

## 2023-07-10 LAB — SURGICAL PCR SCREEN
MRSA, PCR: NEGATIVE
Staphylococcus aureus: NEGATIVE

## 2023-07-10 NOTE — Pre-Procedure Instructions (Addendum)
Surgical Instructions   Your procedure is scheduled on Monday, September 23rd. Report to [facility] Main Entrance "A" at 05:30 A.M., then check in with the Admitting office. Any questions or running late day of surgery: call [phone]  Questions prior to your surgery date: call [phone], Monday-Friday, 8am-4pm. If you experience any cold or flu symptoms such as cough, fever, chills, shortness of breath, etc. between now and your scheduled surgery, please notify us at the above number.     Remember:  Do not eat after midnight the night before your surgery  You may drink clear liquids until 04:30 AM the morning of your surgery.   Clear liquids allowed are: Water, Non-Citrus Juices (without pulp), Carbonated Beverages, Clear Tea, Black Coffee Only (NO MILK, CREAM OR POWDERED CREAMER of any kind), and Gatorade.  Patient Instructions  The night before surgery:  No food after midnight. ONLY clear liquids after midnight    The day of surgery drink the bottle of  G2 Gatorade between 4:00 AM and 4:30 AM- nothing else to drink after 4:30 AM          If you have questions, please contact your surgeon's office.    Take these medicines the morning of surgery with A SIP OF WATER  amLODipine (NORVASC)  ARMOUR THYROID  colchicine     May take these medicines IF NEEDED: methocarbamol (ROBAXIN-750)     Do NOT take metFORMIN (GLUCOPHAGE) the morning of surgery.  One week prior to surgery, STOP taking any Aspirin (unless otherwise instructed by your surgeon) Aleve, Naproxen, Ibuprofen, Motrin, Advil, Goody's, BC's, all herbal medications, fish oil, and non-prescription vitamins, Phentermkkine.                     Do NOT Smoke (Tobacco/Vaping) for 24 hours prior to your procedure.  If you use a CPAP at night, you may bring your mask/headgear for your overnight stay.   You will be asked to remove any contacts, glasses, piercing's, hearing aid's, dentures/partials prior to surgery.  Please bring cases for these items if needed.    Patients discharged the day of surgery will not be allowed to drive home, and someone needs to stay with them for 24 hours.  SURGICAL WAITING ROOM VISITATION Patients may have no more than 2 support people in the waiting area - these visitors may rotate.   Pre-op nurse will coordinate an appropriate time for 1 ADULT support person, who may not rotate, to accompany patient in pre-op.  Children under the age of 31 must have an adult with them who is not the patient and must remain in the main waiting area with an adult.  If the patient needs to stay at the hospital during part of their recovery, the visitor guidelines for inpatient rooms apply.  Please refer to the [facility] website for the visitor guidelines for any additional information.   If you received a COVID test during your pre-op visit  it is requested that you wear a mask when out in public, stay away from anyone that may not be feeling well and notify your surgeon if you develop symptoms. If you have been in contact with anyone that has tested positive in the last 10 days please notify you surgeon.      Pre-operative 5 CHG Bathing Instructions   You can play a key role in reducing the risk of infection after surgery. Your skin needs to be as free of germs as possible. You can reduce  the number of germs on your skin by washing with CHG (chlorhexidine gluconate) soap before surgery. CHG is an antiseptic soap that kills germs and continues to kill germs even after washing.   DO NOT use if you have an allergy to chlorhexidine/CHG or antibacterial soaps. If your skin becomes reddened or irritated, stop using the CHG and notify one of our RNs at [phone].   Please shower with the CHG soap starting 4 days before surgery using the following schedule:     Please keep in mind the following:  DO NOT shave, including legs and underarms, starting the day of your first shower.   You  may shave your face at any point before/day of surgery.  Place clean sheets on your bed the day you start using CHG soap. Use a clean washcloth (not used since being washed) for each shower. DO NOT sleep with pets once you start using the CHG.   CHG Shower Instructions:  If you choose to wash your hair and private area, wash first with your normal shampoo/soap.  After you use shampoo/soap, rinse your hair and body thoroughly to remove shampoo/soap residue.  Turn the water OFF and apply about 3 tablespoons (45 ml) of CHG soap to a CLEAN washcloth.  Apply CHG soap ONLY FROM YOUR NECK DOWN TO YOUR TOES (washing for 3-5 minutes)  DO NOT use CHG soap on face, private areas, open wounds, or sores.  Pay special attention to the area where your surgery is being performed.  If you are having back surgery, having someone wash your back for you may be helpful. Wait 2 minutes after CHG soap is applied, then you may rinse off the CHG soap.  Pat dry with a clean towel  Put on clean clothes/pajamas   If you choose to wear lotion, please use ONLY the CHG-compatible lotions on the back of this paper.   Additional instructions for the day of surgery: DO NOT APPLY any lotions, deodorants, cologne, or perfumes.   Do not bring valuables to the hospital. [facility] is not responsible for any belongings/valuables. Do not wear nail polish, gel polish, artificial nails, or any other type of covering on natural nails (fingers and toes) Do not wear jewelry or makeup Put on clean/comfortable clothes.  Please brush your teeth.  Ask your nurse before applying any prescription medications to the skin.     CHG Compatible Lotions   Aveeno Moisturizing lotion  Cetaphil Moisturizing Cream  Cetaphil Moisturizing Lotion  Clairol Herbal Essence Moisturizing Lotion, Dry Skin  Clairol Herbal Essence Moisturizing Lotion, Extra Dry Skin  Clairol Herbal Essence Moisturizing Lotion, Normal Skin  Curel Age Defying  Therapeutic Moisturizing Lotion with Alpha Hydroxy  Curel Extreme Care Body Lotion  Curel Soothing Hands Moisturizing Hand Lotion  Curel Therapeutic Moisturizing Cream, Fragrance-Free  Curel Therapeutic Moisturizing Lotion, Fragrance-Free  Curel Therapeutic Moisturizing Lotion, Original Formula  Eucerin Daily Replenishing Lotion  Eucerin Dry Skin Therapy Plus Alpha Hydroxy Crme  Eucerin Dry Skin Therapy Plus Alpha Hydroxy Lotion  Eucerin Original Crme  Eucerin Original Lotion  Eucerin Plus Crme Eucerin Plus Lotion  Eucerin TriLipid Replenishing Lotion  Keri Anti-Bacterial Hand Lotion  Keri Deep Conditioning Original Lotion Dry Skin Formula Softly Scented  Keri Deep Conditioning Original Lotion, Fragrance Free Sensitive Skin Formula  Keri Lotion Fast Absorbing Fragrance Free Sensitive Skin Formula  Keri Lotion Fast Absorbing Softly Scented Dry Skin Formula  Keri Original Lotion  Keri Skin Renewal Lotion Keri Silky Smooth Lotion  [place]  Smooth Sensitive Skin Lotion  Nivea Body Creamy Conditioning Oil  Nivea Body Extra Enriched Lotion  Nivea Body Original Lotion  Nivea Body Sheer Moisturizing Lotion Nivea Crme  Nivea Skin Firming Lotion  NutraDerm 30 Skin Lotion  NutraDerm Skin Lotion  NutraDerm Therapeutic Skin Cream  NutraDerm Therapeutic Skin Lotion  ProShield Protective Hand Cream  Provon moisturizing lotion  Please read over the following fact sheets that you were given.

## 2023-07-11 LAB — HEMOGLOBIN A1C
Hgb A1c MFr Bld: 5.6 % (ref 4.8–5.6)
Mean Plasma Glucose: 114 mg/dL

## 2023-07-15 ENCOUNTER — Other Ambulatory Visit: Payer: Self-pay | Admitting: Physician Assistant

## 2023-07-15 ENCOUNTER — Encounter: Payer: Self-pay | Admitting: Orthopaedic Surgery

## 2023-07-15 MED ORDER — ONDANSETRON HCL 4 MG PO TABS: 4.0000 mg | ORAL_TABLET | Freq: Three times a day (TID) | ORAL | 0 refills | Status: DC | PRN

## 2023-07-15 MED ORDER — DOXYCYCLINE HYCLATE 100 MG PO CAPS: 100.0000 mg | ORAL_CAPSULE | Freq: Two times a day (BID) | ORAL | 0 refills | Status: DC

## 2023-07-15 MED ORDER — HYDROCODONE-ACETAMINOPHEN 7.5-325 MG PO TABS: 1.0000 | ORAL_TABLET | Freq: Four times a day (QID) | ORAL | 0 refills | Status: DC | PRN

## 2023-07-15 MED ORDER — METHOCARBAMOL 750 MG PO TABS: 750.0000 mg | ORAL_TABLET | Freq: Two times a day (BID) | ORAL | 2 refills | Status: DC | PRN

## 2023-07-15 MED ORDER — DOCUSATE SODIUM 100 MG PO CAPS: 100.0000 mg | ORAL_CAPSULE | Freq: Every day | ORAL | 2 refills | Status: DC | PRN

## 2023-07-15 MED ORDER — ASPIRIN 81 MG PO TBEC: 81.0000 mg | DELAYED_RELEASE_TABLET | Freq: Two times a day (BID) | ORAL | 0 refills | Status: DC

## 2023-07-16 ENCOUNTER — Encounter (INDEPENDENT_AMBULATORY_CARE_PROVIDER_SITE_OTHER): Payer: Self-pay | Admitting: *Deleted

## 2023-07-17 ENCOUNTER — Ambulatory Visit (INDEPENDENT_AMBULATORY_CARE_PROVIDER_SITE_OTHER): Payer: BC Managed Care – PPO | Admitting: Family Medicine

## 2023-07-17 ENCOUNTER — Encounter (INDEPENDENT_AMBULATORY_CARE_PROVIDER_SITE_OTHER): Payer: Self-pay | Admitting: Family Medicine

## 2023-07-17 DIAGNOSIS — Z6841 Body Mass Index (BMI) 40.0 and over, adult: Secondary | ICD-10-CM

## 2023-07-17 DIAGNOSIS — R7303 Prediabetes: Secondary | ICD-10-CM | POA: Diagnosis not present

## 2023-07-17 DIAGNOSIS — I10 Essential (primary) hypertension: Secondary | ICD-10-CM

## 2023-07-17 DIAGNOSIS — E669 Obesity, unspecified: Secondary | ICD-10-CM | POA: Diagnosis not present

## 2023-07-17 MED ORDER — METFORMIN HCL 500 MG PO TABS
ORAL_TABLET | ORAL | 1 refills | Status: DC
Start: 2023-07-17 — End: 2023-11-04

## 2023-07-17 NOTE — Progress Notes (Addendum)
.[facility]  Office: [phone]  /  Fax: [phone]  WEIGHT SUMMARY AND BIOMETRICS  Anthropometric Measurements Height: 5\' 8"  (1.727 m) Weight: 275 lb (124.7 kg) BMI (Calculated): 41.82 Weight at Last Visit: 273 lb Weight Lost Since Last Visit: 0 Weight Gained Since Last Visit: 2 lb Starting Weight: 378 lb Total Weight Loss (lbs): 103 lb (46.7 kg)   Body Composition  Body Fat %: 52.1 % Fat Mass (lbs): 143.4 lbs Muscle Mass (lbs): 125.2 lbs Visceral Fat Rating : 17   Other Clinical Data Fasting: Yes Labs: No Today's Visit #: 42 Starting Date: 12/27/20    Chief Complaint: OBESITY  History of Present Illness   The patient, with a history of obesity, presents for a follow-up visit after a two-week interval, during which she gained two pounds. She reports adherence to a pescetarian diet approximately 50% of the time and engages in water exercises and strengthening activities for 60 minutes daily.  The patient is preparing for an upcoming knee replacement surgery scheduled for the following Monday. She plans to continue her exercise regimen until the day of the surgery. She expresses some concern with the delay in communication from her surgeon regarding post-operative care and physical therapy plans. She has not yet scheduled a post-operative appointment and has concerns about the lack of recent imaging of her right knee.  The patient has a strong support network, including a brother who will be staying with her for a week post-surgery. She has planned for a healthier post-operative diet and has arranged for friends to visit and engage in activities such as paper art and music.  The patient acknowledges the mental struggle of temporarily giving up her increased mobility for the surgery but is optimistic about the future benefits. She has a strategy of recognizing and celebrating activities she will not be able to do for a while due to the surgery.  The patient reports an increase  in muscle mass of two pounds, which she views positively. She is on medication for blood pressure, which was elevated at this visit, possibly due to pain from a flare-up in her right knee or a missed dose of medication. She also takes metformin, which she will stop on the day of surgery.  She is committed to her health journey and expresses satisfaction with her progress so far.          PHYSICAL EXAM:  Blood pressure (!) 155/81, pulse 64, temperature 97.9 F (36.6 C), height 5\' 8"  (1.727 m), weight 275 lb (124.7 kg), SpO2 97%. Body mass index is 41.81 kg/m.  DIAGNOSTIC DATA REVIEWED:  BMET    Component Value Date/Time   NA 141 07/10/2023 1524   NA 142 08/12/2022 1255   K 3.6 07/10/2023 1524   CL 104 07/10/2023 1524   CO2 28 07/10/2023 1524   GLUCOSE 98 07/10/2023 1524   BUN 13 07/10/2023 1524   BUN 17 08/12/2022 1255   CREATININE 0.73 07/10/2023 1524   CALCIUM 8.8 (L) 07/10/2023 1524   GFRNONAA >60 07/10/2023 1524   GFRAA 128 02/06/2021 0000   Lab Results  Component Value Date   HGBA1C 5.6 07/10/2023   HGBA1C 6.0 (H) 12/27/2020   Lab Results  Component Value Date   INSULIN 7.4 08/12/2022   INSULIN 20.4 12/27/2020   Lab Results  Component Value Date   TSH 0.22 (L) 02/04/2023   CBC    Component Value Date/Time   WBC 9.8 07/10/2023 1524   RBC 5.01 07/10/2023 1524   HGB  14.1 07/10/2023 1524   HGB 13.7 12/27/2020 0848   HCT 44.0 07/10/2023 1524   HCT 41.2 12/27/2020 0848   PLT 378 07/10/2023 1524   PLT 305 12/27/2020 0848   MCV 87.8 07/10/2023 1524   MCV 87 12/27/2020 0848   MCH 28.1 07/10/2023 1524   MCHC 32.0 07/10/2023 1524   RDW 13.1 07/10/2023 1524   RDW 13.2 12/27/2020 0848   Iron Studies    Component Value Date/Time   FERRITIN 88 02/06/2021 0000   Lipid Panel     Component Value Date/Time   CHOL 120 02/04/2023 0715   CHOL 141 12/27/2020 0848   TRIG 53.0 02/04/2023 0715   HDL 44.60 02/04/2023 0715   HDL 44 12/27/2020 0848   CHOLHDL 3  02/04/2023 0715   VLDL 10.6 02/04/2023 0715   LDLCALC 65 02/04/2023 0715   LDLCALC 81 12/27/2020 0848   Hepatic Function Panel     Component Value Date/Time   PROT 6.5 02/04/2023 0715   PROT 7.0 08/12/2022 1255   ALBUMIN 3.9 02/04/2023 0715   ALBUMIN 4.1 08/12/2022 1255   AST 13 02/04/2023 0715   ALT 15 02/04/2023 0715   ALKPHOS 93 02/04/2023 0715   BILITOT 0.4 02/04/2023 0715   BILITOT 0.6 08/12/2022 1255      Component Value Date/Time   TSH 0.22 (L) 02/04/2023 0715   Nutritional Lab Results  Component Value Date   VD25OH 41.3 08/12/2022   VD25OH 46.7 02/06/2022   VD25OH 76.3 06/18/2021     Assessment and Plan    Obesity Gained 2 pounds in the last two weeks. Following a pescetarian diet approximately 50% of the time and exercising for 60 minutes seven times per week. Preparing for knee surgery which may impact exercise routine. -Continue current diet and exercise regimen as tolerated. -Plan for potential changes in eating routine due to upcoming surgery. -Will hold phentermine until she is off all pain meds  Knee Pain Right knee pain and stiffness, possibly due to overuse. Upcoming knee surgery scheduled for next Monday. -Continue current pain management strategies. -Ensure post-operative care and physical therapy plans are in place with the surgeon by contacting their office to make sure everything is ready for her surgery.  Hypertension Blood pressure elevated, possibly due to missed medication dose or pain. -Ensure consistent use of prescribed blood pressure medication. -Will recheck BP in 3-4 weeks.  General Health Maintenance -Continue Metformin as prescribed, except on the day of surgery. -Resume Phentermine after surgery when pain medication is reduced. -Schedule follow-up appointment three weeks post-surgery, with the option to convert to telehealth if necessary.         I have personally spent 40 minutes total time today in preparation, patient  care, and documentation for this visit, including the following: review of clinical lab tests; review of medical tests/procedures/services.    She was informed of the importance of frequent follow up visits to maximize her success with intensive lifestyle modifications for her multiple health conditions.    Quillian Quince, MD

## 2023-07-18 MED ORDER — TRANEXAMIC ACID 1000 MG/10ML IV SOLN
2000.0000 mg | INTRAVENOUS | Status: AC
  Administered 2023-07-21: 2000 mg via TOPICAL
  Filled 2023-07-18: qty 20

## 2023-07-20 NOTE — Anesthesia Preprocedure Evaluation (Signed)
Anesthesia Evaluation  Patient identified by MRN, date of birth, ID band Patient awake    Reviewed: Allergy & Precautions, NPO status , Patient's Chart, lab work & pertinent test results  Airway Mallampati: II  TM Distance: >3 FB Neck ROM: Full    Dental no notable dental hx. (+) Teeth Intact, Dental Advisory Given   Pulmonary sleep apnea    Pulmonary exam normal breath sounds clear to auscultation       Cardiovascular hypertension, Pt. on medications Normal cardiovascular exam Rhythm:Regular Rate:Normal     Neuro/Psych    GI/Hepatic   Endo/Other  diabetesHypothyroidism    Renal/GU      Musculoskeletal  (+) Arthritis , Osteoarthritis,    Abdominal  (+) + obese  Peds  Hematology Lab Results      Component                Value               Date                      WBC                      9.8                 07/10/2023                HGB                      14.1                07/10/2023                HCT                      44.0                07/10/2023                MCV                      87.8                07/10/2023                PLT                      378                 07/10/2023              Anesthesia Other Findings All : Chlorthalidone  Reproductive/Obstetrics                             Anesthesia Physical Anesthesia Plan  ASA: 3  Anesthesia Plan: Spinal and Regional   Post-op Pain Management: Regional block* and Minimal or no pain anticipated   Induction:   PONV Risk Score and Plan: 3 and Treatment may vary due to age or medical condition, Midazolam, Ondansetron and Propofol infusion  Airway Management Planned: Natural Airway and Nasal Cannula  Additional Equipment: None  Intra-op Plan:   Post-operative Plan:   Informed Consent:      Dental advisory given  Plan Discussed with:   Anesthesia Plan Comments: (Spinal w R adductor canal Block)  Anesthesia Quick Evaluation

## 2023-07-21 ENCOUNTER — Ambulatory Visit (HOSPITAL_COMMUNITY): Payer: BC Managed Care – PPO | Admitting: Anesthesiology

## 2023-07-21 ENCOUNTER — Observation Stay (HOSPITAL_COMMUNITY)
Admission: RE | Admit: 2023-07-21 | Discharge: 2023-07-22 | Disposition: A | Discharge status: Home / Self Care | Payer: BC Managed Care – PPO | Attending: Orthopaedic Surgery | Admitting: Orthopaedic Surgery

## 2023-07-21 ENCOUNTER — Other Ambulatory Visit: Payer: Self-pay

## 2023-07-21 ENCOUNTER — Encounter (HOSPITAL_COMMUNITY): Payer: Self-pay | Admitting: Orthopaedic Surgery

## 2023-07-21 ENCOUNTER — Observation Stay (HOSPITAL_COMMUNITY): Payer: BC Managed Care – PPO

## 2023-07-21 ENCOUNTER — Other Ambulatory Visit: Payer: Self-pay | Admitting: Physician Assistant

## 2023-07-21 ENCOUNTER — Encounter (HOSPITAL_COMMUNITY)
Admission: RE | Disposition: A | Discharge status: Home / Self Care | Payer: Self-pay | Source: Home / Self Care | Attending: Orthopaedic Surgery

## 2023-07-21 DIAGNOSIS — Z96652 Presence of left artificial knee joint: Secondary | ICD-10-CM | POA: Insufficient documentation

## 2023-07-21 DIAGNOSIS — Z96651 Presence of right artificial knee joint: Secondary | ICD-10-CM | POA: Diagnosis not present

## 2023-07-21 DIAGNOSIS — Z01818 Encounter for other preprocedural examination: Secondary | ICD-10-CM

## 2023-07-21 DIAGNOSIS — Z7982 Long term (current) use of aspirin: Secondary | ICD-10-CM | POA: Insufficient documentation

## 2023-07-21 DIAGNOSIS — E039 Hypothyroidism, unspecified: Secondary | ICD-10-CM | POA: Diagnosis not present

## 2023-07-21 DIAGNOSIS — Z7984 Long term (current) use of oral hypoglycemic drugs: Secondary | ICD-10-CM | POA: Diagnosis not present

## 2023-07-21 DIAGNOSIS — M1711 Unilateral primary osteoarthritis, right knee: Secondary | ICD-10-CM | POA: Diagnosis not present

## 2023-07-21 DIAGNOSIS — Z79899 Other long term (current) drug therapy: Secondary | ICD-10-CM | POA: Diagnosis not present

## 2023-07-21 DIAGNOSIS — G8918 Other acute postprocedural pain: Secondary | ICD-10-CM | POA: Diagnosis not present

## 2023-07-21 DIAGNOSIS — I10 Essential (primary) hypertension: Secondary | ICD-10-CM | POA: Insufficient documentation

## 2023-07-21 DIAGNOSIS — R609 Edema, unspecified: Secondary | ICD-10-CM | POA: Diagnosis not present

## 2023-07-21 HISTORY — PX: TOTAL KNEE ARTHROPLASTY: SHX125

## 2023-07-21 SURGERY — ARTHROPLASTY, KNEE, TOTAL
Anesthesia: Regional | Site: Knee | Laterality: Right

## 2023-07-21 MED ORDER — LIDOCAINE 2% (20 MG/ML) 5 ML SYRINGE
INTRAMUSCULAR | Status: DC | PRN
  Administered 2023-07-21: 80 mg via INTRAVENOUS

## 2023-07-21 MED ORDER — HYDROCODONE-ACETAMINOPHEN 7.5-325 MG PO TABS
1.0000 | ORAL_TABLET | ORAL | Status: DC | PRN
  Administered 2023-07-21: 1 via ORAL

## 2023-07-21 MED ORDER — KETOROLAC TROMETHAMINE 15 MG/ML IJ SOLN
15.0000 mg | Freq: Four times a day (QID) | INTRAMUSCULAR | Status: AC
  Administered 2023-07-21 – 2023-07-22 (×4): 15 mg via INTRAVENOUS
  Filled 2023-07-21 (×3): qty 1

## 2023-07-21 MED ORDER — AMLODIPINE BESYLATE 5 MG PO TABS
5.0000 mg | ORAL_TABLET | Freq: Every day | ORAL | Status: DC
  Administered 2023-07-22: 5 mg via ORAL
  Filled 2023-07-21: qty 1

## 2023-07-21 MED ORDER — FENTANYL CITRATE (PF) 250 MCG/5ML IJ SOLN
INTRAMUSCULAR | Status: DC | PRN
  Administered 2023-07-21: 50 ug via INTRAVENOUS
  Administered 2023-07-21 (×2): 25 ug via INTRAVENOUS
  Administered 2023-07-21: 50 ug via INTRAVENOUS
  Administered 2023-07-21: 25 ug via INTRAVENOUS

## 2023-07-21 MED ORDER — CEFAZOLIN IN SODIUM CHLORIDE 3-0.9 GM/100ML-% IV SOLN: 3.0000 g | INTRAVENOUS | Status: AC

## 2023-07-21 MED ORDER — HYDROCODONE-ACETAMINOPHEN 5-325 MG PO TABS: 1.0000 | ORAL_TABLET | ORAL | Status: DC | PRN

## 2023-07-21 MED ORDER — PHENYLEPHRINE HCL-NACL 20-0.9 MG/250ML-% IV SOLN
INTRAVENOUS | Status: DC | PRN
  Administered 2023-07-21: 25 ug/min via INTRAVENOUS

## 2023-07-21 MED ORDER — PRONTOSAN WOUND IRRIGATION OPTIME
TOPICAL | Status: DC | PRN
  Administered 2023-07-21: 1 via TOPICAL

## 2023-07-21 MED ORDER — SODIUM CHLORIDE 0.9 % IR SOLN
Status: DC | PRN
  Administered 2023-07-21: 1000 mL

## 2023-07-21 MED ORDER — KETOROLAC TROMETHAMINE 15 MG/ML IJ SOLN
INTRAMUSCULAR | Status: AC
  Filled 2023-07-21: qty 1

## 2023-07-21 MED ORDER — DROPERIDOL 2.5 MG/ML IJ SOLN: 0.6250 mg | Freq: Once | INTRAMUSCULAR | Status: DC | PRN

## 2023-07-21 MED ORDER — CEFAZOLIN IN SODIUM CHLORIDE 3-0.9 GM/100ML-% IV SOLN
INTRAVENOUS | Status: AC
  Filled 2023-07-21: qty 100

## 2023-07-21 MED ORDER — CHLORHEXIDINE GLUCONATE 0.12 % MT SOLN: 15.0000 mL | Freq: Once | OROMUCOSAL | Status: AC

## 2023-07-21 MED ORDER — PROPOFOL 10 MG/ML IV BOLUS
INTRAVENOUS | Status: DC | PRN
  Administered 2023-07-21: 30 ug via INTRAVENOUS
  Administered 2023-07-21: 100 ug/kg/min via INTRAVENOUS
  Administered 2023-07-21: 50 mg via INTRAVENOUS
  Administered 2023-07-21: 30 ug via INTRAVENOUS
  Administered 2023-07-21 (×3): 50 mg via INTRAVENOUS

## 2023-07-21 MED ORDER — LIDOCAINE 2% (20 MG/ML) 5 ML SYRINGE
INTRAMUSCULAR | Status: AC
  Filled 2023-07-21: qty 5

## 2023-07-21 MED ORDER — MENTHOL 3 MG MT LOZG: 1.0000 | LOZENGE | OROMUCOSAL | Status: DC | PRN

## 2023-07-21 MED ORDER — ROPIVACAINE HCL 5 MG/ML IJ SOLN
INTRAMUSCULAR | Status: DC | PRN
Start: 2023-07-21 — End: 2023-07-21
  Administered 2023-07-21: 30 mL via PERINEURAL

## 2023-07-21 MED ORDER — RIVAROXABAN 10 MG PO TABS: 10.0000 mg | ORAL_TABLET | Freq: Every day | ORAL | 0 refills | Status: DC

## 2023-07-21 MED ORDER — ONDANSETRON HCL 4 MG/2ML IJ SOLN: 4.0000 mg | Freq: Once | INTRAMUSCULAR | Status: DC | PRN

## 2023-07-21 MED ORDER — TRANEXAMIC ACID-NACL 1000-0.7 MG/100ML-% IV SOLN
INTRAVENOUS | Status: AC
  Filled 2023-07-21: qty 100

## 2023-07-21 MED ORDER — POLYETHYLENE GLYCOL 3350 17 G PO PACK
17.0000 g | PACK | Freq: Every day | ORAL | Status: DC
  Administered 2023-07-21 – 2023-07-22 (×2): 17 g via ORAL
  Filled 2023-07-21 (×2): qty 1

## 2023-07-21 MED ORDER — RIVAROXABAN 10 MG PO TABS
10.0000 mg | ORAL_TABLET | Freq: Every day | ORAL | Status: DC
  Administered 2023-07-22: 10 mg via ORAL
  Filled 2023-07-21: qty 1

## 2023-07-21 MED ORDER — DEXTROSE 5 % IV SOLN
INTRAVENOUS | Status: DC | PRN
  Administered 2023-07-21: 3 g via INTRAVENOUS

## 2023-07-21 MED ORDER — FERROUS SULFATE 325 (65 FE) MG PO TABS
325.0000 mg | ORAL_TABLET | Freq: Three times a day (TID) | ORAL | Status: DC
  Administered 2023-07-21 – 2023-07-22 (×3): 325 mg via ORAL
  Filled 2023-07-21 (×3): qty 1

## 2023-07-21 MED ORDER — LOSARTAN POTASSIUM 50 MG PO TABS
100.0000 mg | ORAL_TABLET | Freq: Every day | ORAL | Status: DC
  Administered 2023-07-21: 100 mg via ORAL
  Filled 2023-07-21: qty 2

## 2023-07-21 MED ORDER — PROPOFOL 10 MG/ML IV BOLUS
INTRAVENOUS | Status: AC
  Filled 2023-07-21: qty 20

## 2023-07-21 MED ORDER — MIDAZOLAM HCL 2 MG/2ML IJ SOLN
INTRAMUSCULAR | Status: DC | PRN
  Administered 2023-07-21: 2 mg via INTRAVENOUS

## 2023-07-21 MED ORDER — CHLORHEXIDINE GLUCONATE 0.12 % MT SOLN
OROMUCOSAL | Status: AC
  Administered 2023-07-21: 15 mL via OROMUCOSAL
  Filled 2023-07-21: qty 15

## 2023-07-21 MED ORDER — CEFAZOLIN IN SODIUM CHLORIDE 3-0.9 GM/100ML-% IV SOLN
3.0000 g | Freq: Four times a day (QID) | INTRAVENOUS | Status: AC
  Administered 2023-07-21 (×2): 3 g via INTRAVENOUS
  Filled 2023-07-21 (×2): qty 100

## 2023-07-21 MED ORDER — ONDANSETRON HCL 4 MG PO TABS: 4.0000 mg | ORAL_TABLET | Freq: Four times a day (QID) | ORAL | Status: DC | PRN

## 2023-07-21 MED ORDER — MIDAZOLAM HCL 2 MG/2ML IJ SOLN
INTRAMUSCULAR | Status: AC
  Filled 2023-07-21: qty 2

## 2023-07-21 MED ORDER — PHENOL 1.4 % MT LIQD: 1.0000 | OROMUCOSAL | Status: DC | PRN

## 2023-07-21 MED ORDER — BUPIVACAINE IN DEXTROSE 0.75-8.25 % IT SOLN
INTRATHECAL | Status: DC | PRN
  Administered 2023-07-21: 12.75 mg via INTRATHECAL

## 2023-07-21 MED ORDER — ACETAMINOPHEN 325 MG PO TABS
325.0000 mg | ORAL_TABLET | Freq: Four times a day (QID) | ORAL | Status: DC | PRN
  Administered 2023-07-22: 650 mg via ORAL
  Filled 2023-07-21: qty 2

## 2023-07-21 MED ORDER — FENTANYL CITRATE (PF) 250 MCG/5ML IJ SOLN
INTRAMUSCULAR | Status: AC
  Filled 2023-07-21: qty 5

## 2023-07-21 MED ORDER — HYDROMORPHONE HCL 1 MG/ML IJ SOLN
1.0000 mg | INTRAMUSCULAR | Status: DC | PRN
  Administered 2023-07-21 – 2023-07-22 (×4): 1 mg via INTRAVENOUS
  Filled 2023-07-21 (×4): qty 1

## 2023-07-21 MED ORDER — METOCLOPRAMIDE HCL 5 MG/ML IJ SOLN: 5.0000 mg | Freq: Three times a day (TID) | INTRAMUSCULAR | Status: DC | PRN

## 2023-07-21 MED ORDER — OXYCODONE HCL 5 MG PO TABS
10.0000 mg | ORAL_TABLET | ORAL | Status: DC | PRN
  Administered 2023-07-21 – 2023-07-22 (×6): 10 mg via ORAL
  Filled 2023-07-21 (×6): qty 2

## 2023-07-21 MED ORDER — THYROID 60 MG PO TABS
120.0000 mg | ORAL_TABLET | Freq: Every day | ORAL | Status: DC
  Administered 2023-07-22: 120 mg via ORAL
  Filled 2023-07-21: qty 2

## 2023-07-21 MED ORDER — HYDROCODONE-ACETAMINOPHEN 7.5-325 MG PO TABS
ORAL_TABLET | ORAL | Status: AC
  Filled 2023-07-21: qty 1

## 2023-07-21 MED ORDER — TRANEXAMIC ACID-NACL 1000-0.7 MG/100ML-% IV SOLN
1000.0000 mg | INTRAVENOUS | Status: AC
  Administered 2023-07-21: 1000 mg via INTRAVENOUS

## 2023-07-21 MED ORDER — OXYCODONE HCL ER 10 MG PO T12A
10.0000 mg | EXTENDED_RELEASE_TABLET | Freq: Two times a day (BID) | ORAL | Status: AC
  Administered 2023-07-21 – 2023-07-22 (×2): 10 mg via ORAL
  Filled 2023-07-21: qty 1

## 2023-07-21 MED ORDER — OXYCODONE HCL 5 MG/5ML PO SOLN: 5.0000 mg | Freq: Once | ORAL | Status: DC | PRN

## 2023-07-21 MED ORDER — TRANEXAMIC ACID-NACL 1000-0.7 MG/100ML-% IV SOLN
1000.0000 mg | Freq: Once | INTRAVENOUS | Status: AC
  Administered 2023-07-21: 1000 mg via INTRAVENOUS
  Filled 2023-07-21: qty 100

## 2023-07-21 MED ORDER — ACETAMINOPHEN 500 MG PO TABS
500.0000 mg | ORAL_TABLET | Freq: Four times a day (QID) | ORAL | Status: DC
  Administered 2023-07-21 (×3): 500 mg via ORAL
  Filled 2023-07-21 (×4): qty 1

## 2023-07-21 MED ORDER — ONDANSETRON HCL 4 MG/2ML IJ SOLN
INTRAMUSCULAR | Status: AC
  Filled 2023-07-21: qty 2

## 2023-07-21 MED ORDER — SODIUM CHLORIDE 0.9 % IV SOLN: INTRAVENOUS | Status: DC

## 2023-07-21 MED ORDER — LACTATED RINGERS IV SOLN: INTRAVENOUS | Status: DC

## 2023-07-21 MED ORDER — DEXAMETHASONE SODIUM PHOSPHATE 10 MG/ML IJ SOLN
INTRAMUSCULAR | Status: AC
  Filled 2023-07-21: qty 1

## 2023-07-21 MED ORDER — BUPIVACAINE-MELOXICAM ER 400-12 MG/14ML IJ SOLN
INTRAMUSCULAR | Status: AC
  Filled 2023-07-21: qty 1

## 2023-07-21 MED ORDER — DOCUSATE SODIUM 100 MG PO CAPS
100.0000 mg | ORAL_CAPSULE | Freq: Two times a day (BID) | ORAL | Status: DC
  Administered 2023-07-21 – 2023-07-22 (×3): 100 mg via ORAL
  Filled 2023-07-21 (×3): qty 1

## 2023-07-21 MED ORDER — 0.9 % SODIUM CHLORIDE (POUR BTL) OPTIME
TOPICAL | Status: DC | PRN
  Administered 2023-07-21: 1000 mL

## 2023-07-21 MED ORDER — METHOCARBAMOL 1000 MG/10ML IJ SOLN: 500.0000 mg | Freq: Four times a day (QID) | INTRAVENOUS | Status: DC | PRN

## 2023-07-21 MED ORDER — VANCOMYCIN HCL 1000 MG IV SOLR
INTRAVENOUS | Status: AC
  Filled 2023-07-21: qty 20

## 2023-07-21 MED ORDER — VANCOMYCIN HCL 1000 MG IV SOLR
INTRAVENOUS | Status: DC | PRN
  Administered 2023-07-21: 1000 mg via TOPICAL

## 2023-07-21 MED ORDER — METOCLOPRAMIDE HCL 5 MG PO TABS: 5.0000 mg | ORAL_TABLET | Freq: Three times a day (TID) | ORAL | Status: DC | PRN

## 2023-07-21 MED ORDER — DEXAMETHASONE SODIUM PHOSPHATE 10 MG/ML IJ SOLN: 10.0000 mg | Freq: Once | INTRAMUSCULAR | Status: DC

## 2023-07-21 MED ORDER — BUPIVACAINE-MELOXICAM ER 400-12 MG/14ML IJ SOLN
INTRAMUSCULAR | Status: DC | PRN
  Administered 2023-07-21: 400 mg

## 2023-07-21 MED ORDER — MORPHINE SULFATE (PF) 2 MG/ML IV SOLN
0.5000 mg | INTRAVENOUS | Status: DC | PRN
  Administered 2023-07-21: 1 mg via INTRAVENOUS
  Filled 2023-07-21: qty 1

## 2023-07-21 MED ORDER — HYDROMORPHONE HCL 1 MG/ML IJ SOLN: 0.2500 mg | INTRAMUSCULAR | Status: DC | PRN

## 2023-07-21 MED ORDER — DIPHENHYDRAMINE HCL 25 MG PO CAPS
25.0000 mg | ORAL_CAPSULE | Freq: Four times a day (QID) | ORAL | Status: DC | PRN
  Administered 2023-07-21: 25 mg via ORAL
  Filled 2023-07-21: qty 1

## 2023-07-21 MED ORDER — METHOCARBAMOL 500 MG PO TABS
500.0000 mg | ORAL_TABLET | Freq: Four times a day (QID) | ORAL | Status: DC | PRN
  Administered 2023-07-21 – 2023-07-22 (×5): 500 mg via ORAL
  Filled 2023-07-21 (×4): qty 1

## 2023-07-21 MED ORDER — POVIDONE-IODINE 10 % EX SWAB
2.0000 | Freq: Once | CUTANEOUS | Status: AC
  Administered 2023-07-21: 2 via TOPICAL

## 2023-07-21 MED ORDER — DEXAMETHASONE SODIUM PHOSPHATE 10 MG/ML IJ SOLN
INTRAMUSCULAR | Status: DC | PRN
  Administered 2023-07-21: 10 mg via INTRAVENOUS

## 2023-07-21 MED ORDER — ACETAMINOPHEN 10 MG/ML IV SOLN: 1000.0000 mg | Freq: Once | INTRAVENOUS | Status: DC | PRN

## 2023-07-21 MED ORDER — DOXYCYCLINE HYCLATE 100 MG PO TABS
100.0000 mg | ORAL_TABLET | Freq: Two times a day (BID) | ORAL | Status: DC
  Administered 2023-07-22: 100 mg via ORAL
  Filled 2023-07-21: qty 1

## 2023-07-21 MED ORDER — METHOCARBAMOL 500 MG PO TABS
ORAL_TABLET | ORAL | Status: AC
  Filled 2023-07-21: qty 1

## 2023-07-21 MED ORDER — ONDANSETRON HCL 4 MG/2ML IJ SOLN
INTRAMUSCULAR | Status: DC | PRN
  Administered 2023-07-21: 4 mg via INTRAVENOUS

## 2023-07-21 MED ORDER — ONDANSETRON HCL 4 MG/2ML IJ SOLN
4.0000 mg | Freq: Four times a day (QID) | INTRAMUSCULAR | Status: DC | PRN
  Administered 2023-07-21: 4 mg via INTRAVENOUS
  Filled 2023-07-21 (×2): qty 2

## 2023-07-21 MED ORDER — ORAL CARE MOUTH RINSE: 15.0000 mL | Freq: Once | OROMUCOSAL | Status: AC

## 2023-07-21 MED ORDER — CLONIDINE HCL (ANALGESIA) 100 MCG/ML EP SOLN
EPIDURAL | Status: DC | PRN
Start: 2023-07-21 — End: 2023-07-21
  Administered 2023-07-21: 100 ug

## 2023-07-21 MED ORDER — DEXAMETHASONE SODIUM PHOSPHATE 10 MG/ML IJ SOLN
10.0000 mg | Freq: Once | INTRAMUSCULAR | Status: AC
  Administered 2023-07-22: 10 mg via INTRAVENOUS
  Filled 2023-07-21: qty 1

## 2023-07-21 MED ORDER — OXYCODONE HCL 5 MG PO TABS: 5.0000 mg | ORAL_TABLET | Freq: Once | ORAL | Status: DC | PRN

## 2023-07-21 SURGICAL SUPPLY — 87 items
ADH SKN CLS APL DERMABOND .7 (GAUZE/BANDAGES/DRESSINGS) ×1
ALCOHOL 70% 16 OZ (MISCELLANEOUS) ×1 IMPLANT
BAG COUNTER SPONGE SURGICOUNT (BAG) IMPLANT
BAG DECANTER FOR FLEXI CONT (MISCELLANEOUS) ×1 IMPLANT
BAG SPNG CNTER NS LX DISP (BAG)
BANDAGE ESMARK 6X9 LF (GAUZE/BANDAGES/DRESSINGS) IMPLANT
BIT DRILL QUICK REL 1/8 2PK SL (BIT) IMPLANT
BLADE SAG 18X100X1.27 (BLADE) ×1 IMPLANT
BLADE SAW SGTL 73X25 THK (BLADE) ×1 IMPLANT
BNDG CMPR 9X6 STRL LF SNTH (GAUZE/BANDAGES/DRESSINGS)
BNDG ESMARK 6X9 LF (GAUZE/BANDAGES/DRESSINGS)
BOWL SMART MIX CTS (DISPOSABLE) ×1 IMPLANT
CLSR STERI-STRIP ANTIMIC 1/2X4 (GAUZE/BANDAGES/DRESSINGS) ×2 IMPLANT
COMP FEM STD PS 8 RT (Joint) ×1 IMPLANT
COMP PATELLA 3 PEG 35 (Joint) ×1 IMPLANT
COMP TIB PS KNEE 0D E RT (Joint) ×1 IMPLANT
COMPONENT FEM STD PS 8 RT (Joint) IMPLANT
COMPONENT PATELLA 3 PEG 35 (Joint) IMPLANT
COMPONET TIB PS KNEE 0D E RT (Joint) IMPLANT
COOLER ICEMAN CLASSIC (MISCELLANEOUS) ×1 IMPLANT
COVER SURGICAL LIGHT HANDLE (MISCELLANEOUS) ×1 IMPLANT
CUFF TOURN SGL QUICK 34 (TOURNIQUET CUFF) ×1
CUFF TOURN SGL QUICK 42 (TOURNIQUET CUFF) IMPLANT
CUFF TRNQT CYL 34X4.125X (TOURNIQUET CUFF) ×1 IMPLANT
DERMABOND ADVANCED .7 DNX12 (GAUZE/BANDAGES/DRESSINGS) ×1 IMPLANT
DRAPE EXTREMITY T 121X128X90 (DISPOSABLE) ×1 IMPLANT
DRAPE HALF SHEET 40X57 (DRAPES) ×1 IMPLANT
DRAPE INCISE IOBAN 66X45 STRL (DRAPES) ×1 IMPLANT
DRAPE ORTHO SPLIT 77X108 STRL (DRAPES)
DRAPE POUCH INSTRU U-SHP 10X18 (DRAPES) ×1 IMPLANT
DRAPE SURG ORHT 6 SPLT 77X108 (DRAPES) IMPLANT
DRAPE U-SHAPE 47X51 STRL (DRAPES) ×2 IMPLANT
DRSG AQUACEL AG ADV 3.5X10 (GAUZE/BANDAGES/DRESSINGS) ×1 IMPLANT
DURAPREP 26ML APPLICATOR (WOUND CARE) ×3 IMPLANT
ELECT CAUTERY BLADE 6.4 (BLADE) ×1 IMPLANT
ELECT PENCIL ROCKER SW 15FT (MISCELLANEOUS) ×1 IMPLANT
ELECT REM PT RETURN 9FT ADLT (ELECTROSURGICAL) ×1
ELECTRODE REM PT RTRN 9FT ADLT (ELECTROSURGICAL) ×1 IMPLANT
GLOVE BIOGEL PI IND STRL 7.0 (GLOVE) ×2 IMPLANT
GLOVE BIOGEL PI IND STRL 7.5 (GLOVE) ×5 IMPLANT
GLOVE ECLIPSE 7.0 STRL STRAW (GLOVE) ×3 IMPLANT
GLOVE INDICATOR 7.0 STRL GRN (GLOVE) ×1 IMPLANT
GLOVE INDICATOR 7.5 STRL GRN (GLOVE) ×1 IMPLANT
GLOVE SURG SYN 7.5 E (GLOVE) ×2 IMPLANT
GLOVE SURG SYN 7.5 PF PI (GLOVE) ×2 IMPLANT
GLOVE SURG UNDER LTX SZ7.5 (GLOVE) ×2 IMPLANT
GLOVE SURG UNDER POLY LF SZ7 (GLOVE) ×2 IMPLANT
GOWN STRL REUS W/ TWL LRG LVL3 (GOWN DISPOSABLE) ×1 IMPLANT
GOWN STRL REUS W/TWL LRG LVL3 (GOWN DISPOSABLE) ×1
GOWN STRL SURGICAL XL XLNG (GOWN DISPOSABLE) ×1 IMPLANT
GOWN TOGA ZIPPER T7+ PEEL AWAY (MISCELLANEOUS) ×2 IMPLANT
HANDPIECE INTERPULSE COAX TIP (DISPOSABLE) ×1
HOOD PEEL AWAY T7 (MISCELLANEOUS) ×1 IMPLANT
INSERT TIBIA KNEE RIGHT 10 (Joint) IMPLANT
KIT BASIN OR (CUSTOM PROCEDURE TRAY) ×1 IMPLANT
KIT TURNOVER KIT B (KITS) ×1 IMPLANT
MANIFOLD NEPTUNE II (INSTRUMENTS) ×1 IMPLANT
MARKER SKIN DUAL TIP RULER LAB (MISCELLANEOUS) ×2 IMPLANT
NDL SPNL 18GX3.5 QUINCKE PK (NEEDLE) ×1 IMPLANT
NEEDLE SPNL 18GX3.5 QUINCKE PK (NEEDLE) ×1 IMPLANT
NS IRRIG 1000ML POUR BTL (IV SOLUTION) ×1 IMPLANT
PACK TOTAL JOINT (CUSTOM PROCEDURE TRAY) ×1 IMPLANT
PAD ARMBOARD 7.5X6 YLW CONV (MISCELLANEOUS) ×2 IMPLANT
PAD COLD SHLDR WRAP-ON (PAD) ×1 IMPLANT
PIN DRILL HDLS TROCAR 75 4PK (PIN) IMPLANT
SCREW FEMALE HEX FIX 25X2.5 (ORTHOPEDIC DISPOSABLE SUPPLIES) IMPLANT
SET HNDPC FAN SPRY TIP SCT (DISPOSABLE) ×1 IMPLANT
SOLUTION PRONTOSAN WOUND 350ML (IRRIGATION / IRRIGATOR) ×1 IMPLANT
STAPLER VISISTAT 35W (STAPLE) IMPLANT
SUCTION TUBE FRAZIER 10FR DISP (SUCTIONS) ×1 IMPLANT
SUT ETHILON 2 0 FS 18 (SUTURE) IMPLANT
SUT MNCRL AB 3-0 PS2 27 (SUTURE) IMPLANT
SUT VIC AB 0 CT1 27 (SUTURE) ×1
SUT VIC AB 0 CT1 27XBRD ANBCTR (SUTURE) ×2 IMPLANT
SUT VIC AB 1 CTX 27 (SUTURE) ×3 IMPLANT
SUT VIC AB 2-0 CT1 27 (SUTURE) ×3
SUT VIC AB 2-0 CT1 TAPERPNT 27 (SUTURE) ×4 IMPLANT
SYR 50ML LL SCALE MARK (SYRINGE) ×2 IMPLANT
TOWEL GREEN STERILE (TOWEL DISPOSABLE) ×1 IMPLANT
TOWEL GREEN STERILE FF (TOWEL DISPOSABLE) ×1 IMPLANT
TRAY CATH INTERMITTENT SS 16FR (CATHETERS) IMPLANT
TRAY FOLEY W/BAG SLVR 16FR (SET/KITS/TRAYS/PACK) ×1
TRAY FOLEY W/BAG SLVR 16FR ST (SET/KITS/TRAYS/PACK) IMPLANT
TUBE SUCT ARGYLE STRL (TUBING) ×1 IMPLANT
UNDERPAD 30X36 HEAVY ABSORB (UNDERPADS AND DIAPERS) ×1 IMPLANT
WARMER LAPAROSCOPE (MISCELLANEOUS) IMPLANT
YANKAUER SUCT BULB TIP NO VENT (SUCTIONS) ×2 IMPLANT

## 2023-07-21 NOTE — Evaluation (Signed)
Physical Therapy Evaluation Patient Details Name: Laura Knox MRN: [id number] DOB: May 20, 1966 Today's Date: 07/21/2023  History of Present Illness  57 y.o. female presents to [facility] hospital on 07/21/2023 for elective R TKA. PMH includes HTN, OA, OSA, thyroid disease, L TKA.  Clinical Impression  Pt presents to PT with deficits in functional mobility, gait, balance, strength, power. Pt is limited by RLE pain at this time, yet still able to ambulate for short household distances. PT provides TKA exercise handout and education on the need for knee extension when resting at this time. Pt will benefit from frequent mobilization to improve strength and activity tolerance. PT will follow up for further gait and mobility training.        If plan is discharge home, recommend the following: A little help with bathing/dressing/bathroom;Assistance with cooking/housework;Assist for transportation;Help with stairs or ramp for entrance   Can travel by private vehicle        Equipment Recommendations None recommended by PT  Recommendations for Other Services       Functional Status Assessment Patient has had a recent decline in their functional status and demonstrates the ability to make significant improvements in function in a reasonable and predictable amount of time.     Precautions / Restrictions Precautions Precautions: Fall;Knee Precaution Booklet Issued: Yes (comment) Restrictions Weight Bearing Restrictions: Yes RLE Weight Bearing: Weight bearing as tolerated      Mobility  Bed Mobility Overal bed mobility: Needs Assistance Bed Mobility: Supine to Sit     Supine to sit: Min assist          Transfers Overall transfer level: Needs assistance Equipment used: Rolling walker (2 wheels) Transfers: Sit to/from Stand Sit to Stand: Contact guard assist                Ambulation/Gait Ambulation/Gait assistance: Contact guard assist Gait Distance (Feet): 100 Feet Assistive  device: Rolling walker (2 wheels) Gait Pattern/deviations: Step-to pattern Gait velocity: reduced Gait velocity interpretation: <1.8 ft/sec, indicate of risk for recurrent falls   General Gait Details: slowed step-to gait, reduced step length, reduced stance time on RLE  Stairs            Wheelchair Mobility     Tilt Bed    Modified Rankin (Stroke Patients Only)       Balance Overall balance assessment: Needs assistance Sitting-balance support: No upper extremity supported, Feet supported Sitting balance-Leahy Scale: Good     Standing balance support: Single extremity supported, Reliant on assistive device for balance Standing balance-Leahy Scale: Poor                               Pertinent Vitals/Pain Pain Assessment Pain Assessment: 0-10 Pain Score: 8  Pain Location: R knee Pain Descriptors / Indicators: Aching Pain Intervention(s): Premedicated before session    Home Living Family/patient expects to be discharged to:: Private residence Living Arrangements: Parent;Other relatives Available Help at Discharge: Family;Available 24 hours/day (brother, parents) Type of Home: House Home Access: Level entry       Home Layout: One level Home Equipment: Agricultural consultant (2 wheels)      Prior Function Prior Level of Function : Independent/Modified Independent;Driving                     Extremity/Trunk Assessment   Upper Extremity Assessment Upper Extremity Assessment: Overall WFL for tasks assessed    Lower Extremity Assessment Lower Extremity Assessment: RLE  deficits/detail RLE Deficits / Details: generalized post-op weakness and ROM deficits as expected s/p TKA    Cervical / Trunk Assessment Cervical / Trunk Assessment: Normal  Communication   Communication Communication: No apparent difficulties Cueing Techniques: Verbal cues  Cognition Arousal: Alert Behavior During Therapy: WFL for tasks assessed/performed Overall  Cognitive Status: Within Functional Limits for tasks assessed                                          General Comments General comments (skin integrity, edema, etc.): VSS on RA    Exercises Other Exercises Other Exercises: PT provided pt with TKR exercise packet. Pt received same packet after recent TKA in january, pt denies questions at this time.   Assessment/Plan    PT Assessment Patient needs continued PT services  PT Problem List Decreased strength;Decreased activity tolerance;Decreased range of motion;Decreased balance;Decreased mobility;Decreased knowledge of use of DME;Pain       PT Treatment Interventions DME instruction;Gait training;Functional mobility training;Therapeutic exercise;Therapeutic activities;Neuromuscular re-education;Balance training;Patient/family education    PT Goals (Current goals can be found in the Care Plan section)  Acute Rehab PT Goals Patient Stated Goal: to return to independence PT Goal Formulation: With patient Time For Goal Achievement: 07/25/23 Potential to Achieve Goals: Good    Frequency Min 1X/week     Co-evaluation               AM-PAC PT "6 Clicks" Mobility  Outcome Measure Help needed turning from your back to your side while in a flat bed without using bedrails?: A Little Help needed moving from lying on your back to sitting on the side of a flat bed without using bedrails?: A Little Help needed moving to and from a bed to a chair (including a wheelchair)?: A Little Help needed standing up from a chair using your arms (e.g., wheelchair or bedside chair)?: A Little Help needed to walk in hospital room?: A Little Help needed climbing 3-5 steps with a railing? : Total 6 Click Score: 16    End of Session   Activity Tolerance: Patient limited by pain Patient left: in chair;with call bell/phone within reach;with family/visitor present Nurse Communication: Mobility status PT Visit Diagnosis: Other  abnormalities of gait and mobility (R26.89);Muscle weakness (generalized) (M62.81);Pain Pain - Right/Left: Right Pain - part of body: Knee    Time: 1233-1253 PT Time Calculation (min) (ACUTE ONLY): 20 min   Charges:   PT Evaluation $PT Eval Low Complexity: 1 Low   PT General Charges $$ ACUTE PT VISIT: 1 Visit         Arlyss Gandy, PT, DPT Acute Rehabilitation Office [phone]   Arlyss Gandy 07/21/2023, 1:11 PM

## 2023-07-21 NOTE — Transfer of Care (Signed)
Immediate Anesthesia Transfer of Care Note  Patient: Laura Knox  Procedure(s) Performed: RIGHT TOTAL KNEE REPLACEMENT (Right: Knee)  Patient Location: PACU  Anesthesia Type:MAC  Level of Consciousness: awake, alert , and oriented  Airway & Oxygen Therapy: Patient Spontanous Breathing and Patient connected to face mask oxygen  Post-op Assessment: Report given to RN and Post -op Vital signs reviewed and stable  Post vital signs: Reviewed and stable  Last Vitals:  Vitals Value Taken Time  BP 107/68   Temp 97.4   Pulse 76   Resp 14   SpO2 96 %     Last Pain:  Vitals:   07/21/23 0615  TempSrc:   PainSc: 0-No pain         Complications: No notable events documented.

## 2023-07-21 NOTE — Anesthesia Procedure Notes (Addendum)
Anesthesia Regional Block: Adductor canal block   Pre-Anesthetic Checklist: , timeout performed,  Correct Patient, Correct Site, Correct Laterality,  Correct Procedure, Correct Position, site marked,  Risks and benefits discussed,  Surgical consent,  Pre-op evaluation,  At surgeon's request and post-op pain management  Laterality: Lower and Right  Prep: chloraprep       Needles:  Injection technique: Single-shot  Needle Type: Echogenic Needle     Needle Length: 9cm  Needle Gauge: 22     Additional Needles:   Procedures:,,,, ultrasound used (permanent image in chart),,    Narrative:  Start time: 07/21/2023 6:56 AM End time: 07/21/2023 7:03 AM Injection made incrementally with aspirations every 5 mL.  Performed by: Personally  Anesthesiologist: Trevor Iha, MD  Additional Notes: Block assessed prior to surgery. Pt tolerated procedure well.

## 2023-07-21 NOTE — Anesthesia Postprocedure Evaluation (Signed)
Anesthesia Post Note  Patient: Laura Knox  Procedure(s) Performed: RIGHT TOTAL KNEE REPLACEMENT (Right: Knee)     Patient location during evaluation: Nursing Unit Anesthesia Type: Regional and Spinal Level of consciousness: oriented and awake and alert Pain management: pain level controlled Vital Signs Assessment: post-procedure vital signs reviewed and stable Respiratory status: spontaneous breathing and respiratory function stable Cardiovascular status: blood pressure returned to baseline and stable Postop Assessment: no headache, no backache, no apparent nausea or vomiting and patient able to bend at knees Anesthetic complications: no   No notable events documented.  Last Vitals:  Vitals:   07/21/23 1030 07/21/23 1049  BP: 110/86 127/82  Pulse: 64 (!) 55  Resp: 16 20  Temp: (!) 36.2 C 36.7 C  SpO2: 94% 99%    Last Pain:  Vitals:   07/21/23 1140  TempSrc:   PainSc: 6                  Trevor Iha

## 2023-07-21 NOTE — Anesthesia Procedure Notes (Signed)
Spinal  Patient location during procedure: OR Start time: 07/21/2023 7:25 AM End time: 07/21/2023 7:29 AM Reason for block: surgical anesthesia Staffing Performed: anesthesiologist  Anesthesiologist: Trevor Iha, MD Performed by: Trevor Iha, MD Authorized by: Trevor Iha, MD   Preanesthetic Checklist Completed: patient identified, IV checked, risks and benefits discussed, surgical consent, monitors and equipment checked, pre-op evaluation and timeout performed Spinal Block Patient position: sitting Prep: DuraPrep and site prepped and draped Patient monitoring: heart rate, cardiac monitor, continuous pulse ox and blood pressure Approach: midline Location: L3-4 Injection technique: single-shot Needle Needle type: Pencan  Needle gauge: 24 G Needle length: 10 cm Needle insertion depth: 7 cm Assessment Sensory level: T4 Events: CSF return Additional Notes  1 Attempt (s). Pt tolerated procedure well.

## 2023-07-21 NOTE — H&P (Signed)
PREOPERATIVE H&P  Chief Complaint: right knee osteoarthritis  HPI: Laura Knox is a 57 y.o. female who presents for surgical treatment of right knee osteoarthritis.  She denies any changes in medical history.  Past Surgical History:  Procedure Laterality Date   COLONOSCOPY WITH PROPOFOL N/A 04/10/2021   Procedure: COLONOSCOPY WITH PROPOFOL;  Surgeon: Lynann Bologna, MD;  Location: [facility];  Service: Endoscopy;  Laterality: N/A;   JOINT REPLACEMENT     KIDNEY STONE SURGERY     miniscus repair Left    POLYPECTOMY  04/10/2021   Procedure: POLYPECTOMY;  Surgeon: Lynann Bologna, MD;  Location: [facility];  Service: Endoscopy;;   SEPTOPLASTY     TOTAL KNEE ARTHROPLASTY Left 11/22/2022   Procedure: LEFT TOTAL KNEE REPLACEMENT;  Surgeon: Tarry Kos, MD;  Location: [facility] OR;  Service: Orthopedics;  Laterality: Left;   Social History   Socioeconomic History   Marital status: Married    Spouse name: Greig Castilla   Number of children: 0   Years of education: Not on file   Highest education level: Not on file  Occupational History   Occupation: Retired  Tobacco Use   Smoking status: Never   Smokeless tobacco: Never  Vaping Use   Vaping status: Never Used  Substance and Sexual Activity   Alcohol use: Not Currently   Drug use: Never   Sexual activity: Yes    Partners: Male  Other Topics Concern   Not on file  Social History Narrative   Not on file   Social Determinants of Health   Financial Resource Strain: Low Risk  (03/17/2022)   Received from [facility], [facility]   Overall Financial Resource Strain (CARDIA)    Difficulty of Paying Living Expenses: Not hard at all  Food Insecurity: No Food Insecurity (03/17/2022)   Received from [facility], [facility]   Hunger Vital Sign    Worried About Running Out of Food in the Last Year: Never true    Ran Out of Food in the Last Year: Never true  Transportation Needs: Not on file  Physical Activity: Sufficiently Active  (03/17/2022)   Received from [facility], [facility]   Exercise Vital Sign    Days of Exercise per Week: 5 days    Minutes of Exercise per Session: 60 min  Stress: No Stress Concern Present (03/17/2022)   Received from [facility], [facility]: [facility] - Occupational Stress Questionnaire    Feeling of Stress : Not at all  Social Connections: Unknown (03/25/2023)   Received from [facility], [facility]   Social Network    Social Network: Not on file   Family History  Problem Relation Age of Onset   Healthy Mother    Obesity Mother    Cancer Father    Obesity Father    Cancer - Other Father    Stroke Maternal Grandmother    Lung cancer Maternal Grandfather    Allergies  Allergen Reactions   Chlorthalidone Other (See Comments)    Bottomed out potassium    Oxycodone     Unmanageable constipation    Prior to Admission medications   Medication Sig Start Date End Date Taking? Authorizing Provider  ARMOUR THYROID 120 MG tablet TAKE 1 TABLET (120 MG TOTAL) BY MOUTH DAILY BEFORE BREAKFAST. 06/09/23  Yes Saguier, Ramon Dredge, PA-C  Ascorbic Acid (VITAMIN C) 1000 MG tablet Take 1,000 mg by mouth daily.   Yes [provider]  cetirizine (ZYRTEC)  10 MG tablet Take 10 mg by mouth daily.   Yes [provider]  colchicine 0.6 MG tablet Take 1 tablet (0.6 mg total) by mouth daily as needed (gout or psuedogout pain). 04/15/23  Yes Rodolph Bong, MD  losartan (COZAAR) 100 MG tablet TAKE 1 TABLET BY MOUTH EVERYDAY AT BEDTIME 03/25/23  Yes Saguier, Ramon Dredge, PA-C  Multiple Vitamins-Minerals (HAIR/SKIN/NAILS/BIOTIN PO) Take 2 tablets by mouth daily.   Yes [provider]  Multiple Vitamins-Minerals (MULTIVITAMIN ADULT) CHEW Chew 2 each by mouth daily.   Yes [provider]  Omega-3 Fatty Acids (FISH OIL) 1000 MG CAPS Take 1,000 mg by mouth 2 (two) times daily.   Yes [provider]  progesterone (PROMETRIUM) 100 MG  capsule Take 1 capsule (100 mg total) by mouth at bedtime. 04/18/23  Yes Milas Hock, MD  traMADol (ULTRAM) 50 MG tablet Take 1 tablet (50 mg total) by mouth daily as needed. Patient taking differently: Take 50 mg by mouth at bedtime as needed for severe pain. 05/15/23  Yes Tarry Kos, MD  amLODipine (NORVASC) 5 MG tablet Take 1 tablet (5 mg total) by mouth daily. 07/02/23   Quillian Quince D, MD  aspirin EC 81 MG tablet Take 1 tablet (81 mg total) by mouth 2 (two) times daily. To be taken after surgery to prevent blood clots 07/15/23 07/14/24  Cristie Hem, PA-C  docusate sodium (COLACE) 100 MG capsule Take 1 capsule (100 mg total) by mouth daily as needed. 07/15/23 07/14/24  Cristie Hem, PA-C  doxycycline (VIBRAMYCIN) 100 MG capsule Take 1 capsule (100 mg total) by mouth 2 (two) times daily. To be taken after surgery 07/15/23   Cristie Hem, PA-C  estradiol (VIVELLE-DOT) 0.1 MG/24HR patch PLACE 1 PATCH (0.1 MG TOTAL) ONTO THE SKIN 2 (TWO) TIMES A WEEK. 07/03/23   Saguier, Ramon Dredge, PA-C  HYDROcodone-acetaminophen (NORCO) 7.5-325 MG tablet Take 1-2 tablets by mouth every 6 (six) hours as needed for moderate pain. To be taken after surgery 07/15/23   Cristie Hem, PA-C  metFORMIN (GLUCOPHAGE) 500 MG tablet 2 po with lunch qd 07/17/23   Quillian Quince D, MD  methocarbamol (ROBAXIN-750) 750 MG tablet Take 1 tablet (750 mg total) by mouth 2 (two) times daily as needed for muscle spasms. 07/15/23   Cristie Hem, PA-C  ondansetron (ZOFRAN) 4 MG tablet Take 1 tablet (4 mg total) by mouth every 8 (eight) hours as needed for nausea or vomiting. 07/15/23   Cristie Hem, PA-C  polyethylene glycol powder (GLYCOLAX/MIRALAX) 17 GM/SCOOP powder Take 1 Container by mouth daily.    [provider]     Positive ROS: All other systems have been reviewed and were otherwise negative with the exception of those mentioned in the HPI and as above.  Physical Exam: General: Alert, no acute  distress Cardiovascular: No pedal edema Respiratory: No cyanosis, no use of accessory musculature GI: abdomen soft Skin: No lesions in the area of chief complaint Neurologic: Sensation intact distally Psychiatric: Patient is competent for consent with normal mood and affect Lymphatic: no lymphedema  MUSCULOSKELETAL: exam stable  Assessment: right knee osteoarthritis  Plan: Plan for Procedure(s): RIGHT TOTAL KNEE ARTHROPLASTY  The risks benefits and alternatives were discussed with the patient including but not limited to the risks of nonoperative treatment, versus surgical intervention including infection, bleeding, nerve injury,  blood clots, cardiopulmonary complications, morbidity, mortality, among others, and they were willing to proceed.   Glee Arvin, MD 07/21/2023 5:55 AM

## 2023-07-21 NOTE — Discharge Instructions (Signed)

## 2023-07-21 NOTE — Evaluation (Signed)
Occupational Therapy Evaluation Patient Details Name: Laura Knox MRN: [id number] DOB: Dec 30, 1965 Today's Date: 07/21/2023   History of Present Illness 57 y.o. female presents to [facility] hospital on 07/21/2023 for elective R TKA. PMH includes HTN, OA, OSA, thyroid disease, L TKA.   Clinical Impression   Patient admitted for the procedure above.  PTA she lives alone, and remains independent with ADL,iADL and mobility.  Currently R knee pain is limiting ADL independence.  Patient is needing assist to place sock and undergarment on R leg.  Patient has a Sports administrator at home and understands how to use it for lower body ADL.  OT suggested Long Handled Sponge.  Patient will be staying with parents, and her brother will be staying there as well, and will provide any assist needed.  Given prior TKA, no significant OT needs exist in the acute setting.  OT will defer to PT for continuation of TKA protocol.  Recommend follow up as prescribed by MD.         If plan is discharge home, recommend the following: Assist for transportation;Assistance with cooking/housework;A little help with bathing/dressing/bathroom;A little help with walking and/or transfers    Functional Status Assessment  Patient has had a recent decline in their functional status and demonstrates the ability to make significant improvements in function in a reasonable and predictable amount of time.  Equipment Recommendations  None recommended by OT    Recommendations for Other Services       Precautions / Restrictions Precautions Precautions: Fall;Knee Precaution Booklet Issued: Yes (comment) Restrictions Weight Bearing Restrictions: Yes RLE Weight Bearing: Weight bearing as tolerated      Mobility Bed Mobility Overal bed mobility: Needs Assistance Bed Mobility: Supine to Sit, Sit to Supine     Supine to sit: Supervision Sit to supine: Supervision        Transfers Overall transfer level: Needs assistance Equipment used:  Rolling walker (2 wheels) Transfers: Sit to/from Stand Sit to Stand: Supervision                  Balance Overall balance assessment: Needs assistance Sitting-balance support: No upper extremity supported, Feet supported Sitting balance-Leahy Scale: Good     Standing balance support: Reliant on assistive device for balance Standing balance-Leahy Scale: Poor                             ADL either performed or assessed with clinical judgement   ADL       Grooming: Wash/dry hands;Supervision/safety;Standing               Lower Body Dressing: Minimal assistance;Sit to/from stand   Toilet Transfer: Supervision/safety;Rolling walker (2 wheels);Ambulation                   Vision Baseline Vision/History: 1 Wears glasses Patient Visual Report: No change from baseline       Perception Perception: Not tested       Praxis Praxis: Not tested       Pertinent Vitals/Pain Pain Assessment Pain Assessment: Faces Faces Pain Scale: Hurts whole lot Pain Location: R knee Pain Descriptors / Indicators: Aching, Grimacing, Guarding, Tightness Pain Intervention(s): Monitored during session, Premedicated before session     Extremity/Trunk Assessment Upper Extremity Assessment Upper Extremity Assessment: Overall WFL for tasks assessed   Lower Extremity Assessment Lower Extremity Assessment: Defer to PT evaluation RLE Deficits / Details: generalized post-op weakness and ROM deficits as expected s/p TKA  Cervical / Trunk Assessment Cervical / Trunk Assessment: Normal   Communication Communication Communication: No apparent difficulties Cueing Techniques: Verbal cues   Cognition Arousal: Alert Behavior During Therapy: WFL for tasks assessed/performed Overall Cognitive Status: Within Functional Limits for tasks assessed                                       General Comments  VSS on RA    Exercises     Shoulder Instructions       Home Living Family/patient expects to be discharged to:: Private residence Living Arrangements: Parent;Other relatives Available Help at Discharge: Family;Available 24 hours/day Type of Home: House Home Access: Level entry     Home Layout: One level     Bathroom Shower/Tub: Producer, television/film/video: Standard Bathroom Accessibility: Yes How Accessible: Accessible via walker Home Equipment: Rolling Walker (2 wheels);BSC/3in1;Shower seat;Adaptive equipment Adaptive Equipment: Reacher        Prior Functioning/Environment Prior Level of Function : Independent/Modified Independent;Driving             Mobility Comments: mobilizing independently, retired from [employer] ADLs Comments: independent with extra time        OT Problem List: Decreased range of motion;Impaired balance (sitting and/or standing);Decreased strength;Pain;Increased edema      OT Treatment/Interventions:      OT Goals(Current goals can be found in the care plan section) Acute Rehab OT Goals Patient Stated Goal: Return home 9/24 OT Goal Formulation: With patient Time For Goal Achievement: 07/28/23 Potential to Achieve Goals: Good  OT Frequency:      Co-evaluation              AM-PAC OT "6 Clicks" Daily Activity     Outcome Measure Help from another person eating meals?: None Help from another person taking care of personal grooming?: A Little Help from another person toileting, which includes using toliet, bedpan, or urinal?: A Little Help from another person bathing (including washing, rinsing, drying)?: A Little Help from another person to put on and taking off regular upper body clothing?: None Help from another person to put on and taking off regular lower body clothing?: A Little 6 Click Score: 20   End of Session Equipment Utilized During Treatment: Rolling walker (2 wheels) Nurse Communication: Mobility status  Activity Tolerance: Patient limited by pain Patient  left: in bed;with call bell/phone within reach;with family/visitor present  OT Visit Diagnosis: Unsteadiness on feet (R26.81);Pain Pain - Right/Left: Right Pain - part of body: Knee                Time: 2841-3244 OT Time Calculation (min): 22 min Charges:  OT General Charges $OT Visit: 1 Visit OT Evaluation $OT Eval Moderate Complexity: 1 Mod  07/21/2023  RP, OTR/L  Acute Rehabilitation Services  Office:  [phone]   Suzanna Obey 07/21/2023, 3:46 PM

## 2023-07-21 NOTE — Op Note (Signed)
Total Knee Arthroplasty Procedure Note  Preoperative diagnosis: Right knee osteoarthritis  Postoperative diagnosis:same  Operative findings: Complete loss of joint space from medial and patellofemoral compartments Widespread osteophytosis Varus deformity  Operative procedure: Right total knee arthroplasty. CPT [phone]  Surgeon: N. Glee Arvin, MD  Assist: Oneal Grout, PA-C; necessary for the timely completion of procedure and due to complexity of procedure.  Anesthesia: Spinal, regional  Tourniquet time: see anesthesia record  Implants used: Zimmer persona press-fit Femur: CR 8 Tibia: E Patella: 35 mm Polyethylene: 10 mm medial congruent  Indication: Laura Knox is a 57 y.o. year old female with a history of knee pain. Having failed conservative management, the patient elected to proceed with a total knee arthroplasty.  We have reviewed the risk and benefits of the surgery and they elected to proceed after voicing understanding.  Procedure:  After informed consent was obtained and understanding of the risk were voiced including but not limited to bleeding, infection, damage to surrounding structures including nerves and vessels, blood clots, leg length inequality and the failure to achieve desired results, the operative extremity was marked with verbal confirmation of the patient in the holding area.   The patient was then brought to the operating room and transported to the operating room table in the supine position.  A tourniquet was applied to the operative extremity around the upper thigh. The operative limb was then prepped and draped in the usual sterile fashion and preoperative antibiotics were administered.  A time out was performed prior to the start of surgery confirming the correct extremity, preoperative antibiotic administration, as well as team members, implants and instruments available for the case. Correct surgical site was also confirmed with  preoperative radiographs. The limb was then elevated for exsanguination and the tourniquet was inflated. A midline incision was made and a standard medial parapatellar approach was performed.  The patella was everted which showed complete loss of articular cartilage.  The patella was prepared and sized to a 35 mm.  A cover was placed on the patella for protection from retractors.  We then turned our attention to the femur. Posterior cruciate ligament was sacrificed. Start site was drilled in the femur and the intramedullary distal femoral cutting guide was placed, set at 3 degrees valgus, taking 10 mm of distal resection. The distal cut was made. Osteophytes were then removed.   Next, the proximal tibial cutting guide was placed with appropriate slope, varus/valgus alignment and depth of resection. A drop rod was attached to confirm that it was pointed to the second metatarsal.  The proximal tibial cut was made taking 2 mm off the low side. Gap blocks were then used to assess the extension gap and alignment, and appropriate soft tissue releases were performed. Attention was turned back to the femur, which was sized using the sizing guide to a size 8. Appropriate rotation of the femoral component was determined using epicondylar axis, Whiteside's line, and assessing the flexion gap under ligament tension. The appropriate size 4-in-1 cutting block was placed and cuts were made.  Posterior femoral osteophytes and uncapped bone were then removed with the curved osteotome.  Trial components were placed, and stability was checked in full extension, mid-flexion, and deep flexion. Proper tibial rotation was determined and marked.  The patella tracked well without a lateral release. Trial components were then removed and tibial preparation performed.  The tibial bone quality was excellent.  The tibia was sized for a size E component.  The bony surfaces  were irrigated with a pulse lavage and then dried. Final components  were placed.  The stability of the construct was re-evaluated throughout a range of motion and found to be acceptable. The trial liner was removed, the knee was copiously irrigated, and the knee was re-evaluated for any excess bone debris. The real polyethylene liner, 10 mm thick, was inserted and checked to ensure the locking mechanism had engaged appropriately. The tourniquet was deflated and hemostasis was achieved. The wound was irrigated with normal saline.  One gram of vancomycin powder was placed in the surgical bed.  Topical 0.25% bupivacaine and meloxicam was placed in the joint for postoperative pain.  Capsular closure was performed with a #1 vicryl, subcutaneous fat closed with a 0 vicryl suture, then subcutaneous tissue closed with interrupted 2.0 vicryl suture. The skin was then closed with a 2.0 nylon and dermabond. A sterile dressing was applied.  The patient was awakened in the operating room and taken to recovery in stable condition. All sponge, needle, and instrument counts were correct at the end of the case.  Tessa Lerner was necessary for opening, closing, retracting, limb positioning and overall facilitation and completion of the surgery.  Position: supine  Complications: none.  Time Out: performed   Drains/Packing: none  Estimated blood loss: minimal  Returned to Recovery Room: in good condition.   Antibiotics: yes   Mechanical VTE (DVT) Prophylaxis: sequential compression devices, TED thigh-high  Chemical VTE (DVT) Prophylaxis: xarelto POD 1  Fluid Replacement  Crystalloid: see anesthesia record Blood: none  FFP: none   Specimens Removed: 1 to pathology   Sponge and Instrument Count Correct? yes   PACU: portable radiograph - knee AP and Lateral   Plan/RTC: Return in 2 weeks for wound check.   Weight Bearing/Load Lower Extremity: full   Implant Name Type Inv. Item Serial No. Manufacturer Lot No. LRB No. Used Action  INSERT TIBIA KNEE RIGHT 10 - [id number]  Joint INSERT TIBIA KNEE RIGHT 10  [employer]IMMER [employer]ON(ORTH,TRAU,BIO,SG) [id number] Right 1 Implanted  COMP FEM STD PS 8 RT - [id number] Joint COMP FEM STD PS 8 RT  [employer]MER [employer]ON(ORTH,TRAU,BIO,SG) [id number] Right 1 Implanted  COMP PATELLA 3 PEG 35 - [id number] Joint COMP PATELLA 3 PEG 35  ZIMMER [employer]ON(ORTH,TRAU,BIO,SG) [id number] Right 1 Implanted  COMP TIB PS KNEE 0D E RT - [id number] Joint COMP TIB PS KNEE 0D E RT  ZIMMER [employer]ON(ORTH,TRAU,BIO,SG) [id number] Right 1 Implanted    N. Glee Arvin, MD [facility] 8:36 AM

## 2023-07-22 ENCOUNTER — Encounter (HOSPITAL_COMMUNITY): Payer: Self-pay | Admitting: Orthopaedic Surgery

## 2023-07-22 ENCOUNTER — Other Ambulatory Visit: Payer: Self-pay | Admitting: Physician Assistant

## 2023-07-22 ENCOUNTER — Encounter: Payer: Self-pay | Admitting: Orthopaedic Surgery

## 2023-07-22 ENCOUNTER — Telehealth: Payer: Self-pay | Admitting: Orthopaedic Surgery

## 2023-07-22 DIAGNOSIS — E039 Hypothyroidism, unspecified: Secondary | ICD-10-CM | POA: Diagnosis not present

## 2023-07-22 DIAGNOSIS — Z7984 Long term (current) use of oral hypoglycemic drugs: Secondary | ICD-10-CM | POA: Diagnosis not present

## 2023-07-22 DIAGNOSIS — Z7982 Long term (current) use of aspirin: Secondary | ICD-10-CM | POA: Diagnosis not present

## 2023-07-22 DIAGNOSIS — Z79899 Other long term (current) drug therapy: Secondary | ICD-10-CM | POA: Diagnosis not present

## 2023-07-22 DIAGNOSIS — M1711 Unilateral primary osteoarthritis, right knee: Secondary | ICD-10-CM | POA: Diagnosis not present

## 2023-07-22 DIAGNOSIS — I10 Essential (primary) hypertension: Secondary | ICD-10-CM | POA: Diagnosis not present

## 2023-07-22 DIAGNOSIS — Z96652 Presence of left artificial knee joint: Secondary | ICD-10-CM | POA: Diagnosis not present

## 2023-07-22 MED ORDER — HYDROMORPHONE HCL 2 MG PO TABS: ORAL_TABLET | ORAL | 0 refills | Status: DC

## 2023-07-22 NOTE — Telephone Encounter (Signed)
Patient called advised her pain medicine is not at the pharmacy. Hydromorphone   The number to contact patient is [phone]

## 2023-07-22 NOTE — Progress Notes (Signed)
Physical Therapy Treatment  Patient Details Name: Laura Knox MRN: [id number] DOB: [birth year] Today's Date: 07/22/2023   History of Present Illness Pt is a 57 y/o female presents to [facility] hospital on 07/21/2023 for elective R TKA. PMH includes HTN, OA, OSA, thyroid disease, L TKA.    PT Comments  Pt progressing towards physical therapy goals. Pt reports 10/10 pain initially during there ex however reports improvement in pain by end of session. Encouraged use of ice pack and mobility to reduce stiffness. Upon arrival, pt resting with knees of bed elevated and LE's bent. Pt  educated on positioning recommendations and to rest with knee extended as much as possible. Pt reports she feels comfortable returning home at this time, states pain control is her only concern. Pt declines afternoon PT session, however feel pt is safe for return home with family support and HHPT to follow up at d/c. Will continue to follow.    If plan is discharge home, recommend the following: A little help with bathing/dressing/bathroom;Assistance with cooking/housework;Assist for transportation;Help with stairs or ramp for entrance   Can travel by private vehicle        Equipment Recommendations  None recommended by PT    Recommendations for Other Services       Precautions / Restrictions Precautions Precautions: Fall;Knee Precaution Booklet Issued: Yes (comment) Precaution Comments: Pt was educated that NO pillow/roll/ice pack should be positioned behind the knee. Roll under the ankle only. Restrictions Weight Bearing Restrictions: Yes RLE Weight Bearing: Weight bearing as tolerated     Mobility  Bed Mobility Overal bed mobility: Modified Independent Bed Mobility: Supine to Sit, Sit to Supine           General bed mobility comments: HOB slightly elevated but no assist required to transition to/from EOB.    Transfers Overall transfer level: Modified independent Equipment used: Rolling walker (2  wheels) Transfers: Sit to/from Stand             General transfer comment: Increased time due to pain but no assist required. Pt demonstrated good hand placement on seated surface for safety.    Ambulation/Gait Ambulation/Gait assistance: Supervision Gait Distance (Feet): 200 Feet Assistive device: Rolling walker (2 wheels) Gait Pattern/deviations: Step-to pattern Gait velocity: Decreased Gait velocity interpretation: 1.31 - 2.62 ft/sec, indicative of limited community ambulator   General Gait Details: Decreased step/stride length with decreased heel strike on the R noted initially. With cues, pt able to improve overall gait pattern. Noted smooth, step through gait pattern and fluid walker movement as gait training progressed.   Stairs             Wheelchair Mobility     Tilt Bed    Modified Rankin (Stroke Patients Only)       Balance Overall balance assessment: Needs assistance Sitting-balance support: No upper extremity supported, Feet supported Sitting balance-Leahy Scale: Good     Standing balance support: Reliant on assistive device for balance Standing balance-Leahy Scale: Poor                              Cognition Arousal: Alert Behavior During Therapy: WFL for tasks assessed/performed Overall Cognitive Status: Within Functional Limits for tasks assessed                                          Exercises  Total Joint Exercises Quad Sets: 10 reps Short Arc Quad: 10 reps Heel Slides: 10 reps Hip ABduction/ADduction: 10 reps Knee Flexion: 10 reps Goniometric ROM: 87 AROM in sitting R knee    General Comments        Pertinent Vitals/Pain Pain Assessment Pain Assessment: 0-10 Pain Score: 10-Worst pain ever Pain Location: R knee Pain Descriptors / Indicators: Aching, Grimacing, Guarding, Tightness Pain Intervention(s): Limited activity within patient's tolerance, Monitored during session, Repositioned     Home Living                          Prior Function            PT Goals (current goals can now be found in the care plan section) Acute Rehab PT Goals Patient Stated Goal: to return to independence, get to outpatient PT ASAP PT Goal Formulation: With patient Time For Goal Achievement: 07/25/23 Potential to Achieve Goals: Good Progress towards PT goals: Progressing toward goals    Frequency    Min 1X/week      PT Plan      Co-evaluation              AM-PAC PT "6 Clicks" Mobility   Outcome Measure  Help needed turning from your back to your side while in a flat bed without using bedrails?: A Little Help needed moving from lying on your back to sitting on the side of a flat bed without using bedrails?: A Little Help needed moving to and from a bed to a chair (including a wheelchair)?: A Little Help needed standing up from a chair using your arms (e.g., wheelchair or bedside chair)?: A Little Help needed to walk in hospital room?: A Little Help needed climbing 3-5 steps with a railing? : A Little 6 Click Score: 18    End of Session Equipment Utilized During Treatment: Gait belt Activity Tolerance: Patient limited by pain Patient left: in chair;with call bell/phone within reach;with family/visitor present Nurse Communication: Mobility status PT Visit Diagnosis: Other abnormalities of gait and mobility (R26.89);Muscle weakness (generalized) (M62.81);Pain Pain - Right/Left: Right Pain - part of body: Knee     Time: 4696-2952 PT Time Calculation (min) (ACUTE ONLY): 40 min  Charges:    $Gait Training: 8-22 mins $Therapeutic Exercise: 8-22 mins $Therapeutic Activity: 8-22 mins PT General Charges $$ ACUTE PT VISIT: 1 Visit                     Conni Slipper, PT, DPT Acute Rehabilitation Services Secure Chat Preferred Office: [phone]    [name] 07/22/2023, 10:37 AM

## 2023-07-22 NOTE — Progress Notes (Signed)
Patient alert and oriented, surgical site clean and dry no sign of infection, pt. Void, ambulate. D/c instructions explain and given, all questions answered.

## 2023-07-22 NOTE — Discharge Summary (Signed)
Patient ID: Laura Knox MRN: [id number] DOB/AGE: 58-13-1967 58 y.o.  Admit date: 07/21/2023 Discharge date: 07/22/2023  Admission Diagnoses:  Principal Problem:   Primary osteoarthritis of right knee Active Problems:   Status post total right knee replacement   Discharge Diagnoses:  Same  Past Medical History:  Diagnosis Date   B12 deficiency    Bilateral swelling of feet and ankles    Complication of anesthesia    woke up feeling sore head to toe.  Did not have any issuses with anesthesia in January 2024.   Family history of adverse reaction to anesthesia    mother - nausea   History of kidney stones    Hypertension    Hypothyroidism    Joint pain    Knee pain    Osteoarthritis    Other fatigue    Pre-diabetes    Prediabetes    Sleep apnea    does not use CPAP   Thyroid disease    Vitamin D deficiency     Surgeries: Procedure(s): RIGHT TOTAL KNEE REPLACEMENT on 07/21/2023   Consultants:   Discharged Condition: Improved  Hospital Course: Annisten Gilmour is an 57 y.o. female who was admitted 07/21/2023 for operative treatment ofPrimary osteoarthritis of right knee. Patient has severe unremitting pain that affects sleep, daily activities, and work/hobbies. After pre-op clearance the patient was taken to the operating room on 07/21/2023 and underwent  Procedure(s): RIGHT TOTAL KNEE REPLACEMENT.    Patient was given perioperative antibiotics:  Anti-infectives (From admission, onward)    Start     Dose/Rate Route Frequency Ordered Stop   07/22/23 1000  doxycycline (VIBRA-TABS) tablet 100 mg       Note to Pharmacy: To be taken after surgery     100 mg Oral 2 times daily 07/21/23 1057     07/21/23 1045  ceFAZolin (ANCEF) IVPB 3g/100 mL premix        3 g 200 mL/hr over 30 Minutes Intravenous Every 6 hours 07/21/23 1032 07/21/23 1745   07/21/23 0820  vancomycin (VANCOCIN) powder  Status:  Discontinued          As needed 07/21/23 0821 07/21/23 0915   07/21/23 0700  ceFAZolin  (ANCEF) IVPB 3g/100 mL premix        3 g 200 mL/hr over 30 Minutes Intravenous On call to O.R. 07/21/23 0553 07/22/23 0559   07/21/23 0605  ceFAZolin (ANCEF) 3-0.9 GM/100ML-% IVPB       Note to Pharmacy: Clydene Laming N: cabinet override      07/21/23 0605 07/21/23 1814        Patient was given sequential compression devices, early ambulation, and chemoprophylaxis to prevent DVT.  Patient benefited maximally from hospital stay and there were no complications.    Recent vital signs: Patient Vitals for the past 24 hrs:  BP Temp Temp src Pulse Resp SpO2  07/22/23 0746 (!) 174/96 98 F (36.7 C) -- 90 20 97 %  07/22/23 0353 (!) 143/51 98.4 F (36.9 C) Oral 73 20 95 %  07/21/23 2256 (!) 141/80 98.4 F (36.9 C) Oral (!) 57 20 97 %  07/21/23 1955 (!) 145/74 98.3 F (36.8 C) Oral 81 20 95 %  07/21/23 1548 138/72 98.5 F (36.9 C) Oral 64 20 98 %  07/21/23 1049 127/82 98 F (36.7 C) -- (!) 55 20 99 %  07/21/23 1030 110/86 (!) 97.2 F (36.2 C) -- 64 16 94 %  07/21/23 1015 116/83 -- -- 65 14 98 %  07/21/23 1000 (!) 125/99 -- -- 60 19 96 %  07/21/23 0950 119/68 -- -- (!) 58 13 99 %  07/21/23 0935 114/68 -- -- (!) 57 14 95 %  07/21/23 0920 (!) 107/58 (!) 97.2 F (36.2 C) -- 60 14 97 %     Recent laboratory studies: No results for input(s): "WBC", "HGB", "HCT", "PLT", "NA", "K", "CL", "CO2", "BUN", "CREATININE", "GLUCOSE", "INR", "CALCIUM" in the last 72 hours.  Invalid input(s): "PT", "2"   Discharge Medications:   Allergies as of 07/22/2023       Reactions   Chlorthalidone Other (See Comments)   Bottomed out potassium         Medication List     STOP taking these medications    aspirin EC 81 MG tablet   colchicine 0.6 MG tablet   Fish Oil 1000 MG Caps   HYDROcodone-acetaminophen 7.5-325 MG tablet Commonly known as: Norco   traMADol 50 MG tablet Commonly known as: ULTRAM       TAKE these medications    amLODipine 5 MG tablet Commonly known as:  NORVASC Take 1 tablet (5 mg total) by mouth daily.   [employer]our Thyroid 120 MG tablet Generic drug: thyroid TAKE 1 TABLET (120 MG TOTAL) BY MOUTH DAILY BEFORE BREAKFAST.   cetirizine 10 MG tablet Commonly known as: ZYRTEC Take 10 mg by mouth daily.   docusate sodium 100 MG capsule Commonly known as: Colace Take 1 capsule (100 mg total) by mouth daily as needed.   doxycycline 100 MG capsule Commonly known as: Vibramycin Take 1 capsule (100 mg total) by mouth 2 (two) times daily. To be taken after surgery   estradiol 0.1 MG/24HR patch Commonly known as: VIVELLE-DOT PLACE 1 PATCH (0.1 MG TOTAL) ONTO THE SKIN 2 (TWO) TIMES A WEEK.   HAIR/SKIN/NAILS/BIOTIN PO Take 2 tablets by mouth daily.   Multivitamin Adult Chew Chew 2 each by mouth daily.   HYDROmorphone 2 MG tablet Commonly known as: Dilaudid Take 1-2 pills q6-8 hours prn pain   losartan 100 MG tablet Commonly known as: COZAAR TAKE 1 TABLET BY MOUTH EVERYDAY AT BEDTIME   metFORMIN 500 MG tablet Commonly known as: GLUCOPHAGE 2 po with lunch qd   methocarbamol 750 MG tablet Commonly known as: Robaxin-750 Take 1 tablet (750 mg total) by mouth 2 (two) times daily as needed for muscle spasms.   ondansetron 4 MG tablet Commonly known as: Zofran Take 1 tablet (4 mg total) by mouth every 8 (eight) hours as needed for nausea or vomiting.   polyethylene glycol powder 17 GM/SCOOP powder Commonly known as: GLYCOLAX/MIRALAX Take 1 Container by mouth daily.   progesterone 100 MG capsule Commonly known as: Prometrium Take 1 capsule (100 mg total) by mouth at bedtime.   rivaroxaban 10 MG Tabs tablet Commonly known as: XARELTO Take 1 tablet (10 mg total) by mouth daily. To be taken after surgery to prevent blood clots   vitamin C 1000 MG tablet Take 1,000 mg by mouth daily.               Durable Medical Equipment  (From admission, onward)           Start     Ordered   07/21/23 1033  DME Walker rolling  Once        Question Answer Comment  Walker: With 5 Inch Wheels   Patient needs a walker to treat with the following condition Status post left partial knee replacement  07/21/23 1032   07/21/23 1033  DME 3 n 1  Once        07/21/23 1032   07/21/23 1033  DME Bedside commode  Once       Question:  Patient needs a bedside commode to treat with the following condition  Answer:  Status post left partial knee replacement   07/21/23 1032            Diagnostic Studies: DG Knee Right Port  Result Date: 07/21/2023 CLINICAL DATA:  Postop right your placement. EXAM: PORTABLE RIGHT KNEE - 1-2 VIEW COMPARISON:  None Available. FINDINGS: Right knee arthroplasty in expected alignment. No periprosthetic lucency or fracture. Recent postsurgical change includes air and edema in the soft tissues and joint space. IMPRESSION: Right knee arthroplasty without immediate postoperative complication. Electronically Signed   By: Narda Rutherford M.D.   On: 07/21/2023 13:02    Disposition: Discharge disposition: 01-Home or Self Care          Follow-up Information     Cristie Hem, PA-C. Schedule an appointment as soon as possible for a visit in 2 week(s).   Specialty: Orthopedic Surgery Contact information: [address] [place] [address] [phone]         [facility] Follow up.   Specialty: Home Health Services Why: The home health agency will contact you for the first home visit. Contact information: [address] [place] [address] [phone]                  Signed: Cristie Hem 07/22/2023, 8:09 AM

## 2023-07-22 NOTE — Progress Notes (Signed)
Subjective: 1 Day Post-Op Procedure(s) (LRB): RIGHT TOTAL KNEE REPLACEMENT (Right) Patient reports pain as severe.  No complaints other than pain  Objective: Vital signs in last 24 hours: Temp:  [97.2 F (36.2 C)-98.5 F (36.9 C)] 98 F (36.7 C) (09/24 0746) Pulse Rate:  [55-90] 90 (09/24 0746) Resp:  [13-20] 20 (09/24 0746) BP: (107-174)/(51-99) 174/96 (09/24 0746) SpO2:  [94 %-99 %] 97 % (09/24 0746)  Intake/Output from previous day: 09/23 0701 - 09/24 0700 In: 1350 [P.O.:1200; IV Piggyback:150] Out: 1215 [Urine:1200; Blood:15] Intake/Output this shift: No intake/output data recorded.  No results for input(s): "HGB" in the last 72 hours. No results for input(s): "WBC", "RBC", "HCT", "PLT" in the last 72 hours. No results for input(s): "NA", "K", "CL", "CO2", "BUN", "CREATININE", "GLUCOSE", "CALCIUM" in the last 72 hours. No results for input(s): "LABPT", "INR" in the last 72 hours.  Neurologically intact Neurovascular intact Sensation intact distally Intact pulses distally Dorsiflexion/Plantar flexion intact Incision: dressing C/D/I No cellulitis present Compartment soft   Assessment/Plan: 1 Day Post-Op Procedure(s) (LRB): RIGHT TOTAL KNEE REPLACEMENT (Right) Advance diet Up with therapy D/C IV fluids Discharge home with home health once cleared by PT and pain is under control WBAT RLE       Cristie Hem 07/22/2023, 8:08 AM

## 2023-07-23 NOTE — Telephone Encounter (Signed)
Called patient

## 2023-07-23 NOTE — Telephone Encounter (Signed)
It is not at the pharmacy because it was printed at the hospital yesterday and was supposed to have been given to her.  Did she not get the hard rx from the nurse?

## 2023-07-24 ENCOUNTER — Encounter: Payer: Self-pay | Admitting: Orthopaedic Surgery

## 2023-07-24 NOTE — Telephone Encounter (Signed)
Do not increase the dilaudid.  If her pain is under control with all of these meds, I recommend going to hospital for pain control

## 2023-07-24 NOTE — Telephone Encounter (Signed)
I spoke to her

## 2023-07-24 NOTE — Telephone Encounter (Signed)
She can increase robaxin to tid prn.  She can add tylenol as well.  Cannot take nsaids as she is on xarelto.

## 2023-07-25 ENCOUNTER — Telehealth: Payer: Self-pay | Admitting: Orthopaedic Surgery

## 2023-07-25 ENCOUNTER — Other Ambulatory Visit: Payer: Self-pay | Admitting: Physician Assistant

## 2023-07-25 ENCOUNTER — Encounter: Payer: Self-pay | Admitting: Orthopaedic Surgery

## 2023-07-25 DIAGNOSIS — Z792 Long term (current) use of antibiotics: Secondary | ICD-10-CM | POA: Diagnosis not present

## 2023-07-25 DIAGNOSIS — E119 Type 2 diabetes mellitus without complications: Secondary | ICD-10-CM | POA: Diagnosis not present

## 2023-07-25 DIAGNOSIS — I10 Essential (primary) hypertension: Secondary | ICD-10-CM | POA: Diagnosis not present

## 2023-07-25 DIAGNOSIS — Z7901 Long term (current) use of anticoagulants: Secondary | ICD-10-CM | POA: Diagnosis not present

## 2023-07-25 DIAGNOSIS — Z602 Problems related to living alone: Secondary | ICD-10-CM | POA: Diagnosis not present

## 2023-07-25 DIAGNOSIS — Z7984 Long term (current) use of oral hypoglycemic drugs: Secondary | ICD-10-CM | POA: Diagnosis not present

## 2023-07-25 DIAGNOSIS — Z471 Aftercare following joint replacement surgery: Secondary | ICD-10-CM | POA: Diagnosis not present

## 2023-07-25 DIAGNOSIS — Z96652 Presence of left artificial knee joint: Secondary | ICD-10-CM | POA: Diagnosis not present

## 2023-07-25 DIAGNOSIS — Z96651 Presence of right artificial knee joint: Secondary | ICD-10-CM | POA: Diagnosis not present

## 2023-07-25 DIAGNOSIS — Z9181 History of falling: Secondary | ICD-10-CM | POA: Diagnosis not present

## 2023-07-25 MED ORDER — HYDROMORPHONE HCL 2 MG PO TABS: ORAL_TABLET | ORAL | 0 refills | Status: DC

## 2023-07-25 NOTE — Telephone Encounter (Signed)
Pt called again about update for her medication to be sent in soon. Please call pt at [phone].

## 2023-07-25 NOTE — Telephone Encounter (Signed)
sent 

## 2023-07-28 DIAGNOSIS — Z7984 Long term (current) use of oral hypoglycemic drugs: Secondary | ICD-10-CM | POA: Diagnosis not present

## 2023-07-28 DIAGNOSIS — E119 Type 2 diabetes mellitus without complications: Secondary | ICD-10-CM | POA: Diagnosis not present

## 2023-07-28 DIAGNOSIS — Z9181 History of falling: Secondary | ICD-10-CM | POA: Diagnosis not present

## 2023-07-28 DIAGNOSIS — Z471 Aftercare following joint replacement surgery: Secondary | ICD-10-CM | POA: Diagnosis not present

## 2023-07-28 DIAGNOSIS — Z96652 Presence of left artificial knee joint: Secondary | ICD-10-CM | POA: Diagnosis not present

## 2023-07-28 DIAGNOSIS — I10 Essential (primary) hypertension: Secondary | ICD-10-CM | POA: Diagnosis not present

## 2023-07-28 DIAGNOSIS — Z96651 Presence of right artificial knee joint: Secondary | ICD-10-CM | POA: Diagnosis not present

## 2023-07-28 DIAGNOSIS — Z792 Long term (current) use of antibiotics: Secondary | ICD-10-CM | POA: Diagnosis not present

## 2023-07-28 DIAGNOSIS — Z7901 Long term (current) use of anticoagulants: Secondary | ICD-10-CM | POA: Diagnosis not present

## 2023-07-28 DIAGNOSIS — Z602 Problems related to living alone: Secondary | ICD-10-CM | POA: Diagnosis not present

## 2023-07-30 DIAGNOSIS — I10 Essential (primary) hypertension: Secondary | ICD-10-CM | POA: Diagnosis not present

## 2023-07-30 DIAGNOSIS — Z7984 Long term (current) use of oral hypoglycemic drugs: Secondary | ICD-10-CM | POA: Diagnosis not present

## 2023-07-30 DIAGNOSIS — Z602 Problems related to living alone: Secondary | ICD-10-CM | POA: Diagnosis not present

## 2023-07-30 DIAGNOSIS — Z792 Long term (current) use of antibiotics: Secondary | ICD-10-CM | POA: Diagnosis not present

## 2023-07-30 DIAGNOSIS — Z9181 History of falling: Secondary | ICD-10-CM | POA: Diagnosis not present

## 2023-07-30 DIAGNOSIS — Z7901 Long term (current) use of anticoagulants: Secondary | ICD-10-CM | POA: Diagnosis not present

## 2023-07-30 DIAGNOSIS — Z96652 Presence of left artificial knee joint: Secondary | ICD-10-CM | POA: Diagnosis not present

## 2023-07-30 DIAGNOSIS — Z471 Aftercare following joint replacement surgery: Secondary | ICD-10-CM | POA: Diagnosis not present

## 2023-07-30 DIAGNOSIS — E119 Type 2 diabetes mellitus without complications: Secondary | ICD-10-CM | POA: Diagnosis not present

## 2023-07-30 DIAGNOSIS — Z96651 Presence of right artificial knee joint: Secondary | ICD-10-CM | POA: Diagnosis not present

## 2023-08-01 DIAGNOSIS — Z7984 Long term (current) use of oral hypoglycemic drugs: Secondary | ICD-10-CM | POA: Diagnosis not present

## 2023-08-01 DIAGNOSIS — Z792 Long term (current) use of antibiotics: Secondary | ICD-10-CM | POA: Diagnosis not present

## 2023-08-01 DIAGNOSIS — Z602 Problems related to living alone: Secondary | ICD-10-CM | POA: Diagnosis not present

## 2023-08-01 DIAGNOSIS — Z7901 Long term (current) use of anticoagulants: Secondary | ICD-10-CM | POA: Diagnosis not present

## 2023-08-01 DIAGNOSIS — E119 Type 2 diabetes mellitus without complications: Secondary | ICD-10-CM | POA: Diagnosis not present

## 2023-08-01 DIAGNOSIS — Z96652 Presence of left artificial knee joint: Secondary | ICD-10-CM | POA: Diagnosis not present

## 2023-08-01 DIAGNOSIS — Z9181 History of falling: Secondary | ICD-10-CM | POA: Diagnosis not present

## 2023-08-01 DIAGNOSIS — Z96651 Presence of right artificial knee joint: Secondary | ICD-10-CM | POA: Diagnosis not present

## 2023-08-01 DIAGNOSIS — I10 Essential (primary) hypertension: Secondary | ICD-10-CM | POA: Diagnosis not present

## 2023-08-01 DIAGNOSIS — Z471 Aftercare following joint replacement surgery: Secondary | ICD-10-CM | POA: Diagnosis not present

## 2023-08-02 ENCOUNTER — Other Ambulatory Visit: Payer: Self-pay | Admitting: Physician Assistant

## 2023-08-03 ENCOUNTER — Encounter: Payer: Self-pay | Admitting: Orthopaedic Surgery

## 2023-08-03 ENCOUNTER — Other Ambulatory Visit (INDEPENDENT_AMBULATORY_CARE_PROVIDER_SITE_OTHER): Payer: Self-pay | Admitting: Family Medicine

## 2023-08-03 DIAGNOSIS — I10 Essential (primary) hypertension: Secondary | ICD-10-CM

## 2023-08-04 ENCOUNTER — Other Ambulatory Visit: Payer: Self-pay | Admitting: Physician Assistant

## 2023-08-04 DIAGNOSIS — Z96652 Presence of left artificial knee joint: Secondary | ICD-10-CM | POA: Diagnosis not present

## 2023-08-04 DIAGNOSIS — Z792 Long term (current) use of antibiotics: Secondary | ICD-10-CM | POA: Diagnosis not present

## 2023-08-04 DIAGNOSIS — Z7984 Long term (current) use of oral hypoglycemic drugs: Secondary | ICD-10-CM | POA: Diagnosis not present

## 2023-08-04 DIAGNOSIS — I10 Essential (primary) hypertension: Secondary | ICD-10-CM | POA: Diagnosis not present

## 2023-08-04 DIAGNOSIS — Z471 Aftercare following joint replacement surgery: Secondary | ICD-10-CM | POA: Diagnosis not present

## 2023-08-04 DIAGNOSIS — Z7901 Long term (current) use of anticoagulants: Secondary | ICD-10-CM | POA: Diagnosis not present

## 2023-08-04 DIAGNOSIS — Z9181 History of falling: Secondary | ICD-10-CM | POA: Diagnosis not present

## 2023-08-04 DIAGNOSIS — E119 Type 2 diabetes mellitus without complications: Secondary | ICD-10-CM | POA: Diagnosis not present

## 2023-08-04 DIAGNOSIS — Z602 Problems related to living alone: Secondary | ICD-10-CM | POA: Diagnosis not present

## 2023-08-04 DIAGNOSIS — Z96651 Presence of right artificial knee joint: Secondary | ICD-10-CM | POA: Diagnosis not present

## 2023-08-04 MED ORDER — METHOCARBAMOL 750 MG PO TABS: 750.0000 mg | ORAL_TABLET | Freq: Two times a day (BID) | ORAL | 2 refills | Status: DC | PRN

## 2023-08-04 MED ORDER — HYDROCODONE-ACETAMINOPHEN 7.5-325 MG PO TABS: 1.0000 | ORAL_TABLET | Freq: Three times a day (TID) | ORAL | 0 refills | Status: DC | PRN

## 2023-08-04 NOTE — Telephone Encounter (Signed)
sent 

## 2023-08-04 NOTE — Telephone Encounter (Signed)
Sent in norco 7.5 since she uses [facility] and they have been out of norco 5

## 2023-08-05 ENCOUNTER — Ambulatory Visit (INDEPENDENT_AMBULATORY_CARE_PROVIDER_SITE_OTHER): Payer: BC Managed Care – PPO | Admitting: Orthopaedic Surgery

## 2023-08-05 DIAGNOSIS — Z96651 Presence of right artificial knee joint: Secondary | ICD-10-CM

## 2023-08-05 NOTE — Progress Notes (Signed)
Post-Op Visit Note   Patient: Laura Knox           Date of Birth: 1966-02-19           MRN: [id number] Visit Date: 08/05/2023 PCP: Esperanza Richters, PA-C   Assessment & Plan:  Chief Complaint:  Chief Complaint  Patient presents with   Right Knee - Routine Post Op    RIGHT TKA (surgery date 07-21-23)   Visit Diagnoses:  1. Status post total right knee replacement     Plan: 2 week TKA follow up plan  Patient presents for 2 week follow up after right total knee replacement.   She has had a bowel movement and she is now on Norco for pain.  She has 1 more week of in-home therapy.  The incision is clean, dry, and intact and healing very well. There is no drainage, erythema, or signs of infection. Motion is progressing nicely.  We have encouraged continued use of TED hose as well as aspirin for DVT prophylaxis, and to continue with physical therapy exercises to work on strength and endurance.   Patient is progressing well. Reminders were given about signs to be aware of including redness, drainage, increased pain, fevers, calf pain, shortness of breath, or any concern should generate a phone call or a return to see [place] immediately. Will plan to follow up at 6 weeks post op for next evaluation with radiographs at that time including 3 view xrays of the operative knee.   Follow-Up Instructions: Return in about 4 weeks (around 09/02/2023) for with lindsey for 6 week postop visit.   Orders:  No orders of the defined types were placed in this encounter.  No orders of the defined types were placed in this encounter.   Imaging: No results found.  PMFS History: Patient Active Problem List   Diagnosis Date Noted   Status post total right knee replacement 07/21/2023   Depression 12/24/2022   BMI 40.0-44.9, adult (HCC) 12/24/2022   Obesity, Beginning BMI 61.01 12/24/2022   Status post total left knee replacement 11/22/2022   Primary osteoarthritis of right knee 11/21/2022   Left  knee pain 10/14/2022   Achilles tendinitis 03/21/2022   Pre-diabetes 02/27/2022   Vitamin D deficiency 02/27/2022   At risk for diabetes mellitus 02/27/2022   Plantar fasciitis of right foot 01/08/2022   Special screening for malignant neoplasms, colon    Rectal polyp    Essential hypertension 01/29/2021   Class 3 severe obesity with serious comorbidity and body mass index (BMI) of 60.0 to 69.9 in adult (HCC) 01/29/2021   Decreased functional activity tolerance 07/24/2020   Cervical spondylosis 07/24/2020   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  Did not have any issuses with anesthesia in January 2024.   Family history of adverse reaction to anesthesia    mother - nausea   History of kidney stones    Hypertension    Hypothyroidism    Joint pain    Knee pain    Osteoarthritis    Other fatigue    Pre-diabetes    Prediabetes    Sleep apnea    does not use CPAP   Thyroid disease    Vitamin D deficiency  Family History  Problem Relation Age of Onset   Healthy Mother    Obesity Mother    Cancer Father    Obesity Father    Cancer - Other Father    Stroke Maternal Grandmother    Lung cancer Maternal Grandfather     Past Surgical History:  Procedure Laterality Date   COLONOSCOPY WITH PROPOFOL N/A 04/10/2021   Procedure: COLONOSCOPY WITH PROPOFOL;  Surgeon: Lynann Bologna, MD;  Location: [facility];  Service: Endoscopy;  Laterality: N/A;   JOINT REPLACEMENT     KIDNEY STONE SURGERY     miniscus repair Left    POLYPECTOMY  04/10/2021   Procedure: POLYPECTOMY;  Surgeon: Lynann Bologna, MD;  Location: [facility];  Service: Endoscopy;;   SEPTOPLASTY     TOTAL KNEE ARTHROPLASTY Left 11/22/2022   Procedure: LEFT TOTAL KNEE REPLACEMENT;  Surgeon: Tarry Kos, MD;  Location: [facility] OR;  Service: Orthopedics;  Laterality: Left;   TOTAL KNEE ARTHROPLASTY Right 07/21/2023   Procedure: RIGHT TOTAL KNEE REPLACEMENT;  Surgeon: Tarry Kos, MD;  Location: [facility] OR;  Service: Orthopedics;  Laterality: Right;   Social History   Occupational History   Occupation: Retired  Tobacco Use   Smoking status: Never   Smokeless tobacco: Never  Vaping Use   Vaping status: Never Used  Substance and Sexual Activity   Alcohol use: Not Currently   Drug use: Never   Sexual activity: Yes    Partners: Male

## 2023-08-06 DIAGNOSIS — I10 Essential (primary) hypertension: Secondary | ICD-10-CM | POA: Diagnosis not present

## 2023-08-06 DIAGNOSIS — Z7984 Long term (current) use of oral hypoglycemic drugs: Secondary | ICD-10-CM | POA: Diagnosis not present

## 2023-08-06 DIAGNOSIS — Z96652 Presence of left artificial knee joint: Secondary | ICD-10-CM | POA: Diagnosis not present

## 2023-08-06 DIAGNOSIS — Z9181 History of falling: Secondary | ICD-10-CM | POA: Diagnosis not present

## 2023-08-06 DIAGNOSIS — Z602 Problems related to living alone: Secondary | ICD-10-CM | POA: Diagnosis not present

## 2023-08-06 DIAGNOSIS — Z471 Aftercare following joint replacement surgery: Secondary | ICD-10-CM | POA: Diagnosis not present

## 2023-08-06 DIAGNOSIS — E119 Type 2 diabetes mellitus without complications: Secondary | ICD-10-CM | POA: Diagnosis not present

## 2023-08-06 DIAGNOSIS — Z7901 Long term (current) use of anticoagulants: Secondary | ICD-10-CM | POA: Diagnosis not present

## 2023-08-06 DIAGNOSIS — Z792 Long term (current) use of antibiotics: Secondary | ICD-10-CM | POA: Diagnosis not present

## 2023-08-06 DIAGNOSIS — Z96651 Presence of right artificial knee joint: Secondary | ICD-10-CM | POA: Diagnosis not present

## 2023-08-08 ENCOUNTER — Other Ambulatory Visit: Payer: Self-pay | Admitting: Physician Assistant

## 2023-08-08 ENCOUNTER — Encounter: Payer: Self-pay | Admitting: Orthopaedic Surgery

## 2023-08-08 DIAGNOSIS — Z96652 Presence of left artificial knee joint: Secondary | ICD-10-CM | POA: Diagnosis not present

## 2023-08-08 DIAGNOSIS — Z602 Problems related to living alone: Secondary | ICD-10-CM | POA: Diagnosis not present

## 2023-08-08 DIAGNOSIS — Z96651 Presence of right artificial knee joint: Secondary | ICD-10-CM | POA: Diagnosis not present

## 2023-08-08 DIAGNOSIS — Z792 Long term (current) use of antibiotics: Secondary | ICD-10-CM | POA: Diagnosis not present

## 2023-08-08 DIAGNOSIS — Z7901 Long term (current) use of anticoagulants: Secondary | ICD-10-CM | POA: Diagnosis not present

## 2023-08-08 DIAGNOSIS — Z7984 Long term (current) use of oral hypoglycemic drugs: Secondary | ICD-10-CM | POA: Diagnosis not present

## 2023-08-08 DIAGNOSIS — I10 Essential (primary) hypertension: Secondary | ICD-10-CM | POA: Diagnosis not present

## 2023-08-08 DIAGNOSIS — E119 Type 2 diabetes mellitus without complications: Secondary | ICD-10-CM | POA: Diagnosis not present

## 2023-08-08 DIAGNOSIS — Z471 Aftercare following joint replacement surgery: Secondary | ICD-10-CM | POA: Diagnosis not present

## 2023-08-08 DIAGNOSIS — Z9181 History of falling: Secondary | ICD-10-CM | POA: Diagnosis not present

## 2023-08-08 NOTE — Telephone Encounter (Signed)
Too early for both.  Both were sent in on 10/7 and should be for 10 days

## 2023-08-11 DIAGNOSIS — M25561 Pain in right knee: Secondary | ICD-10-CM | POA: Diagnosis not present

## 2023-08-11 DIAGNOSIS — Z96651 Presence of right artificial knee joint: Secondary | ICD-10-CM | POA: Diagnosis not present

## 2023-08-12 ENCOUNTER — Encounter (INDEPENDENT_AMBULATORY_CARE_PROVIDER_SITE_OTHER): Payer: Self-pay | Admitting: Family Medicine

## 2023-08-12 ENCOUNTER — Ambulatory Visit (INDEPENDENT_AMBULATORY_CARE_PROVIDER_SITE_OTHER): Payer: Self-pay | Admitting: Family Medicine

## 2023-08-12 VITALS — BP 124/73 | HR 87 | Temp 98.1°F | Ht 68.0 in | Wt 271.0 lb

## 2023-08-12 DIAGNOSIS — E669 Obesity, unspecified: Secondary | ICD-10-CM

## 2023-08-12 DIAGNOSIS — I10 Essential (primary) hypertension: Secondary | ICD-10-CM

## 2023-08-12 DIAGNOSIS — Z6841 Body Mass Index (BMI) 40.0 and over, adult: Secondary | ICD-10-CM

## 2023-08-12 DIAGNOSIS — K5909 Other constipation: Secondary | ICD-10-CM | POA: Insufficient documentation

## 2023-08-12 MED ORDER — AMLODIPINE BESYLATE 5 MG PO TABS
5.0000 mg | ORAL_TABLET | Freq: Every day | ORAL | 0 refills | Status: DC
Start: 2023-08-12 — End: 2023-09-09

## 2023-08-12 NOTE — Progress Notes (Signed)
Chief Complaint:   OBESITY Laura Knox is here to discuss her progress with her obesity treatment plan along with follow-up of her obesity related diagnoses. Laura Knox is on the [employer] and states she is following her eating plan approximately 70% of the time. Laura Knox states she is doing physical therapy.   Today's visit was #: 43 Starting weight: 378 lbs Starting date: 12/27/2020 Today's weight: 271 lbs Today's date: 08/12/2023 Total lbs lost to date: 107 Total lbs lost since last in-office visit: 4  Interim History: Patient continues to do well with her weight loss.  She had knee surgery recently and she is recovering, and she is doing well although her pain has been worse this time.  She continues to follow her plan, but she notes sweet cravings.  Subjective:   1. Other constipation Patient has taking Colace and MiraLAX, and constipation has been controlled even while still on opioids.  2. Essential hypertension Patient's blood pressure is well-controlled, with no side effects of amlodipine.  Assessment/Plan:   1. Other constipation Patient will continue Colace and MiraLAX as needed, and she is to make sure to keep hydrating.  2. Essential hypertension We will refill amlodipine 5 mg once daily for 1 month.  Patient will continue with her diet and weight loss, may be able to decrease or discontinue with continued weight loss and decreased pain.  - amLODipine (NORVASC) 5 MG tablet; Take 1 tablet (5 mg total) by mouth daily.  Dispense: 30 tablet; Refill: 0  3. BMI 40.0-44.9, adult (HCC)  4. Obesity, Beginning BMI 61.01 Laura Knox is currently in the action stage of change. As such, her goal is to continue with weight loss efforts. She has agreed to the [employer].   Exercise goals: As is.   Behavioral modification strategies: increasing water intake and increasing high fiber foods.  Laura Knox has agreed to follow-up with our clinic in 4 weeks. She was informed of the  importance of frequent follow-up visits to maximize her success with intensive lifestyle modifications for her multiple health conditions.   Objective:   Blood pressure 124/73, pulse 87, temperature 98.1 F (36.7 C), height 5\' 8"  (1.727 m), weight 271 lb (122.9 kg), SpO2 94%. Body mass index is 41.21 kg/m.  Lab Results  Component Value Date   CREATININE 0.73 07/10/2023   BUN 13 07/10/2023   NA 141 07/10/2023   K 3.6 07/10/2023   CL 104 07/10/2023   CO2 28 07/10/2023   Lab Results  Component Value Date   ALT 15 02/04/2023   AST 13 02/04/2023   ALKPHOS 93 02/04/2023   BILITOT 0.4 02/04/2023   Lab Results  Component Value Date   HGBA1C 5.6 07/10/2023   HGBA1C 5.7 (H) 04/02/2023   HGBA1C 5.5 02/04/2023   HGBA1C 5.8 (H) 08/12/2022   HGBA1C 5.6 02/06/2022   Lab Results  Component Value Date   INSULIN 7.4 08/12/2022   INSULIN 16.7 06/18/2021   INSULIN 20.4 12/27/2020   Lab Results  Component Value Date   TSH 0.22 (L) 02/04/2023   Lab Results  Component Value Date   CHOL 120 02/04/2023   HDL 44.60 02/04/2023   LDLCALC 65 02/04/2023   TRIG 53.0 02/04/2023   CHOLHDL 3 02/04/2023   Lab Results  Component Value Date   VD25OH 41.3 08/12/2022   VD25OH 46.7 02/06/2022   VD25OH 76.3 06/18/2021   Lab Results  Component Value Date   WBC 9.8 07/10/2023   HGB 14.1 07/10/2023   HCT  44.0 07/10/2023   MCV 87.8 07/10/2023   PLT 378 07/10/2023   Lab Results  Component Value Date   FERRITIN 88 02/06/2021   Attestation Statements:   Reviewed by clinician on day of visit: allergies, medications, problem list, medical history, surgical history, family history, social history, and previous encounter notes.   I, Burt Knack, am acting as transcriptionist for Quillian Quince, MD.  I have reviewed the above documentation for accuracy and completeness, and I agree with the above. -  Quillian Quince, MD

## 2023-08-13 DIAGNOSIS — M25561 Pain in right knee: Secondary | ICD-10-CM | POA: Diagnosis not present

## 2023-08-13 DIAGNOSIS — Z96651 Presence of right artificial knee joint: Secondary | ICD-10-CM | POA: Diagnosis not present

## 2023-08-15 DIAGNOSIS — M25561 Pain in right knee: Secondary | ICD-10-CM | POA: Diagnosis not present

## 2023-08-15 DIAGNOSIS — Z96651 Presence of right artificial knee joint: Secondary | ICD-10-CM | POA: Diagnosis not present

## 2023-08-18 DIAGNOSIS — M25561 Pain in right knee: Secondary | ICD-10-CM | POA: Diagnosis not present

## 2023-08-18 DIAGNOSIS — Z96651 Presence of right artificial knee joint: Secondary | ICD-10-CM | POA: Diagnosis not present

## 2023-08-20 DIAGNOSIS — M25561 Pain in right knee: Secondary | ICD-10-CM | POA: Diagnosis not present

## 2023-08-20 DIAGNOSIS — Z96651 Presence of right artificial knee joint: Secondary | ICD-10-CM | POA: Diagnosis not present

## 2023-08-21 ENCOUNTER — Encounter: Payer: Self-pay | Admitting: Orthopaedic Surgery

## 2023-08-22 ENCOUNTER — Other Ambulatory Visit: Payer: Self-pay | Admitting: Physician Assistant

## 2023-08-22 MED ORDER — HYDROCODONE-ACETAMINOPHEN 7.5-325 MG PO TABS: 1.0000 | ORAL_TABLET | Freq: Two times a day (BID) | ORAL | 0 refills | Status: DC | PRN

## 2023-08-22 NOTE — Telephone Encounter (Signed)
sent 

## 2023-08-25 DIAGNOSIS — M25561 Pain in right knee: Secondary | ICD-10-CM | POA: Diagnosis not present

## 2023-08-25 DIAGNOSIS — Z96651 Presence of right artificial knee joint: Secondary | ICD-10-CM | POA: Diagnosis not present

## 2023-08-27 ENCOUNTER — Other Ambulatory Visit: Payer: Self-pay | Admitting: Medical

## 2023-08-27 DIAGNOSIS — M25561 Pain in right knee: Secondary | ICD-10-CM | POA: Diagnosis not present

## 2023-08-27 DIAGNOSIS — Z96651 Presence of right artificial knee joint: Secondary | ICD-10-CM | POA: Diagnosis not present

## 2023-09-01 DIAGNOSIS — M25561 Pain in right knee: Secondary | ICD-10-CM | POA: Diagnosis not present

## 2023-09-01 DIAGNOSIS — Z96651 Presence of right artificial knee joint: Secondary | ICD-10-CM | POA: Diagnosis not present

## 2023-09-02 ENCOUNTER — Ambulatory Visit (INDEPENDENT_AMBULATORY_CARE_PROVIDER_SITE_OTHER): Payer: BC Managed Care – PPO | Admitting: Physician Assistant

## 2023-09-02 ENCOUNTER — Other Ambulatory Visit: Payer: Self-pay

## 2023-09-02 DIAGNOSIS — Z96651 Presence of right artificial knee joint: Secondary | ICD-10-CM

## 2023-09-02 MED ORDER — TRAMADOL HCL 50 MG PO TABS
50.0000 mg | ORAL_TABLET | Freq: Three times a day (TID) | ORAL | 4 refills | Status: DC | PRN
Start: 2023-09-02 — End: 2024-07-22

## 2023-09-02 MED ORDER — METHOCARBAMOL 750 MG PO TABS: 750.0000 mg | ORAL_TABLET | Freq: Two times a day (BID) | ORAL | 3 refills | Status: DC | PRN

## 2023-09-02 NOTE — Progress Notes (Signed)
Post-Op Visit Note   Patient: Laura Knox           Date of Birth: 1966/10/09           MRN: [id number] Visit Date: 09/02/2023 PCP: Esperanza Richters, PA-C   Assessment & Plan:  Chief Complaint:  Chief Complaint  Patient presents with   Right Knee - Follow-up    07/21/23 RT TKA   Visit Diagnoses:  1. Status post total right knee replacement     Plan: Patient is a pleasant 57 year old female who comes in today nearly 6 weeks status post right total knee replacement 07/21/2023.  She has been doing okay.  She is cannot remember if she has been taking her Xarelto/aspirin.  She has been in physical therapy pushing things.  Unfortunately, she just found out she only has 2-3 PT sessions left for the year.  He would like to start water aerobics.  Examination of the right knee reveals a fully healed surgical scar without complication.  Range of motion 5 to 90 degrees.  She is stable to valgus varus stress.  She is neurovascularly intact distally.  At this point, DVT prophylaxis is no longer recommended.  She will continue to push things at home as well as in physical therapy.  We have discussed that we recommend 12 weeks postop before getting in a body of water.  Dental prophylaxis reinforced.  I have sent in tramadol as well as Robaxin.  Follow-up in 4 weeks for recheck of the right knee.  Will then get her on the same schedule to follow her left total knee replacement.  Call with concerns or questions.  Follow-Up Instructions: Return in about 4 weeks (around 09/30/2023).   Orders:  Orders Placed This Encounter  Procedures   XR KNEE 3 VIEW RIGHT   Meds ordered this encounter  Medications   traMADol (ULTRAM) 50 MG tablet    Sig: Take 1 tablet (50 mg total) by mouth 3 (three) times daily as needed.    Dispense:  30 tablet    Refill:  4   methocarbamol (ROBAXIN-750) 750 MG tablet    Sig: Take 1 tablet (750 mg total) by mouth 2 (two) times daily as needed for muscle spasms.    Dispense:   20 tablet    Refill:  3    Imaging: No results found.  PMFS History: Patient Active Problem List   Diagnosis Date Noted   Other constipation 08/12/2023   Status post total right knee replacement 07/21/2023   Depression 12/24/2022   BMI 40.0-44.9, adult (HCC) 12/24/2022   Obesity, Beginning BMI 61.01 12/24/2022   Status post total left knee replacement 11/22/2022   Primary osteoarthritis of right knee 11/21/2022   Left knee pain 10/14/2022   Achilles tendinitis 03/21/2022   Pre-diabetes 02/27/2022   Vitamin D deficiency 02/27/2022   At risk for diabetes mellitus 02/27/2022   Plantar fasciitis of right foot 01/08/2022   Special screening for malignant neoplasms, colon    Rectal polyp    Essential hypertension 01/29/2021   Class 3 severe obesity with serious comorbidity and body mass index (BMI) of 60.0 to 69.9 in adult [facility]) 01/29/2021   Decreased functional activity tolerance 07/24/2020   Cervical spondylosis 07/24/2020   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   Obstructive sleep apnea syndrome 07/20/2015   Arthritis 01/06/2014   Past Medical History:  Diagnosis Date   B12 deficiency    Bilateral swelling of feet and ankles    Complication of anesthesia  woke up feeling sore head to toe.  Did not have any issuses with anesthesia in January 2024.   Family history of adverse reaction to anesthesia    mother - nausea   History of kidney stones    Hypertension    Hypothyroidism    Joint pain    Knee pain    Osteoarthritis    Other fatigue    Pre-diabetes    Prediabetes    Sleep apnea    does not use CPAP   Thyroid disease    Vitamin D deficiency     Family History  Problem Relation Age of Onset   Healthy Mother    Obesity Mother    Cancer Father    Obesity Father    Cancer - Other Father    Stroke Maternal Grandmother    Lung cancer Maternal Grandfather     Past Surgical History:  Procedure Laterality Date   COLONOSCOPY WITH PROPOFOL N/A 04/10/2021   Procedure:  COLONOSCOPY WITH PROPOFOL;  Surgeon: Lynann Bologna, MD;  Location: [facility];  Service: Endoscopy;  Laterality: N/A;   JOINT REPLACEMENT     KIDNEY STONE SURGERY     miniscus repair Left    POLYPECTOMY  04/10/2021   Procedure: POLYPECTOMY;  Surgeon: Lynann Bologna, MD;  Location: [facility];  Service: Endoscopy;;   SEPTOPLASTY     TOTAL KNEE ARTHROPLASTY Left 11/22/2022   Procedure: LEFT TOTAL KNEE REPLACEMENT;  Surgeon: Tarry Kos, MD;  Location: [facility];  Service: Orthopedics;  Laterality: Left;   TOTAL KNEE ARTHROPLASTY Right 07/21/2023   Procedure: RIGHT TOTAL KNEE REPLACEMENT;  Surgeon: Tarry Kos, MD;  Location: [facility];  Service: Orthopedics;  Laterality: Right;   Social History   Occupational History   Occupation: Retired  Tobacco Use   Smoking status: Never   Smokeless tobacco: Never  Vaping Use   Vaping status: Never Used  Substance and Sexual Activity   Alcohol use: Not Currently   Drug use: Never   Sexual activity: Yes    Partners: Male

## 2023-09-09 ENCOUNTER — Ambulatory Visit (INDEPENDENT_AMBULATORY_CARE_PROVIDER_SITE_OTHER): Payer: BC Managed Care – PPO | Admitting: Family Medicine

## 2023-09-09 ENCOUNTER — Other Ambulatory Visit (INDEPENDENT_AMBULATORY_CARE_PROVIDER_SITE_OTHER): Payer: Self-pay | Admitting: Family Medicine

## 2023-09-09 ENCOUNTER — Encounter (INDEPENDENT_AMBULATORY_CARE_PROVIDER_SITE_OTHER): Payer: Self-pay | Admitting: Family Medicine

## 2023-09-09 VITALS — BP 134/81 | HR 75 | Temp 98.0°F | Ht 68.0 in | Wt 269.0 lb

## 2023-09-09 DIAGNOSIS — I10 Essential (primary) hypertension: Secondary | ICD-10-CM | POA: Diagnosis not present

## 2023-09-09 DIAGNOSIS — E669 Obesity, unspecified: Secondary | ICD-10-CM

## 2023-09-09 DIAGNOSIS — Z96651 Presence of right artificial knee joint: Secondary | ICD-10-CM | POA: Diagnosis not present

## 2023-09-09 DIAGNOSIS — Z6841 Body Mass Index (BMI) 40.0 and over, adult: Secondary | ICD-10-CM

## 2023-09-09 DIAGNOSIS — M25561 Pain in right knee: Secondary | ICD-10-CM | POA: Diagnosis not present

## 2023-09-09 MED ORDER — PHENTERMINE HCL 15 MG PO CAPS: 15.0000 mg | ORAL_CAPSULE | ORAL | 0 refills | Status: DC

## 2023-09-09 MED ORDER — AMLODIPINE BESYLATE 5 MG PO TABS: 5.0000 mg | ORAL_TABLET | Freq: Every day | ORAL | 0 refills | Status: DC

## 2023-09-09 NOTE — Progress Notes (Signed)
.smr  Office: [phone]  /  Fax: [phone]  WEIGHT SUMMARY AND BIOMETRICS  Anthropometric Measurements Height: 5\' 8"  (1.727 m) Weight: 269 lb (122 kg) BMI (Calculated): 40.91 Weight at Last Visit: 271 lb Weight Lost Since Last Visit: 2 lb Weight Gained Since Last Visit: 0 Starting Weight: 378 lb Total Weight Loss (lbs): 109 lb (49.4 kg)   Body Composition  Body Fat %: 53.6 % Fat Mass (lbs): 144.6 lbs Muscle Mass (lbs): 118.8 lbs Visceral Fat Rating : 17   Other Clinical Data Fasting: No Labs: No Today's Visit #: 68 Starting Date: 12/27/20    Chief Complaint: OBESITY   History of Present Illness   The patient, with a history of hypertension and obesity, presents for a routine follow-up. She reports a weight loss of two pounds over the past month, attributing this to adherence to a pescetarian diet about 75% of the time and engaging in water exercises for 60 minutes daily. Her blood pressure at this visit is 134/81. She is currently on amlodipine and phentermine, requesting refills for both medications.  The patient also reports joint stiffness, but overall feels well. She has been managing her discomfort with specific footwear designed for a wide toe box. She expresses a struggle with sugar cravings, which she believes to be her biggest challenge in managing her weight. She has been implementing a fasting strategy, typically eating between 8 am to noon or 6 pm to 10 pm, but often extending the fasting period to more than 18 hours. She supplements her diet with protein shakes and has been trying to keep protein snacks on hand.  The patient has recently resumed gym activities, alternating between water exercises and machine workouts. She reports some discomfort during workouts, but is able to distinguish between tolerable discomfort and pain that signals potential harm. She is working closely with a physical therapist to ensure she does not overexert herself during her  workouts.          PHYSICAL EXAM:  Blood pressure 134/81, pulse 75, temperature 98 F (36.7 C), height 5\' 8"  (1.727 m), weight 269 lb (122 kg), SpO2 95%. Body mass index is 40.9 kg/m.  DIAGNOSTIC DATA REVIEWED:  BMET    Component Value Date/Time   NA 141 07/10/2023 1524   NA 142 08/12/2022 1255   K 3.6 07/10/2023 1524   CL 104 07/10/2023 1524   CO2 28 07/10/2023 1524   GLUCOSE 98 07/10/2023 1524   BUN 13 07/10/2023 1524   BUN 17 08/12/2022 1255   CREATININE 0.73 07/10/2023 1524   CALCIUM 8.8 (L) 07/10/2023 1524   GFRNONAA >60 07/10/2023 1524   GFRAA 128 02/06/2021 0000   Lab Results  Component Value Date   HGBA1C 5.6 07/10/2023   HGBA1C 6.0 (H) 12/27/2020   Lab Results  Component Value Date   INSULIN 7.4 08/12/2022   INSULIN 20.4 12/27/2020   Lab Results  Component Value Date   TSH 0.22 (L) 02/04/2023   CBC    Component Value Date/Time   WBC 9.8 07/10/2023 1524   RBC 5.01 07/10/2023 1524   HGB 14.1 07/10/2023 1524   HGB 13.7 12/27/2020 0848   HCT 44.0 07/10/2023 1524   HCT 41.2 12/27/2020 0848   PLT 378 07/10/2023 1524   PLT 305 12/27/2020 0848   MCV 87.8 07/10/2023 1524   MCV 87 12/27/2020 0848   MCH 28.1 07/10/2023 1524   MCHC 32.0 07/10/2023 1524   RDW 13.1 07/10/2023 1524   RDW 13.2 12/27/2020 0848  Iron Studies    Component Value Date/Time   FERRITIN 88 02/06/2021 0000   Lipid Panel     Component Value Date/Time   CHOL 120 02/04/2023 0715   CHOL 141 12/27/2020 0848   TRIG 53.0 02/04/2023 0715   HDL 44.60 02/04/2023 0715   HDL 44 12/27/2020 0848   CHOLHDL 3 02/04/2023 0715   VLDL 10.6 02/04/2023 0715   LDLCALC 65 02/04/2023 0715   LDLCALC 81 12/27/2020 0848   Hepatic Function Panel     Component Value Date/Time   PROT 6.5 02/04/2023 0715   PROT 7.0 08/12/2022 1255   ALBUMIN 3.9 02/04/2023 0715   ALBUMIN 4.1 08/12/2022 1255   AST 13 02/04/2023 0715   ALT 15 02/04/2023 0715   ALKPHOS 93 02/04/2023 0715   BILITOT 0.4  02/04/2023 0715   BILITOT 0.6 08/12/2022 1255      Component Value Date/Time   TSH 0.22 (L) 02/04/2023 0715   Nutritional Lab Results  Component Value Date   VD25OH 41.3 08/12/2022   VD25OH 46.7 02/06/2022   VD25OH 76.3 06/18/2021     Assessment and Plan    Obesity   She has lost 2 pounds in the last month, following a pescetarian diet 75% of the time, engaging in water exercises for 60 minutes daily, and practicing intermittent fasting from 6 PM to 12 PM or later. She has been off phentermine for two months due to surgery, reports increased appetite, and requests to restart phentermine. No history of adverse effects from phentermine, and she has tolerated it well in the past. She is aware of the importance of protein intake and portion control.   - Refill phentermine 15 mg   - Continue pescetarian diet   - Continue water exercises   - Continue intermittent fasting   - Encourage protein intake through protein shakes and snacks    Hypertension   Hypertension is well-controlled with a blood pressure reading of 134/81 mmHg. She is on amlodipine and has requested a refill. No issues with blood pressure noted during the visit. She has been compliant with medication and lifestyle modifications.   - Refill amlodipine    General Health Maintenance   She is managing her diet and exercise well, focusing on protein intake and intermittent fasting, and is aware of portion control and avoiding high-sugar foods. She is on metformin and has an adequate supply.   - Encourage continued adherence to diet and exercise regimen   - Discuss strategies for managing sugar cravings   - Ensure adequate protein intake through shakes and snacks    Follow-up   - Confirm follow-up appointment on December 10th at 2 PM   - Schedule January follow-up appointment.       She was informed of the importance of frequent follow up visits to maximize her success with intensive lifestyle modifications for her  multiple health conditions.    Quillian Quince, MD

## 2023-09-23 DIAGNOSIS — Z96651 Presence of right artificial knee joint: Secondary | ICD-10-CM | POA: Diagnosis not present

## 2023-09-23 DIAGNOSIS — M25561 Pain in right knee: Secondary | ICD-10-CM | POA: Diagnosis not present

## 2023-09-26 ENCOUNTER — Encounter: Payer: Self-pay | Admitting: Obstetrics & Gynecology

## 2023-09-26 ENCOUNTER — Other Ambulatory Visit: Payer: Self-pay | Admitting: Medical

## 2023-09-26 DIAGNOSIS — I10 Essential (primary) hypertension: Secondary | ICD-10-CM

## 2023-09-27 ENCOUNTER — Encounter: Payer: Self-pay | Admitting: *Deleted

## 2023-09-30 ENCOUNTER — Encounter: Payer: Self-pay | Admitting: Medical

## 2023-09-30 ENCOUNTER — Encounter: Payer: Self-pay | Admitting: Orthopaedic Surgery

## 2023-09-30 ENCOUNTER — Ambulatory Visit (INDEPENDENT_AMBULATORY_CARE_PROVIDER_SITE_OTHER): Payer: BC Managed Care – PPO | Admitting: Orthopaedic Surgery

## 2023-09-30 ENCOUNTER — Ambulatory Visit: Payer: BC Managed Care – PPO | Admitting: Medical

## 2023-09-30 VITALS — BP 136/80 | HR 64 | Temp 97.8°F | Resp 18 | Ht 68.0 in | Wt 276.0 lb

## 2023-09-30 DIAGNOSIS — R7303 Prediabetes: Secondary | ICD-10-CM

## 2023-09-30 DIAGNOSIS — Z23 Encounter for immunization: Secondary | ICD-10-CM | POA: Diagnosis not present

## 2023-09-30 DIAGNOSIS — J029 Acute pharyngitis, unspecified: Secondary | ICD-10-CM | POA: Diagnosis not present

## 2023-09-30 DIAGNOSIS — I10 Essential (primary) hypertension: Secondary | ICD-10-CM

## 2023-09-30 DIAGNOSIS — R591 Generalized enlarged lymph nodes: Secondary | ICD-10-CM | POA: Diagnosis not present

## 2023-09-30 DIAGNOSIS — Z96651 Presence of right artificial knee joint: Secondary | ICD-10-CM

## 2023-09-30 LAB — CBC WITH DIFFERENTIAL/PLATELET
Basophils Absolute: 0 10*3/uL (ref 0.0–0.1)
Basophils Relative: 0.4 % (ref 0.0–3.0)
Eosinophils Absolute: 0.1 10*3/uL (ref 0.0–0.7)
Eosinophils Relative: 0.9 % (ref 0.0–5.0)
HCT: 42.4 % (ref 36.0–46.0)
Hemoglobin: 13.9 g/dL (ref 12.0–15.0)
Lymphocytes Relative: 21.6 % (ref 12.0–46.0)
Lymphs Abs: 1.8 10*3/uL (ref 0.7–4.0)
MCHC: 32.8 g/dL (ref 30.0–36.0)
MCV: 89.2 fL (ref 78.0–100.0)
Monocytes Absolute: 0.6 10*3/uL (ref 0.1–1.0)
Monocytes Relative: 7.1 % (ref 3.0–12.0)
Neutro Abs: 5.9 10*3/uL (ref 1.4–7.7)
Neutrophils Relative %: 70 % (ref 43.0–77.0)
Platelets: 360 10*3/uL (ref 150.0–400.0)
RBC: 4.75 Mil/uL (ref 3.87–5.11)
RDW: 13.5 % (ref 11.5–15.5)
WBC: 8.4 10*3/uL (ref 4.0–10.5)

## 2023-09-30 LAB — POCT RAPID STREP A (OFFICE): Rapid Strep A Screen: NEGATIVE

## 2023-09-30 MED ORDER — FLUTICASONE PROPIONATE 50 MCG/ACT NA SUSP: 2.0000 | Freq: Every day | NASAL | 1 refills | Status: DC

## 2023-09-30 MED ORDER — AMOXICILLIN-POT CLAVULANATE 875-125 MG PO TABS: 1.0000 | ORAL_TABLET | Freq: Two times a day (BID) | ORAL | 0 refills | Status: DC

## 2023-09-30 NOTE — Patient Instructions (Signed)
Pharyngitis with cervical lymphadenopathy Sore throat and swollen lymph nodes for approximately 2 weeks. Initial tooth pain resolved. No systemic symptoms. Tonsils reportedly enlarged. -Perform rapid strep test and send throat culture.(rapid strep negative) -Prescribe Augmentin for presumed bacterial infection as well as concern for rt side tooth infection -Order CBC to evaluate for infection. -Follow-up in 10-14 days to reassess lymph node size and tenderness. -If lymph nodes remain enlarged, order ultrasound of neck.  Hypertension Blood pressure elevated at visit, but patient reports typical readings around 130 systolic at home. Currently on Losartan 100mg  daily and Amlodipine 5mg  daily. -Continue current antihypertensive regimen. -Recheck blood pressure at next visit.  Prediabetes Last A1C was 5.6 two months ago. Currently on Metformin. -Continue Metformin. -No need for repeat A1C at this time as it was recently checked.   Ear pressure left side. tm normal. no wax -Use flonase otc for eustachian tube dysfunction   follow up 10-14 days or sooner

## 2023-09-30 NOTE — Addendum Note (Signed)
Addended by: Maximino Sarin on: 09/30/2023 09:29 AM   Modules accepted: Orders

## 2023-09-30 NOTE — Progress Notes (Signed)
   Subjective:    Patient ID: Laura Knox, female    DOB: 1966-05-09, 57 y.o.   MRN: [id number]  HPI  Discussed the use of AI scribe software for clinical note transcription with the patient, who gave verbal consent to proceed.  History of Present Illness   The patient initially presented with tooth pain on the right side, which quickly evolved into swelling in the same area. This was followed by the onset of a sore throat and drainage, localized to the right side. The patient noted the presence of swollen nodes in the neck area, which were more prominent when the head was lowered. The symptoms started shortly before Thanksgiving, with the tooth pain resolving on its own. The patient reported difficulty swallowing due to the sore throat and swollen lymph nodes. There was no exposure to sick individuals or children, and no other symptoms such as nasal congestion, post-nasal drip, sneezing, fever, chills, or body aches were reported.  The patient has a history of pre-diabetes, managed with metformin, and hypertension, managed with losartan and amlodipine. The patient also takes an appetite suppressant, phentermine. The patient's blood pressure readings at home are typically around 130/80. The patient also reported a stressful commute due to icy road conditions on the day of the consultation.          Review of Systems See hpi.    Objective:   Physical Exam  General Mental Status- Alert. General Appearance- Not in acute distress.   Skin General: Color- Normal Color. Moisture- Normal Moisture.  Neck Carotid Arteries- Normal color. Moisture- Normal Moisture. No carotid bruits. No JVD.  Chest and Lung Exam Auscultation: Breath Sounds:-Normal.  Cardiovascular Auscultation:Rythm- Regular. Murmurs & Other Heart Sounds:Auscultation of the heart reveals- No Murmurs.  Abdomen Inspection:-Inspeection Normal. Palpation/Percussion:Note:No mass. Palpation and Percussion of the abdomen  reveal- Non Tender, Non Distended + BS, no rebound or guarding.    Neurologic Cranial Nerve exam:- CN III-XII intact(No nystagmus), symmetric smile. Strength:- 5/5 equal and symmetric strength both upper and lower extremities.   Heent- no sinus pressure. Canals clear and normal tms. No tenderness to teeth though on inspection appears some potential cavities. Moderate posterior pharynx redness. Moderate enlarged left submandibular node that is tender. Mild enlarged left submandibular node.    Assessment & Plan:   Assessment and Plan    Pharyngitis with cervical lymphadenopathy Sore throat and swollen lymph nodes for approximately 2 weeks. Initial tooth pain resolved. No systemic symptoms. Tonsils reportedly enlarged. -Perform rapid strep test and send throat culture.(rapid strep negative) -Prescribe Augmentin for presumed bacterial infection as well as concern for rt side tooth infection -Order CBC to evaluate for infection. -Follow-up in 10-14 days to reassess lymph node size and tenderness. -If lymph nodes remain enlarged, order ultrasound of neck.  Hypertension Blood pressure elevated at visit, but patient reports typical readings around 130 systolic at home. Currently on Losartan 100mg  daily and Amlodipine 5mg  daily. -Continue current antihypertensive regimen. -Recheck blood pressure at next visit.  Prediabetes Last A1C was 5.6 two months ago. Currently on Metformin. -Continue Metformin. -No need for repeat A1C at this time as it was recently checked.   Ear pressure left side. tm normal. no wax -Use flonase otc for eustachian tube dysfunction   follow up 10-14 days or sooner   [employer]

## 2023-09-30 NOTE — Addendum Note (Signed)
Addended by: Gwenevere Abbot on: 09/30/2023 09:09 AM   Modules accepted: Orders

## 2023-09-30 NOTE — Progress Notes (Signed)
Post-Op Visit Note   Patient: Laura Knox           Date of Birth: [birth year]           MRN: [id number] Visit Date: 09/30/2023 PCP: Esperanza Richters, PA-C   Assessment & Plan:  Chief Complaint:  Chief Complaint  Patient presents with   Right Knee - Follow-up    Right total knee arthroplasty 07/21/2023   Visit Diagnoses:  1. Status post total right knee replacement     Plan: Zenna is 10 weeks postop from a right total knee replacement.  She has run out of physical therapy visits for 2024 but she plans to resume these in 2025.  Overall she is doing better.  Not requiring any pain medications.  Exam of the right knee shows fully healed surgical scar.  Range of motion has improved significantly.  She has full extension and about 95 degrees of flexion.  This is symmetric to the other side.  I am happy with Ivory's improvement.  She will continue with home exercises meantime until she can start therapy again in the new year.  Would like to recheck her in 3 months.  She will need repeat x-rays of both knees at that time.  Follow-Up Instructions: Return in about 3 months (around 12/29/2023) for with lindsey.   Orders:  No orders of the defined types were placed in this encounter.  No orders of the defined types were placed in this encounter.   Imaging: No results found.  PMFS History: Patient Active Problem List   Diagnosis Date Noted   Other constipation 08/12/2023   Status post total right knee replacement 07/21/2023   Depression 12/24/2022   BMI 40.0-44.9, adult (HCC) 12/24/2022   Obesity, Beginning BMI 61.01 12/24/2022   Status post total left knee replacement 11/22/2022   Primary osteoarthritis of right knee 11/21/2022   Left knee pain 10/14/2022   Achilles tendinitis 03/21/2022   Pre-diabetes 02/27/2022   Vitamin D deficiency 02/27/2022   At risk for diabetes mellitus 02/27/2022   Plantar fasciitis of right foot 01/08/2022   Special screening for malignant  neoplasms, colon    Rectal polyp    Essential hypertension 01/29/2021   Class 3 severe obesity with serious comorbidity and body mass index (BMI) of 60.0 to 69.9 in adult (HCC) 01/29/2021   Decreased functional activity tolerance 07/24/2020   Cervical spondylosis 07/24/2020   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  Did not have any issuses with anesthesia in January 2024.   Family history of adverse reaction to anesthesia    mother - nausea   History of kidney stones    Hypertension    Hypothyroidism    Joint pain    Knee pain    Osteoarthritis    Other fatigue    Pre-diabetes    Prediabetes    Sleep apnea    does not use CPAP   Thyroid disease    Vitamin D deficiency     Family History  Problem Relation Age of Onset   Healthy Mother    Obesity Mother    Cancer Father    Obesity Father    Cancer - Other Father    Stroke Maternal Grandmother  Lung cancer Maternal Grandfather     Past Surgical History:  Procedure Laterality Date   COLONOSCOPY WITH PROPOFOL N/A 04/10/2021   Procedure: COLONOSCOPY WITH PROPOFOL;  Surgeon: Lynann Bologna, MD;  Location: [facility];  Service: Endoscopy;  Laterality: N/A;   JOINT REPLACEMENT     KIDNEY STONE SURGERY     miniscus repair Left    POLYPECTOMY  04/10/2021   Procedure: POLYPECTOMY;  Surgeon: Lynann Bologna, MD;  Location: [facility];  Service: Endoscopy;;   SEPTOPLASTY     TOTAL KNEE ARTHROPLASTY Left 11/22/2022   Procedure: LEFT TOTAL KNEE REPLACEMENT;  Surgeon: Tarry Kos, MD;  Location: [facility];  Service: Orthopedics;  Laterality: Left;   TOTAL KNEE ARTHROPLASTY Right 07/21/2023   Procedure: RIGHT TOTAL KNEE REPLACEMENT;  Surgeon: Tarry Kos, MD;  Location: [facility];  Service: Orthopedics;   Laterality: Right;   Social History   Occupational History   Occupation: Retired  Tobacco Use   Smoking status: Never   Smokeless tobacco: Never  Vaping Use   Vaping status: Never Used  Substance and Sexual Activity   Alcohol use: Not Currently   Drug use: Never   Sexual activity: Yes    Partners: Male

## 2023-09-30 NOTE — Addendum Note (Signed)
Addended by: Maximino Sarin on: 09/30/2023 09:13 AM   Modules accepted: Orders

## 2023-10-02 LAB — CULTURE, GROUP A STREP
Micro Number: 15801998
SPECIMEN QUALITY:: ADEQUATE

## 2023-10-06 DIAGNOSIS — Z96651 Presence of right artificial knee joint: Secondary | ICD-10-CM | POA: Diagnosis not present

## 2023-10-06 DIAGNOSIS — M25561 Pain in right knee: Secondary | ICD-10-CM | POA: Diagnosis not present

## 2023-10-07 ENCOUNTER — Encounter (INDEPENDENT_AMBULATORY_CARE_PROVIDER_SITE_OTHER): Payer: Self-pay | Admitting: Family Medicine

## 2023-10-07 ENCOUNTER — Ambulatory Visit (INDEPENDENT_AMBULATORY_CARE_PROVIDER_SITE_OTHER): Payer: BC Managed Care – PPO | Admitting: Family Medicine

## 2023-10-07 VITALS — BP 115/75 | HR 81 | Temp 98.2°F | Ht 68.0 in | Wt 273.0 lb

## 2023-10-07 DIAGNOSIS — E559 Vitamin D deficiency, unspecified: Secondary | ICD-10-CM | POA: Diagnosis not present

## 2023-10-07 DIAGNOSIS — R7303 Prediabetes: Secondary | ICD-10-CM

## 2023-10-07 DIAGNOSIS — E785 Hyperlipidemia, unspecified: Secondary | ICD-10-CM | POA: Diagnosis not present

## 2023-10-07 DIAGNOSIS — E669 Obesity, unspecified: Secondary | ICD-10-CM

## 2023-10-07 DIAGNOSIS — I10 Essential (primary) hypertension: Secondary | ICD-10-CM | POA: Diagnosis not present

## 2023-10-07 DIAGNOSIS — Z6841 Body Mass Index (BMI) 40.0 and over, adult: Secondary | ICD-10-CM

## 2023-10-07 DIAGNOSIS — E7800 Pure hypercholesterolemia, unspecified: Secondary | ICD-10-CM

## 2023-10-07 MED ORDER — AMLODIPINE BESYLATE 5 MG PO TABS: 5.0000 mg | ORAL_TABLET | Freq: Every day | ORAL | 0 refills | Status: DC

## 2023-10-07 MED ORDER — PHENTERMINE HCL 15 MG PO CAPS: 15.0000 mg | ORAL_CAPSULE | ORAL | 0 refills | Status: DC

## 2023-10-07 NOTE — Addendum Note (Signed)
Addended by: Teressa Senter on: 10/07/2023 05:15 PM   Modules accepted: Orders

## 2023-10-07 NOTE — Progress Notes (Signed)
.smr  Office: [phone]  /  Fax: [phone]  WEIGHT SUMMARY AND BIOMETRICS  Anthropometric Measurements Height: 5\' 8"  (1.727 m) Weight: 273 lb (123.8 kg) BMI (Calculated): 41.52 Weight at Last Visit: 269 lb Weight Lost Since Last Visit: 0 Weight Gained Since Last Visit: 4 lb Starting Weight: 378 lb Total Weight Loss (lbs): 105 lb (47.6 kg) Peak Weight: 407 lb   Body Composition  Body Fat %: 52.7 % Fat Mass (lbs): 144.2 lbs Muscle Mass (lbs): 123 lbs Visceral Fat Rating : 17   Other Clinical Data Fasting: yes Labs: no Today's Visit #: 45 Starting Date: 12/27/20    Chief Complaint: OBESITY   History of Present Illness   The patient, currently managed on amlodipine and losartan for hypertension and phentermine 15 mg for obesity, presents for a routine follow-up. Her blood pressure is well-controlled at 115/75. She reports a recent weight gain of four pounds over the past month, attributed to the holiday season. Despite this setback, she has been adhering to a pescetarian diet about 75% of the time and exercising for approximately an hour most days of the week, incorporating weights, machines, and pool exercises.  The patient has been experiencing mid to upper back pain, which she believes is due to her current weight exceeding her frame's capacity. She expresses a desire to work with her fitness team to strengthen her back. The back pain is particularly bothersome after working in the kitchen, which she suspects may be due to the counter height being too low for her stature.  Despite the recent weight gain, the patient has noticed an improvement in her muscle mass, which she attributes to her consistent exercise regimen. She has been incorporating squats into her routine, which she believes is contributing to her muscle gain. She reports feeling stronger overall, with the exception of her back pain.  The patient is also on metformin, which she reports no issues with. She  has noticed an increase in appetite after being off phentermine for a while due to surgery, but she feels the phentermine is still helpful in controlling her appetite. She has not noticed any drastic changes in her appetite on days she does not take phentermine.          PHYSICAL EXAM:  Blood pressure 115/75, pulse 81, temperature 98.2 F (36.8 C), height 5\' 8"  (1.727 m), weight 273 lb (123.8 kg), SpO2 90%. Body mass index is 41.51 kg/m.  DIAGNOSTIC DATA REVIEWED:  BMET    Component Value Date/Time   NA 141 07/10/2023 1524   NA 142 08/12/2022 1255   K 3.6 07/10/2023 1524   CL 104 07/10/2023 1524   CO2 28 07/10/2023 1524   GLUCOSE 98 07/10/2023 1524   BUN 13 07/10/2023 1524   BUN 17 08/12/2022 1255   CREATININE 0.73 07/10/2023 1524   CALCIUM 8.8 (L) 07/10/2023 1524   GFRNONAA >60 07/10/2023 1524   GFRAA 128 02/06/2021 0000   Lab Results  Component Value Date   HGBA1C 5.6 07/10/2023   HGBA1C 6.0 (H) 12/27/2020   Lab Results  Component Value Date   INSULIN 7.4 08/12/2022   INSULIN 20.4 12/27/2020   Lab Results  Component Value Date   TSH 0.22 (L) 02/04/2023   CBC    Component Value Date/Time   WBC 8.4 09/30/2023 0915   RBC 4.75 09/30/2023 0915   HGB 13.9 09/30/2023 0915   HGB 13.7 12/27/2020 0848   HCT 42.4 09/30/2023 0915   HCT 41.2 12/27/2020 0848  PLT 360.0 09/30/2023 0915   PLT 305 12/27/2020 0848   MCV 89.2 09/30/2023 0915   MCV 87 12/27/2020 0848   MCH 28.1 07/10/2023 1524   MCHC 32.8 09/30/2023 0915   RDW 13.5 09/30/2023 0915   RDW 13.2 12/27/2020 0848   Iron Studies    Component Value Date/Time   FERRITIN 88 02/06/2021 0000   Lipid Panel     Component Value Date/Time   CHOL 120 02/04/2023 0715   CHOL 141 12/27/2020 0848   TRIG 53.0 02/04/2023 0715   HDL 44.60 02/04/2023 0715   HDL 44 12/27/2020 0848   CHOLHDL 3 02/04/2023 0715   VLDL 10.6 02/04/2023 0715   LDLCALC 65 02/04/2023 0715   LDLCALC 81 12/27/2020 0848   Hepatic Function  Panel     Component Value Date/Time   PROT 6.5 02/04/2023 0715   PROT 7.0 08/12/2022 1255   ALBUMIN 3.9 02/04/2023 0715   ALBUMIN 4.1 08/12/2022 1255   AST 13 02/04/2023 0715   ALT 15 02/04/2023 0715   ALKPHOS 93 02/04/2023 0715   BILITOT 0.4 02/04/2023 0715   BILITOT 0.6 08/12/2022 1255      Component Value Date/Time   TSH 0.22 (L) 02/04/2023 0715   Nutritional Lab Results  Component Value Date   VD25OH 41.3 08/12/2022   VD25OH 46.7 02/06/2022   VD25OH 76.3 06/18/2021     Assessment and Plan    Back Pain Mid to upper back pain, likely exacerbated by low kitchen counters. Discussed potential benefits of adjusting counter height or work Financial controller. Referral to sports medicine if pain persists. - Adjust counter height or work Financial controller - Refer to sports medicine if pain persists  Obesity Weight gain of 4 pounds over the last month, likely due to holiday eating. Following a pescetarian diet 75% of the time and exercising regularly. Currently on phentermine 15 mg daily, which helps control appetite. Discussed maintaining diet and exercise regimen. Encouraged to schedule a training session for back strengthening exercises. - Continue phentermine 15 mg daily - Maintain diet and exercise regimen - Schedule training session for back strengthening exercises  Hypertension Well-controlled with amlodipine and losartan. Blood pressure today is 115/75 mmHg. Refill requested for amlodipine. - Refill amlodipine  Prediabetes Last A1c was 5.6, fasting glucose 98. On metformin with no issues. Discussed monitoring B12 levels due to metformin use. Labs to be checked before the end of the month to utilize current year's deductible. - Check labs including kidneys, liver, electrolytes, A1c, insulin, cholesterol, B12, and vitamin D before the end of the month  Vit D deficiency -check labs   HLD -check labs  Follow-up - Return for labs before the end of the month - Follow-up  appointment in January - Schedule February appointment after the first of the year.       She was informed of the importance of frequent follow up visits to maximize her success with intensive lifestyle modifications for her multiple health conditions.    Quillian Quince, MD

## 2023-10-13 ENCOUNTER — Ambulatory Visit: Payer: BC Managed Care – PPO | Admitting: Medical

## 2023-10-13 VITALS — BP 118/62 | HR 84 | Resp 18 | Ht 68.0 in | Wt 279.0 lb

## 2023-10-13 DIAGNOSIS — E039 Hypothyroidism, unspecified: Secondary | ICD-10-CM

## 2023-10-13 DIAGNOSIS — R7303 Prediabetes: Secondary | ICD-10-CM | POA: Diagnosis not present

## 2023-10-13 DIAGNOSIS — E7800 Pure hypercholesterolemia, unspecified: Secondary | ICD-10-CM | POA: Diagnosis not present

## 2023-10-13 DIAGNOSIS — I10 Essential (primary) hypertension: Secondary | ICD-10-CM

## 2023-10-13 DIAGNOSIS — H6992 Unspecified Eustachian tube disorder, left ear: Secondary | ICD-10-CM | POA: Diagnosis not present

## 2023-10-13 DIAGNOSIS — E559 Vitamin D deficiency, unspecified: Secondary | ICD-10-CM | POA: Diagnosis not present

## 2023-10-13 LAB — T4, FREE: Free T4: 0.64 ng/dL (ref 0.60–1.60)

## 2023-10-13 LAB — TSH: TSH: 0.83 u[IU]/mL (ref 0.35–5.50)

## 2023-10-13 MED ORDER — LEVOTHYROXINE SODIUM 100 MCG PO TABS: 100.0000 ug | ORAL_TABLET | Freq: Every day | ORAL | 1 refills | Status: DC

## 2023-10-13 NOTE — Addendum Note (Signed)
Addended by: Gwenevere Abbot on: 10/13/2023 09:02 PM   Modules accepted: Orders

## 2023-10-13 NOTE — Progress Notes (Signed)
Subjective:    Patient ID: Laura Knox, female    DOB: Sep 03, 1966, 57 y.o.   MRN: [id number]  HPI  Discussed the use of AI scribe software for clinical note transcription with the patient, who gave verbal consent to proceed.  History of Present Illness   The patient, on Losartan 100mg  and Amlodipine daily, reports well-controlled blood pressure. She also takes Metformin twice daily for prediabetes, with an A1c of 5.6 in September. She reports a recent glucose reading below 100. The patient is also on Armour Thyroid 120mg  daily for thyroid management, but expresses concern about the cost of this medication.  Recently, the patient experienced pharyngitis and cervical lymphadenopathy. She reported a sore throat and moderate swelling, with the left submandibular node being larger and more tender than the right. After three doses of Augmentin, the sore throat resolved, and the lymph nodes returned to normal size a few days later. she did finish full course of antibiotic.  The patient also reports a sensation of 'sloshiness' in her ear, describing it as hearing and feeling liquid. She has been using Flonase for this issue.  Has helped.        Review of Systems  Constitutional:  Negative for chills and fever.  HENT:  Negative for congestion, ear pain, rhinorrhea and sinus pressure.   Respiratory:  Negative for cough and wheezing.   Cardiovascular:  Negative for chest pain and palpitations.  Gastrointestinal:  Negative for abdominal pain and nausea.  Musculoskeletal:  Negative for back pain and joint swelling.  Skin:  Negative for rash.  Neurological:  Negative for dizziness, seizures, weakness and light-headedness.  Hematological:  Negative for adenopathy. Does not bruise/bleed easily.  Psychiatric/Behavioral:  Negative for behavioral problems and confusion.     Past Medical History:  Diagnosis Date   B12 deficiency    Bilateral swelling of feet and ankles    Complication of  anesthesia    woke up feeling sore head to toe.  Did not have any issuses with anesthesia in January 2024.   Family history of adverse reaction to anesthesia    mother - nausea   History of kidney stones    Hypertension    Hypothyroidism    Joint pain    Knee pain    Osteoarthritis    Other fatigue    Pre-diabetes    Prediabetes    Sleep apnea    does not use CPAP   Thyroid disease    Vitamin D deficiency      Social History   Socioeconomic History   Marital status: Married    Spouse name: Greig Castilla   Number of children: 0   Years of education: Not on file   Highest education level: Not on file  Occupational History   Occupation: Retired  Tobacco Use   Smoking status: Never   Smokeless tobacco: Never  Vaping Use   Vaping status: Never Used  Substance and Sexual Activity   Alcohol use: Not Currently   Drug use: Never   Sexual activity: Yes    Partners: Male  Other Topics Concern   Not on file  Social History Narrative   Not on file   Social Drivers of Health   Financial Resource Strain: Low Risk  (03/17/2022)   Received from [facility], [facility]   Overall Financial Resource Strain (CARDIA)    Difficulty of Paying Living Expenses: Not hard at all  Food Insecurity: No Food Insecurity (03/17/2022)   Received from [facility]   Hunger Vital Sign    Worried About Running Out of Food in the Last Year: Never true    Ran Out of Food in the Last Year: Never true  Transportation Needs: Not on file  Physical Activity: Sufficiently Active (03/17/2022)   Received from [facility], [facility]   Exercise Vital Sign    Days of Exercise per Week: 5 days    Minutes of Exercise per Session: 60 min  Stress: No Stress Concern Present (03/17/2022)   Received from [facility], [facility] - Occupational Stress Questionnaire    Feeling of Stress : Not at all  Social Connections: Unknown (03/25/2023)    Received from [facility], [facility]   Social Network    Social Network: Not on file  Intimate Partner Violence: Unknown (03/25/2023)   Received from [facility], [facility]   HITS    Physically Hurt: Not on file    Insult or Talk Down To: Not on file    Threaten Physical Harm: Not on file    Scream or Curse: Not on file    Past Surgical History:  Procedure Laterality Date   COLONOSCOPY WITH PROPOFOL N/A 04/10/2021   Procedure: COLONOSCOPY WITH PROPOFOL;  Surgeon: Lynann Bologna, MD;  Location: [facility] ENDOSCOPY;  Service: Endoscopy;  Laterality: N/A;   JOINT REPLACEMENT     KIDNEY STONE SURGERY     miniscus repair Left    POLYPECTOMY  04/10/2021   Procedure: POLYPECTOMY;  Surgeon: Lynann Bologna, MD;  Location: [facility] ENDOSCOPY;  Service: Endoscopy;;   SEPTOPLASTY     TOTAL KNEE ARTHROPLASTY Left 11/22/2022   Procedure: LEFT TOTAL KNEE REPLACEMENT;  Surgeon: Tarry Kos, MD;  Location: [facility] OR;  Service: Orthopedics;  Laterality: Left;   TOTAL KNEE ARTHROPLASTY Right 07/21/2023   Procedure: RIGHT TOTAL KNEE REPLACEMENT;  Surgeon: Tarry Kos, MD;  Location: [facility] OR;  Service: Orthopedics;  Laterality: Right;    Family History  Problem Relation Age of Onset   Healthy Mother    Obesity Mother    Cancer Father    Obesity Father    Cancer - Other Father    Stroke Maternal Grandmother    Lung cancer Maternal Grandfather     Allergies  Allergen Reactions   Chlorthalidone Other (See Comments)    Bottomed out potassium     Current Outpatient Medications on File Prior to Visit  Medication Sig Dispense Refill   amLODipine (NORVASC) 5 MG tablet Take 1 tablet (5 mg total) by mouth daily. 30 tablet 0   amoxicillin-clavulanate (AUGMENTIN) 875-125 MG tablet Take 1 tablet by mouth 2 (two) times daily. 20 tablet 0   Ascorbic Acid (VITAMIN C) 1000 MG tablet Take 1,000 mg by mouth daily.     cetirizine (ZYRTEC) 10 MG tablet Take 10 mg by mouth daily.     estradiol (VIVELLE-DOT) 0.1  MG/24HR patch PLACE 1 PATCH (0.1 MG TOTAL) ONTO THE SKIN 2 (TWO) TIMES A WEEK. 8 patch 1   fluticasone (FLONASE) 50 MCG/ACT nasal spray Place 2 sprays into both nostrils daily. 16 g 1   losartan (COZAAR) 100 MG tablet TAKE 1 TABLET BY MOUTH EVERYDAY AT BEDTIME 30 tablet 0   metFORMIN (GLUCOPHAGE) 500 MG tablet 2 po with lunch qd 60 tablet 1   methocarbamol (ROBAXIN-750) 750 MG tablet Take 1 tablet (750 mg total) by mouth 2 (two) times daily as needed for muscle spasms. 20 tablet 2  methocarbamol (ROBAXIN-750) 750 MG tablet Take 1 tablet (750 mg total) by mouth 2 (two) times daily as needed for muscle spasms. 20 tablet 3   Multiple Vitamins-Minerals (HAIR/SKIN/NAILS/BIOTIN PO) Take 2 tablets by mouth daily.     Multiple Vitamins-Minerals (MULTIVITAMIN ADULT) CHEW Chew 2 each by mouth daily.     phentermine 15 MG capsule Take 1 capsule (15 mg total) by mouth every morning. 30 capsule 0   polyethylene glycol powder (GLYCOLAX/MIRALAX) 17 GM/SCOOP powder Take 1 Container by mouth daily.     progesterone (PROMETRIUM) 100 MG capsule Take 1 capsule (100 mg total) by mouth at bedtime. 90 capsule 3   thyroid (ARMOUR THYROID) 120 MG tablet TAKE 1 TABLET (120 MG TOTAL) BY MOUTH DAILY BEFORE BREAKFAST. 30 tablet 0   traMADol (ULTRAM) 50 MG tablet Take 1 tablet (50 mg total) by mouth 3 (three) times daily as needed. 30 tablet 4   No current facility-administered medications on file prior to visit.    BP 118/62   Pulse 84   Resp 18   Ht 5\' 8"  (1.727 m)   Wt 279 lb (126.6 kg)   SpO2 99%   BMI 42.42 kg/m        Objective:   Physical Exam  General Mental Status- Alert. General Appearance- Not in acute distress.   Skin General: Color- Normal Color. Moisture- Normal Moisture.  Neck No JVD.  Chest and Lung Exam Auscultation: Breath Sounds:-CTA  Cardiovascular Auscultation:Rythm- RRR Murmurs & Other Heart Sounds:Auscultation of the heart reveals- No  Murmurs.  Abdomen Inspection:-Inspeection Normal. Palpation/Percussion:Note:No mass. Palpation and Percussion of the abdomen reveal- Non Tender, Non Distended + BS, no rebound or guarding.   Neurologic Cranial Nerve exam:- CN III-XII intact(No nystagmus), symmetric smile. Strength:- 5/5 equal and symmetric strength both upper and lower extremities.       Assessment & Plan:   Assessment and Plan Patient Instructions  Hypertension Well controlled on Losartan 100mg  daily and Amlodipine 5mg  BID. -Continue current regimen.  Prediabetes A1c 5.6 in September, patient on Metformin 500mg  BID. -Continue Metformin. -Check recent A1c and glucose levels.  Hypothyroidism On Armour Thyroid 120mg  daily. TSH slightly low in April, T4 normal. -Order TSH and T4 today. -Consider switching to Levothyroxine for cost-effectiveness, pending discussion with pharmacist regarding dose equivalency.  Pharyngitis with cervical lymphadenopathy Resolved with Augmentin. -No further action required.  Eustachian tube dysfunction Patient reports intermittent pressure sensation and "sloshy" feeling in ear. Exam normal.  -Continue Flonase two sprays each nostril daily for at least two weeks. -let me know if still having symptoms in 2 weeks.  Follow-up To be determined based on TSH and T4 results.

## 2023-10-13 NOTE — Addendum Note (Signed)
Addended by: Gwenevere Abbot on: 10/13/2023 09:03 PM   Modules accepted: Orders

## 2023-10-13 NOTE — Patient Instructions (Addendum)
Hypertension Well controlled on Losartan 100mg  daily and Amlodipine 5mg  BID. -Continue current regimen.  Prediabetes A1c 5.6 in September, patient on Metformin 500mg  BID. -Continue Metformin. -Check recent A1c and glucose levels.  Hypothyroidism On Armour Thyroid 120mg  daily. TSH slightly low in April, T4 normal. -Order TSH and T4 today. -Consider switching to Levothyroxine for cost-effectiveness, pending discussion with pharmacist regarding dose equivalency.  Pharyngitis with cervical lymphadenopathy Resolved with Augmentin. -No further action required.  Eustachian tube dysfunction Patient reports intermittent pressure sensation and "sloshy" feeling in ear. Exam normal.  -Continue Flonase two sprays each nostril daily for at least two weeks. -let me know if still having symptoms in 2 weeks.  Follow-up To be determined based on TSH and T4 results.

## 2023-10-14 LAB — CMP14+EGFR
ALT: 9 [IU]/L (ref 0–32)
AST: 13 [IU]/L (ref 0–40)
Albumin: 4.1 g/dL (ref 3.8–4.9)
Alkaline Phosphatase: 112 [IU]/L (ref 44–121)
BUN/Creatinine Ratio: 23 (ref 9–23)
BUN: 15 mg/dL (ref 6–24)
Bilirubin Total: 0.4 mg/dL (ref 0.0–1.2)
CO2: 23 mmol/L (ref 20–29)
Calcium: 9 mg/dL (ref 8.7–10.2)
Chloride: 104 mmol/L (ref 96–106)
Creatinine, Ser: 0.66 mg/dL (ref 0.57–1.00)
Globulin, Total: 3.1 g/dL (ref 1.5–4.5)
Glucose: 92 mg/dL (ref 70–99)
Potassium: 4.4 mmol/L (ref 3.5–5.2)
Sodium: 142 mmol/L (ref 134–144)
Total Protein: 7.2 g/dL (ref 6.0–8.5)
eGFR: 102 mL/min/{1.73_m2} (ref >59)

## 2023-10-14 LAB — LIPID PANEL WITH LDL/HDL RATIO
Cholesterol, Total: 147 mg/dL (ref 100–199)
HDL: 54 mg/dL (ref >39)
LDL Chol Calc (NIH): 80 mg/dL (ref 0–99)
LDL/HDL Ratio: 1.5 {ratio} (ref 0.0–3.2)
Triglycerides: 64 mg/dL (ref 0–149)
VLDL Cholesterol Cal: 13 mg/dL (ref 5–40)

## 2023-10-14 LAB — HEMOGLOBIN A1C
Est. average glucose Bld gHb Est-mCnc: 114 mg/dL
Hgb A1c MFr Bld: 5.6 % (ref 4.8–5.6)

## 2023-10-14 LAB — VITAMIN D 25 HYDROXY (VIT D DEFICIENCY, FRACTURES): Vit D, 25-Hydroxy: 30.4 ng/mL (ref 30.0–100.0)

## 2023-10-14 LAB — VITAMIN B12: Vitamin B-12: 451 pg/mL (ref 232–1245)

## 2023-10-14 LAB — INSULIN, RANDOM: INSULIN: 9.6 u[IU]/mL (ref 2.6–24.9)

## 2023-10-21 ENCOUNTER — Other Ambulatory Visit: Payer: Self-pay | Admitting: Medical

## 2023-10-21 DIAGNOSIS — I10 Essential (primary) hypertension: Secondary | ICD-10-CM

## 2023-10-22 ENCOUNTER — Other Ambulatory Visit: Payer: Self-pay | Admitting: Medical

## 2023-11-03 DIAGNOSIS — Z96651 Presence of right artificial knee joint: Secondary | ICD-10-CM | POA: Diagnosis not present

## 2023-11-03 DIAGNOSIS — M25561 Pain in right knee: Secondary | ICD-10-CM | POA: Diagnosis not present

## 2023-11-04 ENCOUNTER — Encounter (INDEPENDENT_AMBULATORY_CARE_PROVIDER_SITE_OTHER): Payer: Self-pay | Admitting: Family Medicine

## 2023-11-04 ENCOUNTER — Ambulatory Visit (INDEPENDENT_AMBULATORY_CARE_PROVIDER_SITE_OTHER): Payer: Self-pay | Admitting: Family Medicine

## 2023-11-04 VITALS — BP 129/85 | HR 69 | Temp 97.9°F | Ht 68.0 in | Wt 275.0 lb

## 2023-11-04 DIAGNOSIS — M25561 Pain in right knee: Secondary | ICD-10-CM

## 2023-11-04 DIAGNOSIS — G8929 Other chronic pain: Secondary | ICD-10-CM

## 2023-11-04 DIAGNOSIS — R7303 Prediabetes: Secondary | ICD-10-CM

## 2023-11-04 DIAGNOSIS — E669 Obesity, unspecified: Secondary | ICD-10-CM

## 2023-11-04 DIAGNOSIS — Z6841 Body Mass Index (BMI) 40.0 and over, adult: Secondary | ICD-10-CM

## 2023-11-04 DIAGNOSIS — M25562 Pain in left knee: Secondary | ICD-10-CM

## 2023-11-04 MED ORDER — PHENTERMINE HCL 15 MG PO CAPS: 15.0000 mg | ORAL_CAPSULE | ORAL | 0 refills | Status: DC

## 2023-11-04 MED ORDER — METFORMIN HCL 500 MG PO TABS: ORAL_TABLET | ORAL | 0 refills | Status: DC

## 2023-11-04 NOTE — Progress Notes (Signed)
 .smr  Office: [phone]  /  Fax: [phone]  WEIGHT SUMMARY AND BIOMETRICS  Anthropometric Measurements Height: 5' 8 (1.727 m) Weight: 275 lb (124.7 kg) BMI (Calculated): 41.82 Weight at Last Visit: 273 lb Weight Lost Since Last Visit: 0 Weight Gained Since Last Visit: 2 lb Starting Weight: 378 lb Total Weight Loss (lbs): 103 lb (46.7 kg) Peak Weight: 407 lb   Body Composition  Body Fat %: 53.2 % Fat Mass (lbs): 146.8 lbs Muscle Mass (lbs): 122.4 lbs Visceral Fat Rating : 17   Other Clinical Data Fasting: Yes Labs: No Today's Visit #: 7 Starting Date: 12/27/20    Chief Complaint: OBESITY  History of Present Illness   The patient, with a history of prediabetes and obesity, presents for a follow-up visit. She reports a recent weight gain of two pounds over the holiday period, despite adherence to a pescetarian diet 75% of the time and regular exercise, including water  and strengthening exercises for about 60 minutes most days of the week. The patient is currently on metformin  for prediabetes and phentermine  15 mg for weight loss, both of which she requests to be refilled.  The patient identifies sugar as a significant dietary temptation, particularly during the holiday season. She expresses difficulty in managing cravings once sugar is reintroduced into her diet. Despite these challenges, the patient is making efforts to maintain a healthy diet, incorporating protein and water  into her meals, and keeping healthy snacks like almonds readily available.  The patient also reports ongoing issues with her knees, which she is managing with physical therapy. She has requested a comprehensive plan from her physical therapist to address both knees simultaneously. Despite the physical challenges, the patient maintains a regular exercise routine, alternating between water  exercises and machine workouts.  The patient's blood pressure was initially elevated but normalized upon  rechecking. She reports no issues with her current medications. The patient is retired and reports a good social life, with regular outings and social engagements. She is planning a trip to [place]  in April to visit family and friends. The patient's stress levels are reportedly low, with no work-related stress and stable health in her parents.          PHYSICAL EXAM:  Blood pressure 129/85, pulse 69, temperature 97.9 F (36.6 C), height 5' 8 (1.727 m), weight 275 lb (124.7 kg), SpO2 99%. Body mass index is 41.81 kg/m.  DIAGNOSTIC DATA REVIEWED:  BMET    Component Value Date/Time   NA 142 10/13/2023 0946   K 4.4 10/13/2023 0946   CL 104 10/13/2023 0946   CO2 23 10/13/2023 0946   GLUCOSE 92 10/13/2023 0946   GLUCOSE 98 07/10/2023 1524   BUN 15 10/13/2023 0946   CREATININE 0.66 10/13/2023 0946   CALCIUM 9.0 10/13/2023 0946   GFRNONAA >60 07/10/2023 1524   GFRAA 128 02/06/2021 0000   Lab Results  Component Value Date   HGBA1C 5.6 10/13/2023   HGBA1C 6.0 (H) 12/27/2020   Lab Results  Component Value Date   INSULIN  9.6 10/13/2023   INSULIN  20.4 12/27/2020   Lab Results  Component Value Date   TSH 0.83 10/13/2023   CBC    Component Value Date/Time   WBC 8.4 09/30/2023 0915   RBC 4.75 09/30/2023 0915   HGB 13.9 09/30/2023 0915   HGB 13.7 12/27/2020 0848   HCT 42.4 09/30/2023 0915   HCT 41.2 12/27/2020 0848   PLT 360.0 09/30/2023 0915   PLT 305 12/27/2020 0848   MCV 89.2 09/30/2023  0915   MCV 87 12/27/2020 0848   MCH 28.1 07/10/2023 1524   MCHC 32.8 09/30/2023 0915   RDW 13.5 09/30/2023 0915   RDW 13.2 12/27/2020 0848   Iron Studies    Component Value Date/Time   FERRITIN 88 02/06/2021 0000   Lipid Panel     Component Value Date/Time   CHOL 147 10/13/2023 0946   TRIG 64 10/13/2023 0946   HDL 54 10/13/2023 0946   CHOLHDL 3 02/04/2023 0715   VLDL 10.6 02/04/2023 0715   LDLCALC 80 10/13/2023 0946   Hepatic Function Panel     Component Value  Date/Time   PROT 7.2 10/13/2023 0946   ALBUMIN 4.1 10/13/2023 0946   AST 13 10/13/2023 0946   ALT 9 10/13/2023 0946   ALKPHOS 112 10/13/2023 0946   BILITOT 0.4 10/13/2023 0946      Component Value Date/Time   TSH 0.83 10/13/2023 1114   Nutritional Lab Results  Component Value Date   VD25OH 30.4 10/13/2023   VD25OH 41.3 08/12/2022   VD25OH 46.7 02/06/2022     Assessment and Plan    Obesity Obesity managed with phentermine  and lifestyle modifications. Engaging in regular water  and strengthening exercises. Reports difficulty with sugar cravings but is working on maintaining a healthy diet. Discussed the benefits of phentermine  for weight loss and the importance of a balanced diet and regular exercise. Explained that phentermine  is an appetite suppressant and should be used in conjunction with lifestyle changes for optimal results. - Refill phentermine  - Continue water  and strengthening exercises - Maintain pescetarian diet with focus on protein intake - Encourage adherence to fasting schedule  Prediabetes Prediabetes managed with metformin  and lifestyle modifications. Reports adherence to diet and exercise regimen 75% of the time. Gained 2 pounds over the holidays but is not concerned. Blood pressure normalized on recheck. Discussed the importance of maintaining a healthy diet and exercise regimen to prevent progression to diabetes. Explained that metformin  helps control blood sugar levels but lifestyle changes are crucial for long-term management. - Refill metformin  - Continue current diet and exercise regimen  Knee Pain Chronic knee pain managed with physical therapy. Reports improvement with exercises but notes challenges with both knees. Recently restarted physical therapy with a comprehensive plan for both knees. Discussed the importance of adherence to physical therapy exercises to improve knee function and reduce pain. Explained that consistent exercise can help strengthen the  muscles around the knee and improve stability. - Continue physical therapy - Perform recommended exercises  General Health Maintenance Retired, managing stress well, and engaging in social activities. No current work-related stress. Parents are in good health. Discussed the importance of maintaining social connections and managing stress for overall well-being. - Schedule next appointment in March - Encourage continued social engagement and stress management.       She was informed of the importance of frequent follow up visits to maximize her success with intensive lifestyle modifications for her multiple health conditions.    Laura Penton, MD

## 2023-11-09 ENCOUNTER — Other Ambulatory Visit: Payer: Self-pay | Admitting: Medical

## 2023-11-10 DIAGNOSIS — Z96651 Presence of right artificial knee joint: Secondary | ICD-10-CM | POA: Diagnosis not present

## 2023-11-10 DIAGNOSIS — M25561 Pain in right knee: Secondary | ICD-10-CM | POA: Diagnosis not present

## 2023-11-12 DIAGNOSIS — M25561 Pain in right knee: Secondary | ICD-10-CM | POA: Diagnosis not present

## 2023-11-12 DIAGNOSIS — Z96651 Presence of right artificial knee joint: Secondary | ICD-10-CM | POA: Diagnosis not present

## 2023-11-18 DIAGNOSIS — Z96651 Presence of right artificial knee joint: Secondary | ICD-10-CM | POA: Diagnosis not present

## 2023-11-18 DIAGNOSIS — M25561 Pain in right knee: Secondary | ICD-10-CM | POA: Diagnosis not present

## 2023-11-20 DIAGNOSIS — Z96651 Presence of right artificial knee joint: Secondary | ICD-10-CM | POA: Diagnosis not present

## 2023-11-20 DIAGNOSIS — M25561 Pain in right knee: Secondary | ICD-10-CM | POA: Diagnosis not present

## 2023-11-21 ENCOUNTER — Other Ambulatory Visit: Payer: Self-pay | Admitting: Medical

## 2023-11-24 DIAGNOSIS — Z96651 Presence of right artificial knee joint: Secondary | ICD-10-CM | POA: Diagnosis not present

## 2023-11-24 DIAGNOSIS — M25561 Pain in right knee: Secondary | ICD-10-CM | POA: Diagnosis not present

## 2023-11-25 ENCOUNTER — Other Ambulatory Visit (INDEPENDENT_AMBULATORY_CARE_PROVIDER_SITE_OTHER): Payer: Self-pay | Admitting: Family Medicine

## 2023-11-25 DIAGNOSIS — I10 Essential (primary) hypertension: Secondary | ICD-10-CM

## 2023-11-28 DIAGNOSIS — Z96651 Presence of right artificial knee joint: Secondary | ICD-10-CM | POA: Diagnosis not present

## 2023-11-28 DIAGNOSIS — M25561 Pain in right knee: Secondary | ICD-10-CM | POA: Diagnosis not present

## 2023-12-03 ENCOUNTER — Ambulatory Visit (INDEPENDENT_AMBULATORY_CARE_PROVIDER_SITE_OTHER): Payer: Self-pay | Admitting: Family Medicine

## 2023-12-03 ENCOUNTER — Encounter (INDEPENDENT_AMBULATORY_CARE_PROVIDER_SITE_OTHER): Payer: Self-pay | Admitting: Family Medicine

## 2023-12-03 VITALS — BP 169/82 | HR 76 | Temp 98.0°F | Ht 68.0 in | Wt 280.0 lb

## 2023-12-03 DIAGNOSIS — E669 Obesity, unspecified: Secondary | ICD-10-CM

## 2023-12-03 DIAGNOSIS — Z6841 Body Mass Index (BMI) 40.0 and over, adult: Secondary | ICD-10-CM

## 2023-12-03 DIAGNOSIS — R7303 Prediabetes: Secondary | ICD-10-CM

## 2023-12-03 DIAGNOSIS — I10 Essential (primary) hypertension: Secondary | ICD-10-CM

## 2023-12-03 DIAGNOSIS — Z96651 Presence of right artificial knee joint: Secondary | ICD-10-CM | POA: Diagnosis not present

## 2023-12-03 DIAGNOSIS — M25561 Pain in right knee: Secondary | ICD-10-CM | POA: Diagnosis not present

## 2023-12-03 MED ORDER — METFORMIN HCL 500 MG PO TABS: ORAL_TABLET | ORAL | 0 refills | Status: DC

## 2023-12-03 NOTE — Progress Notes (Signed)
 .smr  Office: [phone]  /  Fax: [phone]  WEIGHT SUMMARY AND BIOMETRICS  Anthropometric Measurements Height: 5' 8 (1.727 m) Weight: 280 lb (127 kg) BMI (Calculated): 42.58 Weight at Last Visit: 275 lb Weight Lost Since Last Visit: 0 Weight Gained Since Last Visit: 5 lb Starting Weight: 378 lb Total Weight Loss (lbs): 98 lb (44.5 kg) Peak Weight: 407 lb   Body Composition  Body Fat %: 54.1 % Fat Mass (lbs): 151.6 lbs Muscle Mass (lbs): 122.4 lbs Visceral Fat Rating : 18   Other Clinical Data Fasting: No Labs: No Today's Visit #: 55 Starting Date: 12/27/20    Chief Complaint: OBESITY    History of Present Illness   Laura Knox is a 58 year old female with obesity who presents to discuss her obesity.  She has gained five pounds over the past month despite adhering to a pescetarian diet plan 75% of the time and engaging in 90 minutes of daily exercise. She experiences increased hunger and difficulty with meal planning, which she attributes to her increased physical activity, including physical therapy and pool classes. She is currently taking phentermine  for weight management but is concerned about her hunger levels and the potential need for a dosage adjustment.  She has a history of hypertension with recent blood pressure readings of 178/76 and 169/82. She is on amlodipine  for hypertension management and notes that her blood pressure may be influenced by recent physical activity and pain from physical therapy.  She has a history of prediabetes and is taking metformin . She is mindful of her fasting schedule, which varies slightly day-to-day, and is aware of the impact of her diet and exercise on her condition.          PHYSICAL EXAM:  Blood pressure (!) 169/82, pulse 76, temperature 98 F (36.7 C), height 5' 8 (1.727 m), weight 280 lb (127 kg), SpO2 99%. Body mass index is 42.57 kg/m.  DIAGNOSTIC DATA REVIEWED:  BMET    Component Value Date/Time    NA 142 10/13/2023 0946   K 4.4 10/13/2023 0946   CL 104 10/13/2023 0946   CO2 23 10/13/2023 0946   GLUCOSE 92 10/13/2023 0946   GLUCOSE 98 07/10/2023 1524   BUN 15 10/13/2023 0946   CREATININE 0.66 10/13/2023 0946   CALCIUM 9.0 10/13/2023 0946   GFRNONAA >60 07/10/2023 1524   GFRAA 128 02/06/2021 0000   Lab Results  Component Value Date   HGBA1C 5.6 10/13/2023   HGBA1C 6.0 (H) 12/27/2020   Lab Results  Component Value Date   INSULIN  9.6 10/13/2023   INSULIN  20.4 12/27/2020   Lab Results  Component Value Date   TSH 0.83 10/13/2023   CBC    Component Value Date/Time   WBC 8.4 09/30/2023 0915   RBC 4.75 09/30/2023 0915   HGB 13.9 09/30/2023 0915   HGB 13.7 12/27/2020 0848   HCT 42.4 09/30/2023 0915   HCT 41.2 12/27/2020 0848   PLT 360.0 09/30/2023 0915   PLT 305 12/27/2020 0848   MCV 89.2 09/30/2023 0915   MCV 87 12/27/2020 0848   MCH 28.1 07/10/2023 1524   MCHC 32.8 09/30/2023 0915   RDW 13.5 09/30/2023 0915   RDW 13.2 12/27/2020 0848   Iron Studies    Component Value Date/Time   FERRITIN 88 02/06/2021 0000   Lipid Panel     Component Value Date/Time   CHOL 147 10/13/2023 0946   TRIG 64 10/13/2023 0946   HDL 54 10/13/2023 0946  CHOLHDL 3 02/04/2023 0715   VLDL 10.6 02/04/2023 0715   LDLCALC 80 10/13/2023 0946   Hepatic Function Panel     Component Value Date/Time   PROT 7.2 10/13/2023 0946   ALBUMIN 4.1 10/13/2023 0946   AST 13 10/13/2023 0946   ALT 9 10/13/2023 0946   ALKPHOS 112 10/13/2023 0946   BILITOT 0.4 10/13/2023 0946      Component Value Date/Time   TSH 0.83 10/13/2023 1114   Nutritional Lab Results  Component Value Date   VD25OH 30.4 10/13/2023   VD25OH 41.3 08/12/2022   VD25OH 46.7 02/06/2022     Assessment and Plan    Obesity Reports a weight gain of five pounds in the last month. Following a pescetarian diet plan 75% of the time and exercises 90 minutes daily. Currently on phentermine  but struggling with hunger and  meal planning. Discussed the need to recheck metabolism and the importance of journaling food intake. Explained that phentermine  cannot be refilled if blood pressure remains elevated due to the risk of myocardial infarction. Discussed the benefits of front-loading protein intake and the potential need for psychological support to address hunger and diet challenges. - Recheck metabolism with fasting for 8 hours, no caffeine, nicotine, or exercise before the test - Arrive 30 minutes before the next appointment for metabolism test and labs - Continue journaling food intake with a goal of 1500 calories/day and at least 100 grams of protein/day - Front-load protein intake with at least 30 grams before lunch - Referral to psychologist for additional support with diet and hunger management  Hypertension Current readings of 178/76, 169/82, and 150/94. Elevated blood pressure may be influenced by recent physical therapy and exercise. Phentermine  cannot be refilled due to elevated blood pressure. Discussed the need to monitor blood pressure at home and the potential need to adjust antihypertensives if blood pressure does not stabilize. - Refill amlodipine  - Check blood pressure at home at least twice a week and bring readings to the next visit - Reassess phentermine  prescription if blood pressure stabilizes - Follow up in four weeks  Prediabetes Currently on metformin . - Refill metformin   Follow-up - Arrive 30 min early nest visit for metabolism test and labs.          She was informed of the importance of frequent follow up visits to maximize her success with intensive lifestyle modifications for her multiple health conditions.    Louann Penton, MD

## 2023-12-04 ENCOUNTER — Encounter (INDEPENDENT_AMBULATORY_CARE_PROVIDER_SITE_OTHER): Payer: Self-pay | Admitting: Family Medicine

## 2023-12-04 DIAGNOSIS — I10 Essential (primary) hypertension: Secondary | ICD-10-CM

## 2023-12-10 DIAGNOSIS — M25561 Pain in right knee: Secondary | ICD-10-CM | POA: Diagnosis not present

## 2023-12-10 DIAGNOSIS — Z96651 Presence of right artificial knee joint: Secondary | ICD-10-CM | POA: Diagnosis not present

## 2023-12-10 MED ORDER — AMLODIPINE BESYLATE 5 MG PO TABS: 5.0000 mg | ORAL_TABLET | Freq: Every day | ORAL | 0 refills | Status: DC

## 2023-12-10 NOTE — Telephone Encounter (Signed)
LAST APPOINTMENT DATE: 12/03/23 NEXT APPOINTMENT DATE: 01/05/24   [id number] - [place], [place] - [address] [place] [address] Phone: [phone] Fax: [phone]  [facility] - [place], [place] - [address] [place] [address] Phone: [phone] Fax: [phone]  Patient is requesting a refill of the following medications: Requested Prescriptions   Pending Prescriptions Disp Refills   amLODipine (NORVASC) 5 MG tablet 30 tablet 0    Sig: Take 1 tablet (5 mg total) by mouth daily.    Date last filled: 10/07/23 Previously prescribed by Quillian Quince, MD  Lab Results  Component Value Date   HGBA1C 5.6 10/13/2023   HGBA1C 5.6 07/10/2023   HGBA1C 5.7 (H) 04/02/2023   Lab Results  Component Value Date   LDLCALC 80 10/13/2023   CREATININE 0.66 10/13/2023   Lab Results  Component Value Date   VD25OH 30.4 10/13/2023   VD25OH 41.3 08/12/2022   VD25OH 46.7 02/06/2022    BP Readings from Last 3 Encounters:  12/03/23 (!) 169/82  11/04/23 129/85  10/13/23 118/62

## 2023-12-17 DIAGNOSIS — M25561 Pain in right knee: Secondary | ICD-10-CM | POA: Diagnosis not present

## 2023-12-17 DIAGNOSIS — Z96651 Presence of right artificial knee joint: Secondary | ICD-10-CM | POA: Diagnosis not present

## 2023-12-23 DIAGNOSIS — Z96651 Presence of right artificial knee joint: Secondary | ICD-10-CM | POA: Diagnosis not present

## 2023-12-23 DIAGNOSIS — M25561 Pain in right knee: Secondary | ICD-10-CM | POA: Diagnosis not present

## 2023-12-24 ENCOUNTER — Other Ambulatory Visit (INDEPENDENT_AMBULATORY_CARE_PROVIDER_SITE_OTHER): Payer: Self-pay | Admitting: Family Medicine

## 2023-12-24 DIAGNOSIS — I10 Essential (primary) hypertension: Secondary | ICD-10-CM

## 2023-12-30 ENCOUNTER — Ambulatory Visit: Payer: BC Managed Care – PPO | Admitting: Physician Assistant

## 2023-12-30 ENCOUNTER — Other Ambulatory Visit (INDEPENDENT_AMBULATORY_CARE_PROVIDER_SITE_OTHER): Payer: Self-pay

## 2023-12-30 ENCOUNTER — Encounter: Payer: Self-pay | Admitting: Physician Assistant

## 2023-12-30 DIAGNOSIS — Z96652 Presence of left artificial knee joint: Secondary | ICD-10-CM

## 2023-12-30 DIAGNOSIS — Z96651 Presence of right artificial knee joint: Secondary | ICD-10-CM

## 2023-12-30 DIAGNOSIS — Z96653 Presence of artificial knee joint, bilateral: Secondary | ICD-10-CM | POA: Diagnosis not present

## 2023-12-30 NOTE — Progress Notes (Signed)
 Post-Op Visit Note   Patient: Laura Knox           Date of Birth: 10-17-1966           MRN: [id number] Visit Date: 12/30/2023 PCP: Esperanza Richters, PA-C   Assessment & Plan:  Chief Complaint:  Chief Complaint  Patient presents with   Right Knee - Follow-up    Right total knee arthroplasty 07/21/2023   Left Knee - Follow-up    Left total knee arthroplasty 11/22/2022   Visit Diagnoses:  1. Status post total right knee replacement   2. Status post total left knee replacement     Plan: Patient is a pleasant 58 year old female who comes in today a little over 1 year status post left total knee replacement in approximately 6 months status post right total knee replacement.  She has been doing well in regards to the left knee.  She has been having some pain to the medial aspect of the right knee since the first of the year.  She does note that she started Silver sneakers around that time as well which she thinks aggravated her symptoms.  The only time she has this pain is when she is doing exercises where she is loading the knee and comes up and releases that pressure.  She is not taking anything for pain.  She does note she has taken 2 days off at the gym and her pain has improved.  She is still in physical therapy and has been working out at [employer]  Overall, she feels she has improved quite a bit.  Examination of both knees reveals range of motion from 0 to 120 degrees.  She is stable to valgus varus stress.  She does have tenderness along the medial joint line on the right.  She is neurovascularly intact distally.  At this point, she will take a break at the gym for a bit longer.  When she returns, she will avoid the exercises that are causing pain to the medial knee.  She will follow-up with [place] in 6 months for repeat evaluation and x-rays of both knees.  Call with concerns or questions.  Follow-Up Instructions: Return in about 6 months (around 07/01/2024).   Orders:  Orders Placed This  Encounter  Procedures   XR Knee 1-2 Views Right   XR Knee 1-2 Views Left   No orders of the defined types were placed in this encounter.   Imaging: XR Knee 1-2 Views Right Result Date: 12/30/2023 Well-seated prosthesis.  Slight lucency to the medial tibial tray which is relatively unchanged compared to previous x-rays  XR Knee 1-2 Views Left Result Date: 12/30/2023 Well-seated prosthesis without complication   PMFS History: Patient Active Problem List   Diagnosis Date Noted   Other constipation 08/12/2023   Status post total right knee replacement 07/21/2023   Depression 12/24/2022   BMI 40.0-44.9, adult (HCC) 12/24/2022   Obesity, Beginning BMI 61.01 12/24/2022   Status post total left knee replacement 11/22/2022   Primary osteoarthritis of right knee 11/21/2022   Left knee pain 10/14/2022   Achilles tendinitis 03/21/2022   Pre-diabetes 02/27/2022   Vitamin D deficiency 02/27/2022   At risk for diabetes mellitus 02/27/2022   Plantar fasciitis of right foot 01/08/2022   Special screening for malignant neoplasms, colon    Rectal polyp    Essential hypertension 01/29/2021   Class 3 severe obesity with serious comorbidity and body mass index (BMI) of 60.0 to 69.9 in adult [facility])  01/29/2021   Decreased functional activity tolerance 07/24/2020   Cervical spondylosis 07/24/2020   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  Did not have any issuses with anesthesia in January 2024.   Family history of adverse reaction to anesthesia    mother - nausea   History of kidney stones    Hypertension    Hypothyroidism    Joint pain    Knee pain    Osteoarthritis    Other fatigue    Pre-diabetes    Prediabetes    Sleep apnea    does not use CPAP   Thyroid disease     Vitamin D deficiency     Family History  Problem Relation Age of Onset   Healthy Mother    Obesity Mother    Cancer Father    Obesity Father    Cancer - Other Father    Stroke Maternal Grandmother    Lung cancer Maternal Grandfather     Past Surgical History:  Procedure Laterality Date   COLONOSCOPY WITH PROPOFOL N/A 04/10/2021   Procedure: COLONOSCOPY WITH PROPOFOL;  Surgeon: Lynann Bologna, MD;  Location: [facility];  Service: Endoscopy;  Laterality: N/A;   JOINT REPLACEMENT     KIDNEY STONE SURGERY     miniscus repair Left    POLYPECTOMY  04/10/2021   Procedure: POLYPECTOMY;  Surgeon: Lynann Bologna, MD;  Location: [facility];  Service: Endoscopy;;   SEPTOPLASTY     TOTAL KNEE ARTHROPLASTY Left 11/22/2022   Procedure: LEFT TOTAL KNEE REPLACEMENT;  Surgeon: Tarry Kos, MD;  Location: [facility];  Service: Orthopedics;  Laterality: Left;   TOTAL KNEE ARTHROPLASTY Right 07/21/2023   Procedure: RIGHT TOTAL KNEE REPLACEMENT;  Surgeon: Tarry Kos, MD;  Location: [facility];  Service: Orthopedics;  Laterality: Right;   Social History   Occupational History   Occupation: Retired  Tobacco Use   Smoking status: Never   Smokeless tobacco: Never  Vaping Use   Vaping status: Never Used  Substance and Sexual Activity   Alcohol use: Not Currently   Drug use: Never   Sexual activity: Yes    Partners: Male

## 2023-12-31 ENCOUNTER — Telehealth: Payer: Self-pay | Admitting: *Deleted

## 2023-12-31 DIAGNOSIS — M25561 Pain in right knee: Secondary | ICD-10-CM | POA: Diagnosis not present

## 2023-12-31 DIAGNOSIS — Z96651 Presence of right artificial knee joint: Secondary | ICD-10-CM | POA: Diagnosis not present

## 2023-12-31 NOTE — Telephone Encounter (Signed)
Left patient a message to call and schedule annual. 

## 2024-01-05 ENCOUNTER — Ambulatory Visit (INDEPENDENT_AMBULATORY_CARE_PROVIDER_SITE_OTHER): Payer: BC Managed Care – PPO | Admitting: Family Medicine

## 2024-01-05 ENCOUNTER — Encounter (INDEPENDENT_AMBULATORY_CARE_PROVIDER_SITE_OTHER): Payer: Self-pay | Admitting: Family Medicine

## 2024-01-05 DIAGNOSIS — R7303 Prediabetes: Secondary | ICD-10-CM

## 2024-01-05 DIAGNOSIS — Z6841 Body Mass Index (BMI) 40.0 and over, adult: Secondary | ICD-10-CM

## 2024-01-05 DIAGNOSIS — E669 Obesity, unspecified: Secondary | ICD-10-CM | POA: Diagnosis not present

## 2024-01-05 DIAGNOSIS — I10 Essential (primary) hypertension: Secondary | ICD-10-CM | POA: Diagnosis not present

## 2024-01-05 DIAGNOSIS — Z7984 Long term (current) use of oral hypoglycemic drugs: Secondary | ICD-10-CM

## 2024-01-05 DIAGNOSIS — E119 Type 2 diabetes mellitus without complications: Secondary | ICD-10-CM | POA: Diagnosis not present

## 2024-01-05 MED ORDER — PHENTERMINE HCL 15 MG PO CAPS: 15.0000 mg | ORAL_CAPSULE | ORAL | 0 refills | Status: DC

## 2024-01-05 NOTE — Progress Notes (Signed)
 Office: [phone]  /  Fax: [phone]  WEIGHT SUMMARY AND BIOMETRICS  Anthropometric Measurements Height: 5\' 8"  (1.727 m) Weight: 282 lb (127.9 kg) BMI (Calculated): 42.89 Weight at Last Visit: 280 lb Weight Lost Since Last Visit: 0 lb Weight Gained Since Last Visit: 2 lb Starting Weight: 378 lb Total Weight Loss (lbs): 96 lb (43.5 kg) Peak Weight: 407 lb   Body Composition  Body Fat %: 50.5 % Fat Mass (lbs): 142.6 lbs Muscle Mass (lbs): 132.8 lbs Total Body Water (lbs): 102.4 lbs Visceral Fat Rating : 17   Other Clinical Data Fasting: No Labs: No Today's Visit #: 48 Starting Date: 12/27/20    Chief Complaint: OBESITY   History of Present Illness   The patient presents for obesity treatment plan discussion and progress monitoring.  She is focused on managing obesity through a structured treatment plan. Despite adhering to her dietary and exercise regimen, she has gained two pounds since her last visit. She is actively working on sustainable dietary changes and exploring various protein sources to maintain variety and prevent burnout.  Her dietary goals include consuming between 1500 and 1800 calories per day with a focus on over 100 grams of protein. She meets these goals about 90% of the time but finds it challenging to stay within the calorie range while meeting her protein targets. She incorporates more dairy, such as low-fat cottage cheese and fat-free Fairlife milk, to increase protein intake and experiments with recipes to maintain dietary variety.  Her exercise routine consists of a combination of cardio and strengthening exercises for 60 minutes, six times per week. She has returned to participating in [employer] classes three times a week, although she previously experienced knee pain from overexertion.  She is currently taking phentermine and amlodipine. She recently ran out of amlodipine, which led to a temporary increase in her blood pressure, but  she has since refilled her prescription. She is also taking metformin, which is ready for pickup at her pharmacy.  She recently completed physical therapy, which provided her with a structured 16-week exercise plan.          PHYSICAL EXAM:  Blood pressure 137/85, pulse 88, temperature 98.3 F (36.8 C), height 5\' 8"  (1.727 m), weight 282 lb (127.9 kg), SpO2 100%. Body mass index is 42.88 kg/m.  DIAGNOSTIC DATA REVIEWED:  BMET    Component Value Date/Time   NA 142 10/13/2023 0946   K 4.4 10/13/2023 0946   CL 104 10/13/2023 0946   CO2 23 10/13/2023 0946   GLUCOSE 92 10/13/2023 0946   GLUCOSE 98 07/10/2023 1524   BUN 15 10/13/2023 0946   CREATININE 0.66 10/13/2023 0946   CALCIUM 9.0 10/13/2023 0946   GFRNONAA >60 07/10/2023 1524   GFRAA 128 02/06/2021 0000   Lab Results  Component Value Date   HGBA1C 5.6 10/13/2023   HGBA1C 6.0 (H) 12/27/2020   Lab Results  Component Value Date   INSULIN 9.6 10/13/2023   INSULIN 20.4 12/27/2020   Lab Results  Component Value Date   TSH 0.83 10/13/2023   CBC    Component Value Date/Time   WBC 8.4 09/30/2023 0915   RBC 4.75 09/30/2023 0915   HGB 13.9 09/30/2023 0915   HGB 13.7 12/27/2020 0848   HCT 42.4 09/30/2023 0915   HCT 41.2 12/27/2020 0848   PLT 360.0 09/30/2023 0915   PLT 305 12/27/2020 0848   MCV 89.2 09/30/2023 0915   MCV 87 12/27/2020 0848   MCH 28.1 07/10/2023 1524  MCHC 32.8 09/30/2023 0915   RDW 13.5 09/30/2023 0915   RDW 13.2 12/27/2020 0848   Iron Studies    Component Value Date/Time   FERRITIN 88 02/06/2021 0000   Lipid Panel     Component Value Date/Time   CHOL 147 10/13/2023 0946   TRIG 64 10/13/2023 0946   HDL 54 10/13/2023 0946   CHOLHDL 3 02/04/2023 0715   VLDL 10.6 02/04/2023 0715   LDLCALC 80 10/13/2023 0946   Hepatic Function Panel     Component Value Date/Time   PROT 7.2 10/13/2023 0946   ALBUMIN 4.1 10/13/2023 0946   AST 13 10/13/2023 0946   ALT 9 10/13/2023 0946   ALKPHOS 112  10/13/2023 0946   BILITOT 0.4 10/13/2023 0946      Component Value Date/Time   TSH 0.83 10/13/2023 1114   Nutritional Lab Results  Component Value Date   VD25OH 30.4 10/13/2023   VD25OH 41.3 08/12/2022   VD25OH 46.7 02/06/2022     Assessment and Plan    Obesity Patient is working on [employer] management through dietary changes and exercise. She journals her food intake, aiming for 1500-1800 calories and 100 grams of protein daily, and exercises 60 minutes, six times per week. Despite these efforts, she has gained two pounds in the last month, attributed to muscle gain. Discussed increasing phentermine dose to 30 mg to aid in weight loss, considering her feeling of being stuck. Risks of phentermine include increased blood pressure and potential dependency. Benefits include enhanced weight loss and appetite suppression. Alternatives include continuing current dose or exploring other weight loss medications. - Continue high-protein, low-calorie diet - Increase phentermine dose to 30 mg - Follow-up in four weeks for fasting blood work  Hypertension Blood pressure was high at the last visit due to a lapse in taking amlodipine. She has since resumed the medication. Discussed the importance of consistent medication adherence. - Prescribe amlodipine for 90 days - Monitor blood pressure regularly  Type 2 Diabetes Mellitus Patient is on metformin for diabetes management. She received a notification that her prescription is ready for pickup. - [facility] Maintenance Discussed the importance of maintaining a balanced diet without over-reliance on protein shakes and bars. - Encourage adherence to high-protein, low-calorie diet - Promote regular physical activity - Maintain a balanced diet without over-reliance on protein shakes and bars  Follow-up - Schedule follow-up appointment in four weeks - Ensure fasting blood work  is completed at the next visit.       She was informed of the importance of frequent follow up visits to maximize her success with intensive lifestyle modifications for her multiple health conditions.    Quillian Quince, MD

## 2024-01-06 ENCOUNTER — Other Ambulatory Visit (INDEPENDENT_AMBULATORY_CARE_PROVIDER_SITE_OTHER): Payer: Self-pay

## 2024-01-06 DIAGNOSIS — I10 Essential (primary) hypertension: Secondary | ICD-10-CM

## 2024-01-06 MED ORDER — AMLODIPINE BESYLATE 5 MG PO TABS: 5.0000 mg | ORAL_TABLET | Freq: Every day | ORAL | 0 refills | Status: DC

## 2024-01-06 NOTE — Addendum Note (Signed)
 Addended by: Teressa Senter on: 01/06/2024 05:12 PM   Modules accepted: Orders

## 2024-01-06 NOTE — Telephone Encounter (Signed)
 Patient was here yesterday, she is reaching out for a refill.  Thank you.

## 2024-01-30 ENCOUNTER — Other Ambulatory Visit (INDEPENDENT_AMBULATORY_CARE_PROVIDER_SITE_OTHER): Payer: Self-pay | Admitting: Family Medicine

## 2024-01-30 DIAGNOSIS — I10 Essential (primary) hypertension: Secondary | ICD-10-CM

## 2024-02-01 ENCOUNTER — Other Ambulatory Visit: Payer: Self-pay | Admitting: Medical

## 2024-02-02 ENCOUNTER — Encounter (INDEPENDENT_AMBULATORY_CARE_PROVIDER_SITE_OTHER): Payer: Self-pay | Admitting: Family Medicine

## 2024-02-02 ENCOUNTER — Ambulatory Visit (INDEPENDENT_AMBULATORY_CARE_PROVIDER_SITE_OTHER): Payer: Self-pay | Admitting: Family Medicine

## 2024-02-02 VITALS — BP 129/74 | HR 72 | Temp 98.1°F | Ht 68.0 in | Wt 286.0 lb

## 2024-02-02 DIAGNOSIS — E039 Hypothyroidism, unspecified: Secondary | ICD-10-CM | POA: Diagnosis not present

## 2024-02-02 DIAGNOSIS — E7800 Pure hypercholesterolemia, unspecified: Secondary | ICD-10-CM | POA: Diagnosis not present

## 2024-02-02 DIAGNOSIS — E559 Vitamin D deficiency, unspecified: Secondary | ICD-10-CM | POA: Diagnosis not present

## 2024-02-02 DIAGNOSIS — I10 Essential (primary) hypertension: Secondary | ICD-10-CM | POA: Diagnosis not present

## 2024-02-02 DIAGNOSIS — R7303 Prediabetes: Secondary | ICD-10-CM

## 2024-02-02 DIAGNOSIS — E785 Hyperlipidemia, unspecified: Secondary | ICD-10-CM

## 2024-02-02 DIAGNOSIS — Z6841 Body Mass Index (BMI) 40.0 and over, adult: Secondary | ICD-10-CM

## 2024-02-02 DIAGNOSIS — E669 Obesity, unspecified: Secondary | ICD-10-CM

## 2024-02-02 MED ORDER — METFORMIN HCL 500 MG PO TABS: ORAL_TABLET | ORAL | 0 refills | Status: DC

## 2024-02-02 MED ORDER — AMLODIPINE BESYLATE 5 MG PO TABS: 5.0000 mg | ORAL_TABLET | Freq: Every day | ORAL | 0 refills | Status: DC

## 2024-02-02 NOTE — Progress Notes (Signed)
 Office: [phone]  /  Fax: [phone]  WEIGHT SUMMARY AND BIOMETRICS  Anthropometric Measurements Height: 5\' 8"  (1.727 m) Weight: 286 lb (129.7 kg) BMI (Calculated): 43.5 Weight at Last Visit: 282 LB Weight Lost Since Last Visit: 0 Weight Gained Since Last Visit: 4 lb Starting Weight: 378 LB Total Weight Loss (lbs): 96 lb (43.5 kg) Peak Weight: 407 LB   Body Composition  Body Fat %: 51.8 % Fat Mass (lbs): 148.6 lbs Muscle Mass (lbs): 131.2 lbs Total Body Water (lbs): 104.2 lbs Visceral Fat Rating : 17   Other Clinical Data Fasting: YES Labs: YES Today's Visit #: 51 Starting Date: 12/27/20 Comments: IC ,labs today    Chief Complaint: OBESITY    History of Present Illness The patient presents for obesity treatment.  She is currently fasting for labs and adheres to a high-protein diet plan 95% of the time. Despite this, she has gained four pounds in the last month. She exercises 60 to 90 minutes five times per week and has recently started a new workout regimen post-physical therapy, completing three weeks of this routine.  Her hunger is not well controlled, even with an increased dosage of phentermine, which she feels has not been very effective. She experiences increased hunger after workouts, which sometimes prevents her from sticking to her fasting schedule. Her muscle mass has been maintained, although she has gained one pound of fat. No significant fluid retention is noted as she does not feel bloated or puffy.  She consistently consumes 100 grams of protein daily, with only a few days falling slightly short of this goal. She is exploring different protein sources, including more dairy, to maintain a lower calorie intake.  She has switched from Armour Thyroid to levothyroxine due to cost concerns and is currently on Levoxyl. She wants to ensure her thyroid levels remain stable with this change.  She is planning a vacation to [place] to visit friends and  family, which may disrupt her routine due to limited access to a gym and pool. She plans to walk and be mindful of her eating, especially during a big Easter dinner and other meals out. She intends to manage her protein intake by carrying protein shakes and focusing on high-protein meals.      PHYSICAL EXAM:  Blood pressure 129/74, pulse 72, temperature 98.1 F (36.7 C), height 5\' 8"  (1.727 m), weight 286 lb (129.7 kg), SpO2 97%. Body mass index is 43.49 kg/m.  DIAGNOSTIC DATA REVIEWED:  BMET    Component Value Date/Time   NA 142 10/13/2023 0946   K 4.4 10/13/2023 0946   CL 104 10/13/2023 0946   CO2 23 10/13/2023 0946   GLUCOSE 92 10/13/2023 0946   GLUCOSE 98 07/10/2023 1524   BUN 15 10/13/2023 0946   CREATININE 0.66 10/13/2023 0946   CALCIUM 9.0 10/13/2023 0946   GFRNONAA >60 07/10/2023 1524   GFRAA 128 02/06/2021 0000   Lab Results  Component Value Date   HGBA1C 5.6 10/13/2023   HGBA1C 6.0 (H) 12/27/2020   Lab Results  Component Value Date   INSULIN 9.6 10/13/2023   INSULIN 20.4 12/27/2020   Lab Results  Component Value Date   TSH 0.83 10/13/2023   CBC    Component Value Date/Time   WBC 8.4 09/30/2023 0915   RBC 4.75 09/30/2023 0915   HGB 13.9 09/30/2023 0915   HGB 13.7 12/27/2020 0848   HCT 42.4 09/30/2023 0915   HCT 41.2 12/27/2020 0848   PLT 360.0 09/30/2023 0915  PLT 305 12/27/2020 0848   MCV 89.2 09/30/2023 0915   MCV 87 12/27/2020 0848   MCH 28.1 07/10/2023 1524   MCHC 32.8 09/30/2023 0915   RDW 13.5 09/30/2023 0915   RDW 13.2 12/27/2020 0848   Iron Studies    Component Value Date/Time   FERRITIN 88 02/06/2021 0000   Lipid Panel     Component Value Date/Time   CHOL 147 10/13/2023 0946   TRIG 64 10/13/2023 0946   HDL 54 10/13/2023 0946   CHOLHDL 3 02/04/2023 0715   VLDL 10.6 02/04/2023 0715   LDLCALC 80 10/13/2023 0946   Hepatic Function Panel     Component Value Date/Time   PROT 7.2 10/13/2023 0946   ALBUMIN 4.1 10/13/2023 0946    AST 13 10/13/2023 0946   ALT 9 10/13/2023 0946   ALKPHOS 112 10/13/2023 0946   BILITOT 0.4 10/13/2023 0946      Component Value Date/Time   TSH 0.83 10/13/2023 1114   Nutritional Lab Results  Component Value Date   VD25OH 30.4 10/13/2023   VD25OH 41.3 08/12/2022   VD25OH 46.7 02/06/2022     Assessment and Plan Assessment & Plan Obesity She is experiencing challenges with weight management despite adhering to a high-protein diet 95% of the time and exercising 60-90 minutes five times per week. She has gained four pounds in the last month. The increase in phentermine dosage has not significantly reduced her hunger, likely exacerbated by her extensive exercise routine. Phentermine can lead to tolerance, and a drug holiday may be necessary in the future. Increasing exercise beyond an hour can increase hunger signals, often leading to a desire for more calories than burned. - Increase phentermine to 37.5 mg to manage hunger. - Continue high-protein diet and exercise regimen. - Consider a drug holiday if phentermine becomes ineffective.  Prediabetes She is at risk for prediabetes, and her current lifestyle modifications aim to manage this condition. Monitoring metabolic parameters is planned to assess her risk further. - Order lab tests to assess glucose levels and monitor for prediabetes. -Continue diet and exercise nad weight loss efforts.  Hypothyroidism She is on levothyroxine. The generic versus brand name can affect thyroid medication efficacy. Monitoring of thyroid function is planned. - Order lab tests to assess thyroid function.  Hyperlipidemia Her cholesterol levels will be monitored through lab tests. - Order lab tests to assess cholesterol levels.  -Continue LEAN high-protein diet and exercise regimen.  Vitamin D Deficiency Her vitamin D levels will be monitored through lab tests. - Order lab tests to assess vitamin D levels.  Hypertension Her blood pressure was  slightly elevated initially but normalized upon retesting. No current concerns about her blood pressure.  General Health Maintenance She is actively working on lifestyle modifications, including diet and exercise, to improve her overall health. - Provide a high-protein recipe for chocolate protein pudding.  Follow-up She is scheduled for a follow-up appointment to review lab results and continue monitoring her health conditions. - Schedule follow-up appointment on Mar 01, 2024, at 12:20 PM. - Perform indirect calorimeter test before the next appointment.    She was informed of the importance of frequent follow up visits to maximize her success with intensive lifestyle modifications for her multiple health conditions.    Quillian Quince, MD

## 2024-02-03 LAB — LIPID PANEL WITH LDL/HDL RATIO
Cholesterol, Total: 139 mg/dL (ref 100–199)
HDL: 52 mg/dL (ref >39)
LDL Chol Calc (NIH): 75 mg/dL (ref 0–99)
LDL/HDL Ratio: 1.4 ratio (ref 0.0–3.2)
Triglycerides: 59 mg/dL (ref 0–149)
VLDL Cholesterol Cal: 12 mg/dL (ref 5–40)

## 2024-02-03 LAB — CMP14+EGFR
ALT: 9 IU/L (ref 0–32)
AST: 10 IU/L (ref 0–40)
Albumin: 4 g/dL (ref 3.8–4.9)
Alkaline Phosphatase: 95 IU/L (ref 44–121)
BUN/Creatinine Ratio: 25 — ABNORMAL HIGH (ref 9–23)
BUN: 18 mg/dL (ref 6–24)
Bilirubin Total: 0.5 mg/dL (ref 0.0–1.2)
CO2: 22 mmol/L (ref 20–29)
Calcium: 8.7 mg/dL (ref 8.7–10.2)
Chloride: 105 mmol/L (ref 96–106)
Creatinine, Ser: 0.72 mg/dL (ref 0.57–1.00)
Globulin, Total: 2.6 g/dL (ref 1.5–4.5)
Glucose: 99 mg/dL (ref 70–99)
Potassium: 4.2 mmol/L (ref 3.5–5.2)
Sodium: 140 mmol/L (ref 134–144)
Total Protein: 6.6 g/dL (ref 6.0–8.5)
eGFR: 97 mL/min/{1.73_m2} (ref >59)

## 2024-02-03 LAB — TSH: TSH: 1.06 u[IU]/mL (ref 0.450–4.500)

## 2024-02-03 LAB — VITAMIN B12: Vitamin B-12: 514 pg/mL (ref 232–1245)

## 2024-02-03 LAB — VITAMIN D 25 HYDROXY (VIT D DEFICIENCY, FRACTURES): Vit D, 25-Hydroxy: 36.6 ng/mL (ref 30.0–100.0)

## 2024-02-03 LAB — HEMOGLOBIN A1C
Est. average glucose Bld gHb Est-mCnc: 117 mg/dL
Hgb A1c MFr Bld: 5.7 % — ABNORMAL HIGH (ref 4.8–5.6)

## 2024-02-03 LAB — INSULIN, RANDOM: INSULIN: 11.6 u[IU]/mL (ref 2.6–24.9)

## 2024-02-12 ENCOUNTER — Other Ambulatory Visit: Payer: Self-pay | Admitting: Obstetrics and Gynecology

## 2024-02-12 ENCOUNTER — Ambulatory Visit: Admitting: Obstetrics and Gynecology

## 2024-02-12 DIAGNOSIS — Z1231 Encounter for screening mammogram for malignant neoplasm of breast: Secondary | ICD-10-CM

## 2024-02-25 ENCOUNTER — Encounter: Payer: Self-pay | Admitting: Obstetrics and Gynecology

## 2024-02-25 ENCOUNTER — Ambulatory Visit: Admitting: Obstetrics and Gynecology

## 2024-02-25 ENCOUNTER — Other Ambulatory Visit (HOSPITAL_COMMUNITY)
Admission: RE | Admit: 2024-02-25 | Discharge: 2024-02-25 | Disposition: A | Discharge status: Home / Self Care | Source: Ambulatory Visit | Attending: Obstetrics and Gynecology | Admitting: Obstetrics and Gynecology

## 2024-02-25 VITALS — BP 138/84 | HR 78 | Ht 68.0 in | Wt 287.0 lb

## 2024-02-25 DIAGNOSIS — Z113 Encounter for screening for infections with a predominantly sexual mode of transmission: Secondary | ICD-10-CM | POA: Diagnosis not present

## 2024-02-25 DIAGNOSIS — Z01419 Encounter for gynecological examination (general) (routine) without abnormal findings: Secondary | ICD-10-CM | POA: Diagnosis not present

## 2024-02-25 NOTE — Progress Notes (Signed)
   ANNUAL EXAM Patient name: Laura Knox MRN [id number]  Date of birth: November 23, 1965 Chief Complaint:   Gynecologic Exam (Last pap 2023)  History of Present Illness:   Laura Knox is a 58 y.o. G0P0000 with No LMP recorded. Patient is postmenopausal. being seen today for a routine annual exam.  Current complaints:  Doing well overall. Had an episode of post coital bleeding where she had very light/pink discharge. 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 No other bleeding outside of that episode.  Continues to have hot flashes despite being on max estrogen dose for menopause hormone therapy  Last pap 01/31/22. Results were: NILM w/ HRHPV negative. H/O abnormal pap: yes >10 years ago had LEEP procedure with subsequent paps being normal Last mammogram: 03/12/23. Results were: normal.  Last colonoscopy: 2022. Results were: normal.      01/31/2023    2:47 PM 12/27/2020    7:34 AM  Depression screen PHQ 2/9  Decreased Interest 0 3  Down, Depressed, Hopeless 0 2  PHQ - 2 Score 0 5  Altered sleeping  3  Tired, decreased energy  3  Change in appetite  3  Feeling bad or failure about yourself   3  Trouble concentrating  0  Moving slowly or fidgety/restless  3  Suicidal thoughts  0  PHQ-9 Score  20  Difficult doing work/chores  Not difficult at all         No data to display           Review of Systems:   Pertinent items are noted in HPI Denies any headaches, blurred vision, fatigue, shortness of breath, chest pain, abdominal pain, abnormal vaginal discharge/itching/odor/irritation, problems with periods, bowel movements, urination, or intercourse unless otherwise stated above. Pertinent History Reviewed:  Reviewed past medical,surgical, social and family history.  Reviewed problem list, medications and allergies. Physical Assessment:   Vitals:   02/25/24 1602  BP: 138/84  Pulse: 78  Weight: 287 lb (130.2 kg)  Height: 5\' 8"  (1.727 m)  Body mass index is 43.64 kg/m.         Physical Examination:   General appearance - well appearing, and in no distress  Mental status - alert, oriented to person, place, and time  Chest - respiratory effort normal  Heart - normal peripheral perfusion  Breasts - breasts appear normal, no suspicious masses, no skin or nipple changes or axillary nodes  Abdomen - soft, nontender, nondistended, no masses or organomegaly  Pelvic - VULVA: normal appearing vulva with no masses, tenderness or lesions   Uterus/adnexa non palpable  Chaperone present for exam  No results found for this or any previous visit (from the past 24 hours).  Assessment & Plan:  1) Well-Woman Exam Mammogram: in 1 year, or sooner if problems Colonoscopy:  2032 , or sooner if problems Pap: Due 2026 GC/CT: Collected HIV/HCV: Ordered  Continue menopause hormone therapy - discussed consideration of tapering/stopping in the upcoming years but that she can continue while symptomatic  Orders Placed This Encounter  Procedures   HIV antibody (with reflex)   HCV Ab w Reflex to Quant PCR   Hepatitis B Surface AntiGEN   RPR   Meds: No orders of the defined types were placed in this encounter.  Follow-up: Return in about 1 year (around 02/24/2025) for annual exam with pap.  Izell Marsh, MD 02/27/2024 5:28 PM

## 2024-02-26 DIAGNOSIS — Z113 Encounter for screening for infections with a predominantly sexual mode of transmission: Secondary | ICD-10-CM | POA: Diagnosis not present

## 2024-02-27 LAB — CERVICOVAGINAL ANCILLARY ONLY
Chlamydia: NEGATIVE
Comment: NEGATIVE
Comment: NEGATIVE
Comment: NORMAL
Neisseria Gonorrhea: NEGATIVE
Trichomonas: NEGATIVE

## 2024-02-27 LAB — RPR: RPR Ser Ql: NONREACTIVE

## 2024-02-27 LAB — HIV ANTIBODY (ROUTINE TESTING W REFLEX): HIV Screen 4th Generation wRfx: NONREACTIVE

## 2024-02-27 LAB — HEPATITIS B SURFACE ANTIGEN: Hepatitis B Surface Ag: NEGATIVE

## 2024-02-28 ENCOUNTER — Encounter: Payer: Self-pay | Admitting: Obstetrics and Gynecology

## 2024-03-01 ENCOUNTER — Encounter (INDEPENDENT_AMBULATORY_CARE_PROVIDER_SITE_OTHER): Payer: Self-pay | Admitting: Family Medicine

## 2024-03-01 ENCOUNTER — Ambulatory Visit (INDEPENDENT_AMBULATORY_CARE_PROVIDER_SITE_OTHER): Admitting: Family Medicine

## 2024-03-01 VITALS — BP 117/73 | HR 80 | Temp 97.9°F | Ht 68.0 in | Wt 284.0 lb

## 2024-03-01 DIAGNOSIS — R0602 Shortness of breath: Secondary | ICD-10-CM

## 2024-03-01 DIAGNOSIS — Z6841 Body Mass Index (BMI) 40.0 and over, adult: Secondary | ICD-10-CM

## 2024-03-01 DIAGNOSIS — R632 Polyphagia: Secondary | ICD-10-CM | POA: Insufficient documentation

## 2024-03-01 DIAGNOSIS — I10 Essential (primary) hypertension: Secondary | ICD-10-CM

## 2024-03-01 DIAGNOSIS — E669 Obesity, unspecified: Secondary | ICD-10-CM

## 2024-03-01 DIAGNOSIS — R0609 Other forms of dyspnea: Secondary | ICD-10-CM | POA: Insufficient documentation

## 2024-03-01 MED ORDER — PHENTERMINE HCL 30 MG PO CAPS
30.0000 mg | ORAL_CAPSULE | ORAL | 0 refills | Status: DC
Start: 2024-03-01 — End: 2024-03-31

## 2024-03-01 MED ORDER — AMLODIPINE BESYLATE 5 MG PO TABS: 5.0000 mg | ORAL_TABLET | Freq: Every day | ORAL | 0 refills | Status: DC

## 2024-03-01 NOTE — Progress Notes (Signed)
 Office: [phone]  /  Fax: [phone]  WEIGHT SUMMARY AND BIOMETRICS  Anthropometric Measurements Height: 5\' 8"  (1.727 m) Weight: 284 lb (128.8 kg) BMI (Calculated): 43.19 Weight at Last Visit: 286 lb Weight Lost Since Last Visit: 2 lb Weight Gained Since Last Visit: 0 Starting Weight: 378 lb Total Weight Loss (lbs): 98 lb (44.5 kg) Peak Weight: 407 lb   Body Composition  Body Fat %: 52.4 % Fat Mass (lbs): 149.2 lbs Muscle Mass (lbs): 128.6 lbs Total Body Water  (lbs): 100.2 lbs Visceral Fat Rating : 19   Other Clinical Data RMR: [id number] Fasting: Yes Labs: No Today's Visit #: 50 Starting Date: 12/27/20 Comments: IC DONE TODAY    Chief Complaint: OBESITY   History of Present Illness Laura Knox is a 58 year old female with obesity and hypertension who presents to discuss her obesity treatment plan and assess her progress.  She adheres to a pescatarian diet plan and journals her food intake 95% of the time. She exercises 120 minutes five times a week and has lost two pounds in the last month. She underwent an indirect calorimetry test today, which was distressing due to feelings of claustrophobia. Despite her anxiety during the test, her resting metabolic rate has decreased from 2800 to 1800 calories.  Her hypertension is well controlled with amlodipine  5 mg daily. No current shortness of breath with exercise, although she has a history of this symptom.  She does not wear her CPAP mask for sleep apnea due to intolerance, which may be impacting her metabolic issues.  She underwent two surgeries last year, which may have affected her protein levels and metabolism. Her hemoglobin was stable at 13.9 g/dL during the last check five months ago.  She is currently taking phentermine , which was recently increased to 15 mg. She is unsure if she has started the higher dose as she recently picked up a refill.      PHYSICAL EXAM:  Blood pressure 117/73, pulse 80,  temperature 97.9 F (36.6 C), height 5\' 8"  (1.727 m), weight 284 lb (128.8 kg), SpO2 99%. Body mass index is 43.18 kg/m.  DIAGNOSTIC DATA REVIEWED:  BMET    Component Value Date/Time   NA 140 02/02/2024 0917   K 4.2 02/02/2024 0917   CL 105 02/02/2024 0917   CO2 22 02/02/2024 0917   GLUCOSE 99 02/02/2024 0917   GLUCOSE 98 07/10/2023 1524   BUN 18 02/02/2024 0917   CREATININE 0.72 02/02/2024 0917   CALCIUM 8.7 02/02/2024 0917   GFRNONAA >60 07/10/2023 1524   GFRAA 128 02/06/2021 0000   Lab Results  Component Value Date   HGBA1C 5.7 (H) 02/02/2024   HGBA1C 6.0 (H) 12/27/2020   Lab Results  Component Value Date   INSULIN  11.6 02/02/2024   INSULIN  20.4 12/27/2020   Lab Results  Component Value Date   TSH 1.060 02/02/2024   CBC    Component Value Date/Time   WBC 8.4 09/30/2023 0915   RBC 4.75 09/30/2023 0915   HGB 13.9 09/30/2023 0915   HGB 13.7 12/27/2020 0848   HCT 42.4 09/30/2023 0915   HCT 41.2 12/27/2020 0848   PLT 360.0 09/30/2023 0915   PLT 305 12/27/2020 0848   MCV 89.2 09/30/2023 0915   MCV 87 12/27/2020 0848   MCH 28.1 07/10/2023 1524   MCHC 32.8 09/30/2023 0915   RDW 13.5 09/30/2023 0915   RDW 13.2 12/27/2020 0848   Iron Studies    Component Value Date/Time   FERRITIN 88  02/06/2021 0000   Lipid Panel     Component Value Date/Time   CHOL 139 02/02/2024 0917   TRIG 59 02/02/2024 0917   HDL 52 02/02/2024 0917   CHOLHDL 3 02/04/2023 0715   VLDL 10.6 02/04/2023 0715   LDLCALC 75 02/02/2024 0917   Hepatic Function Panel     Component Value Date/Time   PROT 6.6 02/02/2024 0917   ALBUMIN 4.0 02/02/2024 0917   AST 10 02/02/2024 0917   ALT 9 02/02/2024 0917   ALKPHOS 95 02/02/2024 0917   BILITOT 0.5 02/02/2024 0917      Component Value Date/Time   TSH 1.060 02/02/2024 0917   Nutritional Lab Results  Component Value Date   VD25OH 36.6 02/02/2024   VD25OH 30.4 10/13/2023   VD25OH 41.3 08/12/2022     Assessment and Plan Assessment  & Plan Obesity She has experienced a significant drop in resting metabolic rate from 2800 to 1800 calories, which is lower than expected for her current weight. This may be contributing to her weight plateau. Potential contributing factors include inadequate protein intake, reliance on protein supplements, and untreated sleep apnea. She is following a pescatarian diet and exercising regularly, but her metabolism is not responding as expected. Surgery from last year may also be a factor due to increased protein needs for healing. - Adjust nutrition plan to reduce caloric intake to 1600 calories and at least 100 gm of protein daily. - Increase phentermine  to 30 mg daily to aid in hunger control. - Encourage reduction of protein supplement intake to once or twice a week. - Refer to a sleep specialist to reassess sleep apnea status and potential need for treatment.  Polyphagia She experiences increased hunger, which may be impacting her ability to maintain a reduced calorie diet. - Increase phentermine  to 30 mg daily to aid in hunger control.  Shortness of breath with exertion She reports shortness of breath with exercise, possibly related to obesity and potential sleep apnea. - Continue current exercise regimen to improve cardiovascular fitness.  Hypertension, controlled Hypertension is well controlled with current medication regimen. Blood pressure is 117/73 mmHg. - Continue amlodipine  5 mg daily. - Refill prescription for amlodipine .      She was informed of the importance of frequent follow up visits to maximize her success with intensive lifestyle modifications for her multiple health conditions.    Jasmine Mesi, MD

## 2024-03-04 ENCOUNTER — Encounter (INDEPENDENT_AMBULATORY_CARE_PROVIDER_SITE_OTHER): Payer: Self-pay | Admitting: Family Medicine

## 2024-03-04 ENCOUNTER — Encounter: Payer: Self-pay | Admitting: Medical

## 2024-03-07 ENCOUNTER — Other Ambulatory Visit (INDEPENDENT_AMBULATORY_CARE_PROVIDER_SITE_OTHER): Payer: Self-pay | Admitting: Family Medicine

## 2024-03-07 DIAGNOSIS — R7303 Prediabetes: Secondary | ICD-10-CM

## 2024-03-08 ENCOUNTER — Other Ambulatory Visit: Payer: Self-pay | Admitting: Family

## 2024-03-08 DIAGNOSIS — R0683 Snoring: Secondary | ICD-10-CM

## 2024-03-08 DIAGNOSIS — G4733 Obstructive sleep apnea (adult) (pediatric): Secondary | ICD-10-CM

## 2024-03-11 ENCOUNTER — Ambulatory Visit
Admission: EM | Admit: 2024-03-11 | Discharge: 2024-03-11 | Disposition: A | Discharge status: Home / Self Care | Attending: Family Medicine | Admitting: Family Medicine

## 2024-03-11 DIAGNOSIS — J0191 Acute recurrent sinusitis, unspecified: Secondary | ICD-10-CM | POA: Diagnosis not present

## 2024-03-11 MED ORDER — AMOXICILLIN 875 MG PO TABS: 875.0000 mg | ORAL_TABLET | Freq: Two times a day (BID) | ORAL | 0 refills | Status: AC

## 2024-03-11 NOTE — Discharge Instructions (Signed)
 Make sure you are drinking lots of fluids Consider salt water /saline nasal rinses Continue over-the-counter cough or cold medicines as needed Take amoxicillin  2 times a day See your doctor if not improving by next week

## 2024-03-11 NOTE — ED Provider Notes (Signed)
 [facility]    CSN: [id number] Arrival date & time: 03/11/24  1200      History   Chief Complaint Chief Complaint  Patient presents with   Cough   Nasal Congestion   Sore Throat    HPI Laura Knox is a 58 y.o. female.   HPI Patient is prone to recurring sinusitis.  She does not feel she ever gets better unless she takes amoxicillin .  She states that she has tried this time to drink fluids, take over-the-counter medicines, and give it time however she woke up this morning with a lot of thick unpleasant mucus some sore throat in addition to her sinus and head congestion, postnasal drip, and facial pressure.  She feels she has a sinus infection. No known exposure to illness No recent travel She does not feel she has any flu or COVID symptoms Past Medical History:  Diagnosis Date   B12 deficiency    Bilateral swelling of feet and ankles    Complication of anesthesia    woke up feeling sore head to toe.  Did not have any issuses with anesthesia in January 2024.   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   Depression 12/24/2022   BMI 40.0-44.9, adult (HCC) 12/24/2022   Obesity, Beginning BMI 61.01 12/24/2022   Status post total left knee replacement 11/22/2022   Primary osteoarthritis of right knee 11/21/2022   Left knee pain 10/14/2022   Achilles tendinitis 03/21/2022   Pre-diabetes 02/27/2022   Vitamin D  deficiency 02/27/2022   At risk for diabetes mellitus  02/27/2022   Plantar fasciitis of right foot 01/08/2022   Special screening for malignant neoplasms, colon    Rectal polyp    Essential hypertension 01/29/2021   Class 3 severe obesity with serious comorbidity and body mass index (BMI) of 60.0 to 69.9 in adult 01/29/2021   Decreased functional activity tolerance 07/24/2020   Cervical spondylosis 07/24/2020   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   Obstructive sleep apnea syndrome 07/20/2015   Arthritis 01/06/2014    Past Surgical History:  Procedure Laterality Date   COLONOSCOPY WITH PROPOFOL  N/A 04/10/2021   Procedure: COLONOSCOPY WITH PROPOFOL ;  Surgeon: Lajuan Pila, MD;  Location: [facility];  Service: Endoscopy;  Laterality: N/A;   JOINT REPLACEMENT     KIDNEY STONE SURGERY     miniscus repair Left    POLYPECTOMY  04/10/2021   Procedure: POLYPECTOMY;  Surgeon: Lajuan Pila, MD;  Location: [facility];  Service: Endoscopy;;   SEPTOPLASTY     TOTAL KNEE ARTHROPLASTY Left 11/22/2022   Procedure: LEFT TOTAL KNEE REPLACEMENT;  Surgeon: Wes Hamman, MD;  Location: [facility];  Service: Orthopedics;  Laterality: Left;   TOTAL KNEE ARTHROPLASTY Right 07/21/2023   Procedure: RIGHT TOTAL KNEE REPLACEMENT;  Surgeon: Wes Hamman, MD;  Location: [facility];  Service: Orthopedics;  Laterality:  Right;    OB History     Gravida  0   Para  0   Term  0   Preterm  0   AB  0   Living  0      SAB  0   IAB  0   Ectopic  0   Multiple  0   Live Births  0            Home Medications    Prior to Admission medications   Medication Sig Start Date End Date Taking? Authorizing Provider  amoxicillin  (AMOXIL ) 875 MG tablet Take 1 tablet (875 mg total) by mouth 2 (two) times daily for 10 days. 03/11/24 03/21/24 Yes Stephany Ehrich, MD  amLODipine  (NORVASC ) 5 MG tablet Take 1 tablet (5 mg total) by mouth daily. 03/01/24   Jasmine Mesi D, MD  Ascorbic Acid (VITAMIN C) 1000 MG tablet Take 1,000 mg by mouth daily.    [provider]   cetirizine (ZYRTEC) 10 MG tablet Take 10 mg by mouth daily.    [provider]  estradiol  (VIVELLE -DOT) 0.1 MG/24HR patch PLACE 1 PATCH (0.1 MG TOTAL) ONTO THE SKIN 2 (TWO) TIMES A WEEK. 02/02/24   Saguier, Gaylin Ke, PA-C  levothyroxine  (SYNTHROID ) 100 MCG tablet TAKE 1 TABLET BY MOUTH EVERY DAY 11/10/23   Saguier, Gaylin Ke, PA-C  losartan  (COZAAR ) 100 MG tablet TAKE 1 TABLET BY MOUTH EVERYDAY AT BEDTIME 10/21/23   Saguier, Gaylin Ke, PA-C  metFORMIN  (GLUCOPHAGE ) 500 MG tablet 2 po with lunch qd 02/02/24   Beasley, Caren D, MD  Multiple Vitamins-Minerals (HAIR/SKIN/NAILS/BIOTIN PO) Take 2 tablets by mouth daily.    [provider]  Multiple Vitamins-Minerals (MULTIVITAMIN ADULT) CHEW Chew 2 each by mouth daily.    [provider]  phentermine  30 MG capsule Take 1 capsule (30 mg total) by mouth every morning. 03/01/24   Jasmine Mesi D, MD  polyethylene glycol powder (GLYCOLAX Austin Blumenthal ) 17 GM/SCOOP powder Take 1 Container by mouth daily. Patient not taking: Reported on 03/01/2024    [provider]  progesterone  (PROMETRIUM ) 100 MG capsule Take 1 capsule (100 mg total) by mouth at bedtime. 04/18/23   Lacey Pian, MD  traMADol  (ULTRAM ) 50 MG tablet Take 1 tablet (50 mg total) by mouth 3 (three) times daily as needed. Patient not taking: Reported on 03/01/2024 09/02/23   Sandie Cross, PA-C    Family History Family History  Problem Relation Age of Onset   Healthy Mother    Obesity Mother    Cancer Father    Obesity Father    Cancer - Other Father    Stroke Maternal Grandmother    Lung cancer Maternal Grandfather     Social History Social History   Tobacco Use   Smoking status: Never   Smokeless tobacco: Never  Vaping Use   Vaping status: Never Used  Substance Use Topics   Alcohol use: Not Currently   Drug use: Never     Allergies   Chlorthalidone    Review of Systems Review of Systems  See HPI Physical Exam Triage Vital Signs ED Triage Vitals  [03/11/24 1214]  Encounter Vitals Group     BP (!) 145/94     Systolic BP Percentile      Diastolic BP Percentile      Pulse Rate 78     Resp 17     Temp 98.7 F (37.1 C)     Temp Source Oral     SpO2 96 %  Weight      Height      Head Circumference      Peak Flow      Pain Score 3     Pain Loc      Pain Education      Exclude from Growth Chart    No data found.  Updated Vital Signs BP (!) 145/94 (BP Location: Right Arm)   Pulse 78   Temp 98.7 F (37.1 C) (Oral)   Resp 17   SpO2 96%      Physical Exam Vitals reviewed.  Constitutional:      General: She is not in acute distress.    Appearance: She is well-developed. She is obese. She is not ill-appearing.  HENT:     Head: Normocephalic and atraumatic.     Right Ear: Tympanic membrane normal.     Left Ear: Tympanic membrane normal.     Nose: Congestion and rhinorrhea present.     Mouth/Throat:     Pharynx: Posterior oropharyngeal erythema present.  Eyes:     Conjunctiva/sclera: Conjunctivae normal.     Pupils: Pupils are equal, round, and reactive to light.  Cardiovascular:     Rate and Rhythm: Normal rate and regular rhythm.     Heart sounds: Normal heart sounds.  Pulmonary:     Effort: Pulmonary effort is normal. No respiratory distress.     Breath sounds: Normal breath sounds.  Musculoskeletal:        General: Normal range of motion.     Cervical back: Normal range of motion.  Lymphadenopathy:     Cervical: No cervical adenopathy.  Skin:    General: Skin is warm and dry.  Neurological:     Mental Status: She is alert.      UC Treatments / Results  Labs (all labs ordered are listed, but only abnormal results are displayed) Labs Reviewed - No data to display  EKG   Radiology No results found.  Procedures Procedures (including critical care time)  Medications Ordered in UC Medications - No data to display  Initial Impression / Assessment and Plan / UC Course  I have reviewed the  triage vital signs and the nursing notes.  Pertinent labs & imaging results that were available during my care of the patient were reviewed by me and considered in my medical decision making (see chart for details).    Reviewed again with patient that early sinus symptoms are usually caused by a virus, that antibiotics are not not indicated until infection has reached 7 to 10 days.  Even with thick yellow-green sputum, this is still likely a virus.  Patient is given amoxicillin  because of her firm belief that is not necessary for improvement Final Clinical Impressions(s) / UC Diagnoses   Final diagnoses:  Acute recurrent sinusitis, unspecified location   Discharge Instructions      Make sure you are drinking lots of fluids Consider salt water /saline nasal rinses Continue over-the-counter cough or cold medicines as needed Take amoxicillin  2 times a day See your doctor if not improving by next week  ED Prescriptions     Medication Sig Dispense Auth. Provider   amoxicillin  (AMOXIL ) 875 MG tablet Take 1 tablet (875 mg total) by mouth 2 (two) times daily for 10 days. 20 tablet Stephany Ehrich, MD      PDMP not reviewed this encounter.   Stephany Ehrich, MD 03/11/24 1235

## 2024-03-11 NOTE — ED Triage Notes (Signed)
 Pt c/o cough, congestion and sore throat since Sat. No known fever. Post nasal drainage. Taking tylenol  and Afrin prn. Hx of bronchitis and sinus infections.

## 2024-03-18 ENCOUNTER — Ambulatory Visit (INDEPENDENT_AMBULATORY_CARE_PROVIDER_SITE_OTHER)

## 2024-03-18 DIAGNOSIS — Z1231 Encounter for screening mammogram for malignant neoplasm of breast: Secondary | ICD-10-CM | POA: Diagnosis not present

## 2024-03-24 ENCOUNTER — Ambulatory Visit: Payer: Self-pay | Admitting: Obstetrics and Gynecology

## 2024-03-31 ENCOUNTER — Encounter (INDEPENDENT_AMBULATORY_CARE_PROVIDER_SITE_OTHER): Payer: Self-pay | Admitting: Family Medicine

## 2024-03-31 ENCOUNTER — Ambulatory Visit (INDEPENDENT_AMBULATORY_CARE_PROVIDER_SITE_OTHER): Admitting: Family Medicine

## 2024-03-31 DIAGNOSIS — E669 Obesity, unspecified: Secondary | ICD-10-CM | POA: Diagnosis not present

## 2024-03-31 DIAGNOSIS — R7303 Prediabetes: Secondary | ICD-10-CM | POA: Diagnosis not present

## 2024-03-31 DIAGNOSIS — R632 Polyphagia: Secondary | ICD-10-CM

## 2024-03-31 DIAGNOSIS — Z6841 Body Mass Index (BMI) 40.0 and over, adult: Secondary | ICD-10-CM

## 2024-03-31 DIAGNOSIS — I10 Essential (primary) hypertension: Secondary | ICD-10-CM

## 2024-03-31 MED ORDER — METFORMIN HCL 500 MG PO TABS: ORAL_TABLET | ORAL | 0 refills | Status: DC

## 2024-03-31 MED ORDER — AMLODIPINE BESYLATE 5 MG PO TABS
5.0000 mg | ORAL_TABLET | Freq: Every day | ORAL | 0 refills | Status: DC
Start: 2024-03-31 — End: 2024-04-27

## 2024-03-31 MED ORDER — PHENTERMINE HCL 30 MG PO CAPS: 30.0000 mg | ORAL_CAPSULE | ORAL | 0 refills | Status: DC

## 2024-03-31 NOTE — Progress Notes (Signed)
 Office: [phone]  /  Fax: [phone]  WEIGHT SUMMARY AND BIOMETRICS  Anthropometric Measurements Height: 5\' 8"  (1.727 m) Weight: 282 lb (127.9 kg) BMI (Calculated): 42.89 Weight at Last Visit: 284 lb Weight Lost Since Last Visit: 2 lb Weight Gained Since Last Visit: 0 Starting Weight: 378 lb Total Weight Loss (lbs): 96 lb (43.5 kg) Peak Weight: 407 lb   Body Composition  Body Fat %: 52.3 % Fat Mass (lbs): 147.8 lbs Muscle Mass (lbs): 128 lbs Total Body Water  (lbs): 99.2 lbs Visceral Fat Rating : 17   Other Clinical Data Fasting: yes Labs: no Today's Visit #: 51 Starting Date: 12/27/20    Chief Complaint: OBESITY   History of Present Illness Keith Cancio is a 58 year old female with obesity, prediabetes, and hypertension who presents for obesity treatment plan assessment and progress evaluation.  She is adhering to a pescetarian diet, journaling her intake with a calorie goal of 1500 to 1800 calories, and aiming for 100 or more grams of protein. She maintains this regimen 90 to 95% of the time. She engages in regular physical activity, including gym workouts and swimming for about two hours, five times per week, resulting in a two-pound weight loss over the past month.  She has a history of hypertension, currently managed with amlodipine  5 mg, achieving well-controlled blood pressure. She is also focusing on weight loss and exercise to further improve her blood pressure.  For prediabetes, she is on metformin  and has been reducing simple carbohydrates in her diet while increasing her exercise.  She is taking phentermine  for polyphagia and requests a refill. She is managing her hunger better and is exploring new protein recipes, particularly for desserts, to help with sugar cravings.  She experiences significant foot pain localized to one spot on the top of her foot, which affects her ability to participate in Silver Sneakers. The chronic pain impacts her  exercise routine, especially activities involving movement and twisting. She has not yet sought medical attention for this issue.  She deals with fatigue and prefers morning exercise when she feels more energetic.      PHYSICAL EXAM:  Blood pressure 112/74, pulse 69, temperature 98.2 F (36.8 C), height 5\' 8"  (1.727 m), weight 282 lb (127.9 kg), SpO2 100%. Body mass index is 42.88 kg/m.  DIAGNOSTIC DATA REVIEWED:  BMET    Component Value Date/Time   NA 140 02/02/2024 0917   K 4.2 02/02/2024 0917   CL 105 02/02/2024 0917   CO2 22 02/02/2024 0917   GLUCOSE 99 02/02/2024 0917   GLUCOSE 98 07/10/2023 1524   BUN 18 02/02/2024 0917   CREATININE 0.72 02/02/2024 0917   CALCIUM 8.7 02/02/2024 0917   GFRNONAA >60 07/10/2023 1524   GFRAA 128 02/06/2021 0000   Lab Results  Component Value Date   HGBA1C 5.7 (H) 02/02/2024   HGBA1C 6.0 (H) 12/27/2020   Lab Results  Component Value Date   INSULIN  11.6 02/02/2024   INSULIN  20.4 12/27/2020   Lab Results  Component Value Date   TSH 1.060 02/02/2024   CBC    Component Value Date/Time   WBC 8.4 09/30/2023 0915   RBC 4.75 09/30/2023 0915   HGB 13.9 09/30/2023 0915   HGB 13.7 12/27/2020 0848   HCT 42.4 09/30/2023 0915   HCT 41.2 12/27/2020 0848   PLT 360.0 09/30/2023 0915   PLT 305 12/27/2020 0848   MCV 89.2 09/30/2023 0915   MCV 87 12/27/2020 0848   MCH 28.1 07/10/2023  1524   MCHC 32.8 09/30/2023 0915   RDW 13.5 09/30/2023 0915   RDW 13.2 12/27/2020 0848   Iron Studies    Component Value Date/Time   FERRITIN 88 02/06/2021 0000   Lipid Panel     Component Value Date/Time   CHOL 139 02/02/2024 0917   TRIG 59 02/02/2024 0917   HDL 52 02/02/2024 0917   CHOLHDL 3 02/04/2023 0715   VLDL 10.6 02/04/2023 0715   LDLCALC 75 02/02/2024 0917   Hepatic Function Panel     Component Value Date/Time   PROT 6.6 02/02/2024 0917   ALBUMIN 4.0 02/02/2024 0917   AST 10 02/02/2024 0917   ALT 9 02/02/2024 0917   ALKPHOS 95  02/02/2024 0917   BILITOT 0.5 02/02/2024 0917      Component Value Date/Time   TSH 1.060 02/02/2024 0917   Nutritional Lab Results  Component Value Date   VD25OH 36.6 02/02/2024   VD25OH 30.4 10/13/2023   VD25OH 41.3 08/12/2022     Assessment and Plan Assessment & Plan Obesity She is actively working on weight loss through a pescetarian diet and calorie control (1500-1800 calories with over 100 grams of protein). She exercises regularly, swimming and gym workouts for about two hours, five times a week. She has lost two pounds in the last month. She is experiencing chronic foot pain, localized to one spot on the top of her foot, possibly a bone spur, affecting her ability to participate in certain exercises like Silver Sneakers. - Continue pescetarian diet and calorie control - Continue regular exercise regimen - Refer for foot pain evaluation and possible x-ray - Consider local injections for foot pain if indicated - Refill phentermine  for polyphagia  Hypertension Hypertension is well controlled with a current blood pressure reading of 112/74 mmHg. She is on amlodipine  5 mg and is also working on weight loss and exercise to further improve blood pressure control. - Refill amlodipine  5 mg - Continue weight loss and exercise regimen  Prediabetes She is on metformin  and is actively working on decreasing simple carbohydrates in her diet and increasing exercise. - Refill metformin  - Continue dietary modifications and exercise    She was informed of the importance of frequent follow up visits to maximize her success with intensive lifestyle modifications for her multiple health conditions.    Jasmine Mesi, MD

## 2024-04-02 ENCOUNTER — Encounter (INDEPENDENT_AMBULATORY_CARE_PROVIDER_SITE_OTHER): Payer: Self-pay | Admitting: Family Medicine

## 2024-04-06 ENCOUNTER — Ambulatory Visit (INDEPENDENT_AMBULATORY_CARE_PROVIDER_SITE_OTHER)

## 2024-04-06 ENCOUNTER — Ambulatory Visit (INDEPENDENT_AMBULATORY_CARE_PROVIDER_SITE_OTHER): Admitting: Family Medicine

## 2024-04-06 VITALS — BP 108/80 | HR 76 | Ht 68.0 in | Wt 293.0 lb

## 2024-04-06 DIAGNOSIS — M79672 Pain in left foot: Secondary | ICD-10-CM | POA: Diagnosis not present

## 2024-04-06 NOTE — Progress Notes (Unsigned)
 Joanna Muck, PhD, LAT, ATC acting as a scribe for Garlan Juniper, MD.  Laura Knox is a 58 y.o. female who presents to [facility] at [facility] today for cont'd L foot pain. Pt was last seen by Dr. Alease Hunter on 04/15/23 for L Great toe pain. Labs were obtained and she was advised to use a turf toe insole or post-op shoe.   Today, pt c/o L foot pain ongoing since Feb-March. She got up in the middle of the night and felt a sharp pain in her foot, when rocking back to get momentum to stand. Pt locates pain to the dorsum of the foot, along the 1st-2nd MT.  Dx testing: 04/15/23 Labs  Pertinent review of systems: No fevers or chills  Relevant historical information: Hypertension   Exam:  BP 108/80   Pulse 76   Ht 5' 8 (1.727 m)   Wt 293 lb (132.9 kg)   SpO2 98%   BMI 44.55 kg/m  General: Well Developed, well nourished, and in no acute distress.   MSK: Left foot: Bruising present along third toe. Mildly tender palpation of the dorsal midfoot.  Normal foot and ankle motion. Intact strength.    Lab and Radiology Results No results found for this or any previous visit (from the past 72 hours). DG Foot Complete Left Result Date: 04/06/2024 CLINICAL DATA:  Left foot pain for few months. Recently stubbed toes on a swimming pool EXAM: LEFT FOOT - COMPLETE 3+ VIEW COMPARISON:  Left great toe radiographs 04/15/2023 FINDINGS: High-grade chronic enthesopathic change along the distal approximate 2.6 cm of the Achilles tendon insertion on the calcaneus. 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 Moderate plantar calcaneal heel spur with multiple additional tiny ossicles overlying the plantar fascia origin. Moderate dorsal navicular-cuneiform and mild dorsal tarsometatarsal degenerative osteophytes on lateral view. No acute fracture or dislocation. IMPRESSION: 1. No acute fracture. 2. High-grade chronic enthesopathic change along the  distal Achilles tendon insertion on the calcaneus. 3. Moderate plantar calcaneal heel spur. 1. Electronically Signed   By: Bertina Broccoli M.D.   On: 04/06/2024 21:25  I, Garlan Juniper, personally (independently) visualized and performed the interpretation of the images attached in this note.      Assessment and Plan: 58 y.o. female with left dorsal midfoot pain due to exacerbation of arthritis or stress reaction.  She is increasing her activity pretty rapidly after knee replacement bilaterally.  Plan for bit of relative rest and arch strap.  She already has really good shoes with good arch support.  We talked about exercises that she can do that she will enjoy.  If not improving consider injection or MRI. She did have a bruised third toe clinically but x-ray does not show any fracture.   PDMP not reviewed this encounter. Orders Placed This Encounter  Procedures   DG Foot Complete Left    Standing Status:   Future    Number of Occurrences:   1    Expiration Date:   04/06/2025    Reason for Exam (SYMPTOM  OR DIAGNOSIS REQUIRED):   left foot pain    Preferred imaging location?:   [facility]    Is patient pregnant?:   No   No orders of the defined types were placed in this encounter.    Discussed warning signs or symptoms. Please see discharge instructions. Patient expresses understanding.   The above documentation has been reviewed and is accurate and complete Garlan Juniper,  M.D.

## 2024-04-06 NOTE — Patient Instructions (Addendum)
 Thank you for coming in today.   Arch strap  Please use Voltaren gel (Generic Diclofenac Gel) up to 4x daily for pain as needed.  This is available over-the-counter as both the name brand Voltaren gel and the generic diclofenac gel.   Reduce activity level   Let me know if not improving

## 2024-04-07 ENCOUNTER — Ambulatory Visit: Payer: Self-pay | Admitting: Family Medicine

## 2024-04-07 NOTE — Progress Notes (Signed)
 Left foot x-ray shows heel spur but no broken bones.  There is arthritis in the midfoot.

## 2024-04-13 ENCOUNTER — Encounter (INDEPENDENT_AMBULATORY_CARE_PROVIDER_SITE_OTHER): Payer: Self-pay | Admitting: Family Medicine

## 2024-04-13 ENCOUNTER — Other Ambulatory Visit (INDEPENDENT_AMBULATORY_CARE_PROVIDER_SITE_OTHER): Payer: Self-pay | Admitting: Family Medicine

## 2024-04-13 DIAGNOSIS — R632 Polyphagia: Secondary | ICD-10-CM

## 2024-04-13 DIAGNOSIS — Z6841 Body Mass Index (BMI) 40.0 and over, adult: Secondary | ICD-10-CM

## 2024-04-13 MED ORDER — PHENTERMINE HCL 37.5 MG PO CAPS: 37.5000 mg | ORAL_CAPSULE | ORAL | 0 refills | Status: DC

## 2024-04-13 NOTE — Telephone Encounter (Signed)
**Note De-identified  [place] Obfuscation** Please advise 

## 2024-04-13 NOTE — Telephone Encounter (Signed)
 Laura Knox, please call in a Rx for phentermine  37.5 qday #30. I have the Rx in Epic, it just needs to be called in.

## 2024-04-14 NOTE — Telephone Encounter (Signed)
 Call to pharmacy.  Spoke with Kingsley Penny, phentermine  called in.

## 2024-04-19 ENCOUNTER — Encounter (INDEPENDENT_AMBULATORY_CARE_PROVIDER_SITE_OTHER): Payer: Self-pay | Admitting: Family Medicine

## 2024-04-27 ENCOUNTER — Encounter (INDEPENDENT_AMBULATORY_CARE_PROVIDER_SITE_OTHER): Payer: Self-pay | Admitting: Family Medicine

## 2024-04-27 ENCOUNTER — Ambulatory Visit (INDEPENDENT_AMBULATORY_CARE_PROVIDER_SITE_OTHER): Admitting: Family Medicine

## 2024-04-27 VITALS — BP 114/76 | HR 93 | Temp 97.7°F | Ht 68.0 in | Wt 283.0 lb

## 2024-04-27 DIAGNOSIS — Z6841 Body Mass Index (BMI) 40.0 and over, adult: Secondary | ICD-10-CM

## 2024-04-27 DIAGNOSIS — E669 Obesity, unspecified: Secondary | ICD-10-CM

## 2024-04-27 DIAGNOSIS — I10 Essential (primary) hypertension: Secondary | ICD-10-CM | POA: Diagnosis not present

## 2024-04-27 DIAGNOSIS — Z0000 Encounter for general adult medical examination without abnormal findings: Secondary | ICD-10-CM

## 2024-04-27 MED ORDER — PHENTERMINE HCL 37.5 MG PO CAPS: 37.5000 mg | ORAL_CAPSULE | ORAL | 0 refills | Status: DC

## 2024-04-27 MED ORDER — AMLODIPINE BESYLATE 5 MG PO TABS: 5.0000 mg | ORAL_TABLET | Freq: Every day | ORAL | 0 refills | Status: DC

## 2024-04-27 NOTE — Progress Notes (Signed)
 Office: [phone]  /  Fax: [phone]  WEIGHT SUMMARY AND BIOMETRICS  Anthropometric Measurements Height: 5' 8 (1.727 m) Weight: 283 lb (128.4 kg) BMI (Calculated): 43.04 Weight at Last Visit: 282 lb Weight Lost Since Last Visit: 0 Weight Gained Since Last Visit: 1 lb Starting Weight: 378 lb Total Weight Loss (lbs): 95 lb (43.1 kg) Peak Weight: 407 lb   Body Composition  Body Fat %: 50.1 % Fat Mass (lbs): 142.2 lbs Muscle Mass (lbs): 134.4 lbs Total Body Water  (lbs): 101.2 lbs Visceral Fat Rating : 17   Other Clinical Data Fasting: No Labs: No Today's Visit #: 52 Starting Date: 12/27/20    Chief Complaint: OBESITY    History of Present Illness Laura Knox is a 58 year old female with obesity and hypertension who presents to assess her progress with weight management.  She has been following a modified pescetarian eating plan, focusing on monitoring her macronutrients, particularly aiming for 100 grams of protein daily within a 1500 to 1800 calorie range. Despite her efforts, she has gained one pound in the last four weeks. She exercises for two hours, five times a week, combining cardio, strengthening, and aerobic exercises. She has also incorporated [employer]ne[employer] classes once a week and plans to increase the frequency gradually.  She has a history of hypertension and is currently taking amlodipine  5 mg daily and losartan  100 mg daily. She reports no side effects from her medications and requests a refill of amlodipine .  Her dietary habits include incorporating more dairy and experimenting with various recipes to maintain her protein intake. She mentions using cottage cheese and [place] yogurt in her meals and has been exploring metabolism-boosting foods. She is also addressing her sugar cravings, acknowledging them as a form of addiction, and is working on reducing her sugar intake.  She mentions having foot pain and other issues that have affected her  exercise schedule. She is also awaiting an appointment for her sleep apnea, which is scheduled for August. She notes that her sleep quality varies, with some nights being better than others.      PHYSICAL EXAM:  Blood pressure 114/76, pulse 93, temperature 97.7 F (36.5 C), height 5' 8 (1.727 m), weight 283 lb (128.4 kg), SpO2 98%. Body mass index is 43.03 kg/m.  DIAGNOSTIC DATA REVIEWED:  BMET    Component Value Date/Time   NA 140 02/02/2024 0917   K 4.2 02/02/2024 0917   CL 105 02/02/2024 0917   CO2 22 02/02/2024 0917   GLUCOSE 99 02/02/2024 0917   GLUCOSE 98 07/10/2023 1524   BUN 18 02/02/2024 0917   CREATININE 0.72 02/02/2024 0917   CALCIUM 8.7 02/02/2024 0917   GFRNONAA >60 07/10/2023 1524   GFRAA 128 02/06/2021 0000   Lab Results  Component Value Date   HGBA1C 5.7 (H) 02/02/2024   HGBA1C 6.0 (H) 12/27/2020   Lab Results  Component Value Date   INSULIN  11.6 02/02/2024   INSULIN  20.4 12/27/2020   Lab Results  Component Value Date   TSH 1.060 02/02/2024   CBC    Component Value Date/Time   WBC 8.4 09/30/2023 0915   RBC 4.75 09/30/2023 0915   HGB 13.9 09/30/2023 0915   HGB 13.7 12/27/2020 0848   HCT 42.4 09/30/2023 0915   HCT 41.2 12/27/2020 0848   PLT 360.0 09/30/2023 0915   PLT 305 12/27/2020 0848   MCV 89.2 09/30/2023 0915   MCV 87 12/27/2020 0848   MCH 28.1 07/10/2023 1524   MCHC  32.8 09/30/2023 0915   RDW 13.5 09/30/2023 0915   RDW 13.2 12/27/2020 0848   Iron Studies    Component Value Date/Time   FERRITIN 88 02/06/2021 0000   Lipid Panel     Component Value Date/Time   CHOL 139 02/02/2024 0917   TRIG 59 02/02/2024 0917   HDL 52 02/02/2024 0917   CHOLHDL 3 02/04/2023 0715   VLDL 10.6 02/04/2023 0715   LDLCALC 75 02/02/2024 0917   Hepatic Function Panel     Component Value Date/Time   PROT 6.6 02/02/2024 0917   ALBUMIN 4.0 02/02/2024 0917   AST 10 02/02/2024 0917   ALT 9 02/02/2024 0917   ALKPHOS 95 02/02/2024 0917   BILITOT  0.5 02/02/2024 0917      Component Value Date/Time   TSH 1.060 02/02/2024 0917   Nutritional Lab Results  Component Value Date   VD25OH 36.6 02/02/2024   VD25OH 30.4 10/13/2023   VD25OH 41.3 08/12/2022     Assessment and Plan Assessment & Plan Obesity She is on a modified pescetarian diet, focusing on macros and consuming 1500-1800 calories with at least 100 grams of protein daily. Despite a one-pound weight gain in the last four weeks, this is attributed to fluid retention, with a noted decrease in body fat. She engages in regular exercise, including cardio, strengthening, and aerobic activities for two hours, five times a week. Emphasis is on muscle building to boost metabolism, as increased muscle mass is the only proven method to enhance metabolism. She is advised against excessively low caloric intake and is addressing sugar cravings, which she describes as addictive, by reducing sugar intake. - Continue pescetarian diet with focus on macros and protein intake. - Maintain current exercise regimen. - Refill phentermine  prescription.  Hypertension Her hypertension is well-controlled with amlodipine  5 mg daily and losartan  100 mg daily, with no side effects reported. Blood pressure is 114/76 mmHg. - Refill amlodipine  prescription.  General Health Maintenance She is actively engaged in lifestyle modifications, including dietary changes and regular exercise. She is exploring recipes that align with her dietary goals and incorporating more dairy and protein-rich foods. Discussed potential metabolism-boosting foods, noting that increased muscle mass is the only proven method to boost metabolism. Addressed the importance of managing anemia and sleep apnea as they can affect metabolism. - Encourage continued adherence to dietary and exercise regimen. - Keep follow-up for sleep apnea evaluation.  Follow-up She has an upcoming appointment in August for potential sleep apnea evaluation,  which may affect her metabolism. - Ensure follow-up appointment for sleep apnea evaluation in August. - Schedule next follow-up appointment after the current one.    She was informed of the importance of frequent follow up visits to maximize her success with intensive lifestyle modifications for her multiple health conditions.    Louann Penton, MD

## 2024-04-29 ENCOUNTER — Encounter: Payer: Self-pay | Admitting: Medical

## 2024-04-30 ENCOUNTER — Other Ambulatory Visit (INDEPENDENT_AMBULATORY_CARE_PROVIDER_SITE_OTHER): Payer: Self-pay | Admitting: Family Medicine

## 2024-04-30 DIAGNOSIS — R7303 Prediabetes: Secondary | ICD-10-CM

## 2024-05-02 ENCOUNTER — Encounter (INDEPENDENT_AMBULATORY_CARE_PROVIDER_SITE_OTHER): Payer: Self-pay | Admitting: Family Medicine

## 2024-05-02 DIAGNOSIS — R7303 Prediabetes: Secondary | ICD-10-CM

## 2024-05-03 ENCOUNTER — Other Ambulatory Visit (INDEPENDENT_AMBULATORY_CARE_PROVIDER_SITE_OTHER): Payer: Self-pay

## 2024-05-03 DIAGNOSIS — R7303 Prediabetes: Secondary | ICD-10-CM

## 2024-05-03 NOTE — Telephone Encounter (Signed)
 LAST APPOINTMENT DATE: 04/27/2024 NEXT APPOINTMENT DATE: 05/27/2024   [id number] - [place], [place] - [address] [place] [address] Phone: [phone] Fax: [phone]  [facility] - [place], [place] - [address] [place] [address] Phone: [phone] Fax: [phone]  Patient is requesting a refill of the following medications: Requested Prescriptions    No prescriptions requested or ordered in this encounter    Date last filled: 03/31/2024 Previously prescribed by Dr Verdon  Lab Results  Component Value Date   HGBA1C 5.7 (H) 02/02/2024   HGBA1C 5.6 10/13/2023   HGBA1C 5.6 07/10/2023   Lab Results  Component Value Date   LDLCALC 75 02/02/2024   CREATININE 0.72 02/02/2024   Lab Results  Component Value Date   VD25OH 36.6 02/02/2024   VD25OH 30.4 10/13/2023   VD25OH 41.3 08/12/2022    BP Readings from Last 3 Encounters:  04/27/24 114/76  04/06/24 108/80  03/31/24 112/74

## 2024-05-05 MED ORDER — METFORMIN HCL 500 MG PO TABS: ORAL_TABLET | ORAL | 0 refills | Status: DC

## 2024-05-05 NOTE — Telephone Encounter (Signed)
 Ok to rf?

## 2024-05-07 ENCOUNTER — Other Ambulatory Visit: Payer: Self-pay | Admitting: Medical

## 2024-05-08 ENCOUNTER — Other Ambulatory Visit: Payer: Self-pay | Admitting: Medical

## 2024-05-08 ENCOUNTER — Other Ambulatory Visit: Payer: Self-pay | Admitting: Obstetrics and Gynecology

## 2024-05-08 DIAGNOSIS — I10 Essential (primary) hypertension: Secondary | ICD-10-CM

## 2024-05-08 DIAGNOSIS — Z79890 Hormone replacement therapy (postmenopausal): Secondary | ICD-10-CM

## 2024-05-19 ENCOUNTER — Other Ambulatory Visit: Payer: Self-pay

## 2024-05-19 ENCOUNTER — Encounter: Payer: Self-pay | Admitting: Obstetrics and Gynecology

## 2024-05-19 DIAGNOSIS — Z79890 Hormone replacement therapy (postmenopausal): Secondary | ICD-10-CM

## 2024-05-19 MED ORDER — PROGESTERONE MICRONIZED 100 MG PO CAPS: 100.0000 mg | ORAL_CAPSULE | Freq: Every day | ORAL | 3 refills | Status: AC

## 2024-05-27 ENCOUNTER — Ambulatory Visit (INDEPENDENT_AMBULATORY_CARE_PROVIDER_SITE_OTHER): Admitting: Family Medicine

## 2024-06-02 ENCOUNTER — Encounter (INDEPENDENT_AMBULATORY_CARE_PROVIDER_SITE_OTHER): Payer: Self-pay | Admitting: Family Medicine

## 2024-06-02 ENCOUNTER — Ambulatory Visit (INDEPENDENT_AMBULATORY_CARE_PROVIDER_SITE_OTHER): Admitting: Family Medicine

## 2024-06-02 VITALS — BP 112/62 | HR 76 | Temp 97.7°F | Ht 68.0 in | Wt 285.0 lb

## 2024-06-02 DIAGNOSIS — E669 Obesity, unspecified: Secondary | ICD-10-CM

## 2024-06-02 DIAGNOSIS — Z6841 Body Mass Index (BMI) 40.0 and over, adult: Secondary | ICD-10-CM

## 2024-06-02 DIAGNOSIS — G4733 Obstructive sleep apnea (adult) (pediatric): Secondary | ICD-10-CM | POA: Diagnosis not present

## 2024-06-02 DIAGNOSIS — I10 Essential (primary) hypertension: Secondary | ICD-10-CM | POA: Diagnosis not present

## 2024-06-02 DIAGNOSIS — R7303 Prediabetes: Secondary | ICD-10-CM | POA: Diagnosis not present

## 2024-06-02 MED ORDER — PHENTERMINE HCL 37.5 MG PO CAPS: 37.5000 mg | ORAL_CAPSULE | ORAL | 0 refills | Status: DC

## 2024-06-02 MED ORDER — AMLODIPINE BESYLATE 5 MG PO TABS: 5.0000 mg | ORAL_TABLET | Freq: Every day | ORAL | 0 refills | Status: DC

## 2024-06-02 MED ORDER — METFORMIN HCL 500 MG PO TABS: ORAL_TABLET | ORAL | 0 refills | Status: DC

## 2024-06-02 NOTE — Progress Notes (Signed)
 Office: [phone]  /  Fax: [phone]  WEIGHT SUMMARY AND BIOMETRICS  Anthropometric Measurements Height: 5' 8 (1.727 m) Weight: 285 lb (129.3 kg) BMI (Calculated): 43.34 Weight at Last Visit: 283lb Weight Lost Since Last Visit: 0lb Weight Gained Since Last Visit: 2lb Starting Weight: 378 Total Weight Loss (lbs): 93 lb (42.2 kg) Peak Weight: 407lb   Body Composition  Body Fat %: 51.5 % Fat Mass (lbs): 146.8 lbs Muscle Mass (lbs): 131.2 lbs Total Body Water  (lbs): 101.8 lbs Visceral Fat Rating : 17   Other Clinical Data Fasting: No Labs: No Today's Visit #: 10 Starting Date: 12/27/20    Chief Complaint: OBESITY    History of Present Illness Laura Knox is a 58 year old female with obesity and prediabetes who presents for obesity treatment and progress assessment.  She follows a pescetarian eating plan 95% of the time and engages in 90 minutes of exercise six days a week. Despite these efforts, she has gained two pounds over the last month. She consumes over 100 grams of protein daily and is mindful of her calorie intake, though she finds it challenging to manage. She is currently taking phentermine , which she finds mildly helpful in reducing her appetite.  In addition to obesity, she is being treated for prediabetes with metformin , 500 mg, two pills at lunch, and requests a refill.  She has a history of obstructive sleep apnea and has undergone multiple sleep studies in the past, though not recently. She experiences difficulty tolerating CPAP machines due to claustrophobia and anxiety, which worsens with attempts to use the equipment. She has not explored medication options for sleep apnea related to weight loss.  Her social history includes being very active, more so than in the past 20 years, and she follows an intermittent fasting schedule of 8/16. She maintains an active social life, recently attending a [employer] concert and hosting a party.       PHYSICAL EXAM:  Blood pressure 112/62, pulse 76, temperature 97.7 F (36.5 C), height 5' 8 (1.727 m), weight 285 lb (129.3 kg), SpO2 98%. Body mass index is 43.33 kg/m.  DIAGNOSTIC DATA REVIEWED:  BMET    Component Value Date/Time   NA 140 02/02/2024 0917   K 4.2 02/02/2024 0917   CL 105 02/02/2024 0917   CO2 22 02/02/2024 0917   GLUCOSE 99 02/02/2024 0917   GLUCOSE 98 07/10/2023 1524   BUN 18 02/02/2024 0917   CREATININE 0.72 02/02/2024 0917   CALCIUM 8.7 02/02/2024 0917   GFRNONAA >60 07/10/2023 1524   GFRAA 128 02/06/2021 0000   Lab Results  Component Value Date   HGBA1C 5.7 (H) 02/02/2024   HGBA1C 6.0 (H) 12/27/2020   Lab Results  Component Value Date   INSULIN  11.6 02/02/2024   INSULIN  20.4 12/27/2020   Lab Results  Component Value Date   TSH 1.060 02/02/2024   CBC    Component Value Date/Time   WBC 8.4 09/30/2023 0915   RBC 4.75 09/30/2023 0915   HGB 13.9 09/30/2023 0915   HGB 13.7 12/27/2020 0848   HCT 42.4 09/30/2023 0915   HCT 41.2 12/27/2020 0848   PLT 360.0 09/30/2023 0915   PLT 305 12/27/2020 0848   MCV 89.2 09/30/2023 0915   MCV 87 12/27/2020 0848   MCH 28.1 07/10/2023 1524   MCHC 32.8 09/30/2023 0915   RDW 13.5 09/30/2023 0915   RDW 13.2 12/27/2020 0848   Iron Studies    Component Value Date/Time   FERRITIN  88 02/06/2021 0000   Lipid Panel     Component Value Date/Time   CHOL 139 02/02/2024 0917   TRIG 59 02/02/2024 0917   HDL 52 02/02/2024 0917   CHOLHDL 3 02/04/2023 0715   VLDL 10.6 02/04/2023 0715   LDLCALC 75 02/02/2024 0917   Hepatic Function Panel     Component Value Date/Time   PROT 6.6 02/02/2024 0917   ALBUMIN 4.0 02/02/2024 0917   AST 10 02/02/2024 0917   ALT 9 02/02/2024 0917   ALKPHOS 95 02/02/2024 0917   BILITOT 0.5 02/02/2024 0917      Component Value Date/Time   TSH 1.060 02/02/2024 0917   Nutritional Lab Results  Component Value Date   VD25OH 36.6 02/02/2024   VD25OH 30.4 10/13/2023   VD25OH  41.3 08/12/2022     Assessment and Plan Assessment & Plan Obesity Obesity management with pescetarian diet and exercise. Weight gain of 2 pounds over the last month despite adherence to diet and exercise. Phentermine  is being used to aid weight loss. Discussed the need for a 3% weight loss within three months to justify continued use of phentermine . If not achieved, risks may outweigh benefits. She is against weight loss surgery due to concerns about malabsorption and lifelong supplement use. - Continue pescetarian diet and exercise regimen - Continue phentermine  - Monitor weight loss progress; reassess phentermine  use if no significant weight loss by October - Discuss potential use of GLP-1 drugs for weight loss if sleep apnea criteria are met  Prediabetes Prediabetes managed with metformin  and lifestyle modifications. Emphasis on weight loss to improve glycemic control. - Refill metformin  500 mg, two pills at lunch - Continue diet, exercise and weight loss as discussed today as an important part of the treatment plan   Obstructive sleep apnea Obstructive sleep apnea with intolerance to CPAP due to claustrophobia. Discussed potential use of GLP-1 drugs for weight loss if moderate to severe sleep apnea is confirmed. She is to provide previous sleep study results showing AHI for insurance coverage of GLP-1 drugs. Discussed the possibility of a new sleep study or referral to [facility] if needed. - Obtain previous sleep study results showing AHI - Discuss sleep apnea management with pulmonologist - Consider referral to [facility] for sleep study if needed - Continue diet, exercise and weight loss as discussed today as an important part of the treatment plan   Hypertension Hypertension is well-controlled. She is on amlodipine . - Refill amlodipine  - Continue diet, exercise and weight loss as discussed today as an important part of the treatment plan      She was  informed of the importance of frequent follow up visits to maximize her success with intensive lifestyle modifications for her multiple health conditions.    Louann Penton, MD

## 2024-06-03 ENCOUNTER — Ambulatory Visit (INDEPENDENT_AMBULATORY_CARE_PROVIDER_SITE_OTHER): Admitting: Pulmonary Disease

## 2024-06-03 ENCOUNTER — Encounter: Payer: Self-pay | Admitting: Pulmonary Disease

## 2024-06-03 VITALS — BP 122/79 | HR 82

## 2024-06-03 DIAGNOSIS — G4733 Obstructive sleep apnea (adult) (pediatric): Secondary | ICD-10-CM | POA: Diagnosis not present

## 2024-06-03 NOTE — Patient Instructions (Addendum)
 Schedule for an in lab sleep study -In-lab study will be the most appropriate study because of your significant weight loss, the gold standard to ruling out significant sleep apnea  Follow-up in 6 to 8 weeks  Continue weight loss efforts  Call us  with significant concerns

## 2024-06-03 NOTE — Progress Notes (Addendum)
 Laura Knox    [id number]    [birth year]  Primary Care Physician:[place], [place] RIGGERS  Referring Physician: Douglass Kenney NOVAK, FNP [address] [place],  [place] [address]  Chief complaint:   Patient being seen for concern for sleep disordered breathing  HPI:  She does have a history of obstructive sleep apnea did not tolerate CPAP in the past  I had seen a few years ago with an abnormal chest x-ray with a left upper lobe nodule  She continues to work with the bariatric program and has lost about 100 pounds since the last time she was here She is feeling better overall Weight loss has plateaued despite being on phentermine  and being a lot more active Consideration for Tirzepatide which is approved for obesity and obstructive sleep apnea is being investigated  She does have a history of sleep apnea History of snoring She does sleep in a reclining bed, goes to bed between 10 and 11 PM Falls asleep quickly 2-3 awakenings Final wake up time between 6 and 630 Wakes up feeling rested on most days Memory is good No significant dryness of the mouth in the morning, no headaches, does have night sweats She feels her sleep quality is good overall Does have a history of hypertension  She feels she is claustrophobic and not able to tolerate any mask over her face  She is not very sleepy during the day  Outpatient Encounter Medications as of 06/03/2024  Medication Sig   amLODipine  (NORVASC ) 5 MG tablet Take 1 tablet (5 mg total) by mouth daily.   Ascorbic Acid (VITAMIN C) 1000 MG tablet Take 1,000 mg by mouth daily.   cetirizine (ZYRTEC) 10 MG tablet Take 10 mg by mouth daily.   estradiol  (VIVELLE -DOT) 0.1 MG/24HR patch PLACE 1 PATCH (0.1 MG TOTAL) ONTO THE SKIN 2 (TWO) TIMES A WEEK.   levothyroxine  (SYNTHROID ) 100 MCG tablet TAKE 1 TABLET BY MOUTH EVERY DAY   losartan  (COZAAR ) 100 MG tablet TAKE 1 TABLET BY MOUTH EVERYDAY AT BEDTIME   metFORMIN  (GLUCOPHAGE ) 500 MG  tablet 2 po with lunch qd   Multiple Vitamins-Minerals (HAIR/SKIN/NAILS/BIOTIN PO) Take 2 tablets by mouth daily.   Multiple Vitamins-Minerals (MULTIVITAMIN ADULT) CHEW Chew 2 each by mouth daily.   phentermine  37.5 MG capsule Take 1 capsule (37.5 mg total) by mouth every morning.   polyethylene glycol powder (GLYCOLAX /MIRALAX ) 17 GM/SCOOP powder Take 1 Container by mouth daily.   progesterone  (PROMETRIUM ) 100 MG capsule Take 1 capsule (100 mg total) by mouth at bedtime.   traMADol  (ULTRAM ) 50 MG tablet Take 1 tablet (50 mg total) by mouth 3 (three) times daily as needed.   No facility-administered encounter medications on file as of 06/03/2024.    Allergies as of 06/03/2024 - Review Complete 06/03/2024  Allergen Reaction Noted   Chlorthalidone  Other (See Comments) 07/01/2023    Past Medical History:  Diagnosis Date   B12 deficiency    Bilateral swelling of feet and ankles    Complication of anesthesia    woke up feeling sore head to toe.  Did not have any issuses with anesthesia in January 2024.   Family history of adverse reaction to anesthesia    mother - nausea   History of kidney stones    Hypertension    Hypothyroidism    Joint pain    Knee pain    Osteoarthritis    Other fatigue    Pre-diabetes    Prediabetes  Sleep apnea    does not use CPAP   Thyroid  disease    Vitamin D  deficiency     Past Surgical History:  Procedure Laterality Date   COLONOSCOPY WITH PROPOFOL  N/A 04/10/2021   Procedure: COLONOSCOPY WITH PROPOFOL ;  Surgeon: Charlanne Groom, MD;  Location: [facility];  Service: Endoscopy;  Laterality: N/A;   JOINT REPLACEMENT     KIDNEY STONE SURGERY     miniscus repair Left    POLYPECTOMY  04/10/2021   Procedure: POLYPECTOMY;  Surgeon: Charlanne Groom, MD;  Location: [facility];  Service: Endoscopy;;   SEPTOPLASTY     TOTAL KNEE ARTHROPLASTY Left 11/22/2022   Procedure: LEFT TOTAL KNEE REPLACEMENT;  Surgeon: Jerri Kay HERO, MD;  Location: [facility] OR;  Service:  Orthopedics;  Laterality: Left;   TOTAL KNEE ARTHROPLASTY Right 07/21/2023   Procedure: RIGHT TOTAL KNEE REPLACEMENT;  Surgeon: Jerri Kay HERO, MD;  Location: [facility];  Service: Orthopedics;  Laterality: Right;    Family History  Problem Relation Age of Onset   Healthy Mother    Obesity Mother    Cancer Father    Obesity Father    Cancer - Other Father    Stroke Maternal Grandmother    Lung cancer Maternal Grandfather     Social History   Socioeconomic History   Marital status: Married    Spouse name: Prentice   Number of children: 0   Years of education: Not on file   Highest education level: Not on file  Occupational History   Occupation: Retired  Tobacco Use   Smoking status: Never   Smokeless tobacco: Never  Vaping Use   Vaping status: Never Used  Substance and Sexual Activity   Alcohol use: Not Currently   Drug use: Never   Sexual activity: Yes    Partners: Male  Other Topics Concern   Not on file  Social History Narrative   Not on file   Social Drivers of Health   Financial Resource Strain: Low Risk  (03/17/2022)   Received from [facility]   Overall Financial Resource Strain (CARDIA)    Difficulty of Paying Living Expenses: Not hard at all  Food Insecurity: No Food Insecurity (03/17/2022)   [facility]   Hunger Vital Sign    Within the past 12 months, you worried that your food would run out before you got the money to buy more.: Never true    Within the past 12 months, the food you bought just didn't last and you didn't have money to get more.: Never true  Transportation Needs: Not on file  Physical Activity: Sufficiently Active (03/17/2022)   Received from [facility]   Exercise Vital Sign    On average, how many days per week do you engage in moderate to strenuous exercise (like a brisk walk)?: 5 days    On average, how many minutes do you engage in exercise at this level?: 60 min  Stress: No Stress Concern Present (03/17/2022)   Received  from [facility] - Occupational Stress Questionnaire    Feeling of Stress : Not at all  Social Connections: Unknown (03/25/2023)   [facility]   Social Network    Social Network: Not on file  Intimate Partner Violence: Unknown (03/25/2023)   Received from [facility]   HITS    Physically Hurt: Not on file    Insult or Talk Down To: Not on file    Threaten  Physical Harm: Not on file    Scream or Curse: Not on file    Review of Systems  Respiratory:  Positive for apnea. Negative for shortness of breath.   Psychiatric/Behavioral:  Positive for sleep disturbance.     Vitals:   06/03/24 1412  BP: 122/79  Pulse: 82  SpO2: 98%     Physical Exam Constitutional:      Appearance: She is obese.  HENT:     Head: Normocephalic.     Nose: Nose normal.  Eyes:     General: No scleral icterus.    Pupils: Pupils are equal, round, and reactive to light.  Cardiovascular:     Rate and Rhythm: Normal rate and regular rhythm.     Heart sounds: No murmur heard.    No friction rub.  Pulmonary:     Effort: No respiratory distress.     Breath sounds: No stridor. No wheezing or rhonchi.  Musculoskeletal:     Cervical back: No rigidity or tenderness.  Neurological:     Mental Status: She is alert.  Psychiatric:        Mood and Affect: Mood normal.       06/03/2024    2:00 PM  Results of the Epworth flowsheet  Sitting and reading 1  Watching TV 0  Sitting, inactive in a public place (e.g. a theatre or a meeting) 0  As a passenger in a car for an hour without a break 0  Lying down to rest in the afternoon when circumstances permit 3  Sitting and talking to someone 0  Sitting quietly after a lunch without alcohol 0  In a car, while stopped for a few minutes in traffic 0  Total score 4    Data Reviewed: Previous records reviewed  Previous sleep study not available for review  Assessment/Plan: Past history of  obstructive sleep apnea  Class III obesity - With significant weight loss - Decreased metabolism  History of hypothyroidism - Controlled on Synthroid   On phentermine  for weight management  She continues to follow-up with the bariatric center and consideration for GLP-1 and GIP inhibitor -This will be approved for sleep apnea and being overweight with ongoing consistent efforts to lose more weight  Will schedule her for an in lab study as this will be the gold standard to rule out significant sleep disordered breathing, may not be very significant as she is lost a whole lot of weight over 100 pounds, will also be important to rule out significant oxygen desaturations  Follow-up in about 6 to 8 weeks     Jennet Epley MD [facility] 06/03/2024, 2:38 PM  CC: Webb, Padonda B, FNP

## 2024-06-06 ENCOUNTER — Other Ambulatory Visit: Payer: Self-pay | Admitting: Medical

## 2024-06-24 ENCOUNTER — Ambulatory Visit (INDEPENDENT_AMBULATORY_CARE_PROVIDER_SITE_OTHER): Admitting: Family Medicine

## 2024-06-24 ENCOUNTER — Encounter (INDEPENDENT_AMBULATORY_CARE_PROVIDER_SITE_OTHER): Payer: Self-pay | Admitting: Family Medicine

## 2024-06-24 DIAGNOSIS — I10 Essential (primary) hypertension: Secondary | ICD-10-CM | POA: Diagnosis not present

## 2024-06-24 DIAGNOSIS — E669 Obesity, unspecified: Secondary | ICD-10-CM

## 2024-06-24 DIAGNOSIS — R7303 Prediabetes: Secondary | ICD-10-CM | POA: Diagnosis not present

## 2024-06-24 DIAGNOSIS — Z6841 Body Mass Index (BMI) 40.0 and over, adult: Secondary | ICD-10-CM

## 2024-06-24 MED ORDER — AMLODIPINE BESYLATE 5 MG PO TABS: 5.0000 mg | ORAL_TABLET | Freq: Every day | ORAL | 0 refills | Status: DC

## 2024-06-24 MED ORDER — METFORMIN HCL 500 MG PO TABS
ORAL_TABLET | ORAL | 0 refills | Status: DC
Start: 2024-06-24 — End: 2024-07-22

## 2024-06-24 MED ORDER — PHENTERMINE HCL 37.5 MG PO CAPS
37.5000 mg | ORAL_CAPSULE | ORAL | 0 refills | Status: DC
Start: 2024-06-24 — End: 2024-07-22

## 2024-06-24 NOTE — Progress Notes (Signed)
 Office: [phone]  /  Fax: [phone]  WEIGHT SUMMARY AND BIOMETRICS  Anthropometric Measurements Height: 5' 8 (1.727 m) Weight: 280 lb (127 kg) BMI (Calculated): 42.58 Weight at Last Visit: 285 lb Weight Lost Since Last Visit: 5 lb Weight Gained Since Last Visit: 0 Starting Weight: 378 lb Total Weight Loss (lbs): 98 lb (44.5 kg) Peak Weight: 407 lb   Body Composition  Body Fat %: 49.8 % Fat Mass (lbs): 139.6 lbs Muscle Mass (lbs): 133.6 lbs Total Body Water  (lbs): 102.2 lbs Visceral Fat Rating : 17   Other Clinical Data Fasting: yes Labs: no Today's Visit #: 54 Starting Date: 12/27/20    Chief Complaint: OBESITY  History of Present Illness Laura Knox is a 58 year old female with obesity and hypertension who presents for obesity treatment plan assessment and progress evaluation.  She follows a pescetarian diet approximately 85% of the time and exercises five to six days a week for about 75 minutes, incorporating cardio, strengthening, and flexibility classes. She is working on meeting her protein and fruits and vegetable goals and is trying to hydrate adequately. However, she sometimes skips meals and does not consistently get 7-9 hours of sleep per night. She has lost five pounds in the last three weeks since her last visit.  She is currently on phentermine  for obesity, which has not negatively affected her blood pressure. She has hypertension and is being treated with losartan  and amlodipine . She is also working on managing her hypertension with diet, exercise, and weight loss.  She has a history of prediabetes and is being treated with metformin . She is focusing on diet, exercise, and weight loss to manage this condition as well.  Her social history reveals that she is frequently on the road and has a varied daily routine, impacting her meal planning and exercise consistency. She is trying to plan better to avoid fast food and is mindful of her protein  intake, aiming to stay low on calories and high on protein. She engages in physical activities such as yard work and walking, which she finds beneficial.  Her family history includes a mother with IBS. This situation serves as a cautionary tale for her, motivating her to pay attention to her own health and dietary habits.      PHYSICAL EXAM:  Blood pressure 117/75, pulse 66, temperature 97.8 F (36.6 C), height 5' 8 (1.727 m), weight 280 lb (127 kg), SpO2 99%. Body mass index is 42.57 kg/m.  DIAGNOSTIC DATA REVIEWED:  BMET    Component Value Date/Time   NA 140 02/02/2024 0917   K 4.2 02/02/2024 0917   CL 105 02/02/2024 0917   CO2 22 02/02/2024 0917   GLUCOSE 99 02/02/2024 0917   GLUCOSE 98 07/10/2023 1524   BUN 18 02/02/2024 0917   CREATININE 0.72 02/02/2024 0917   CALCIUM 8.7 02/02/2024 0917   GFRNONAA >60 07/10/2023 1524   GFRAA 128 02/06/2021 0000   Lab Results  Component Value Date   HGBA1C 5.7 (H) 02/02/2024   HGBA1C 6.0 (H) 12/27/2020   Lab Results  Component Value Date   INSULIN  11.6 02/02/2024   INSULIN  20.4 12/27/2020   Lab Results  Component Value Date   TSH 1.060 02/02/2024   CBC    Component Value Date/Time   WBC 8.4 09/30/2023 0915   RBC 4.75 09/30/2023 0915   HGB 13.9 09/30/2023 0915   HGB 13.7 12/27/2020 0848   HCT 42.4 09/30/2023 0915   HCT 41.2 12/27/2020 0848  PLT 360.0 09/30/2023 0915   PLT 305 12/27/2020 0848   MCV 89.2 09/30/2023 0915   MCV 87 12/27/2020 0848   MCH 28.1 07/10/2023 1524   MCHC 32.8 09/30/2023 0915   RDW 13.5 09/30/2023 0915   RDW 13.2 12/27/2020 0848   Iron Studies    Component Value Date/Time   FERRITIN 88 02/06/2021 0000   Lipid Panel     Component Value Date/Time   CHOL 139 02/02/2024 0917   TRIG 59 02/02/2024 0917   HDL 52 02/02/2024 0917   CHOLHDL 3 02/04/2023 0715   VLDL 10.6 02/04/2023 0715   LDLCALC 75 02/02/2024 0917   Hepatic Function Panel     Component Value Date/Time   PROT 6.6  02/02/2024 0917   ALBUMIN 4.0 02/02/2024 0917   AST 10 02/02/2024 0917   ALT 9 02/02/2024 0917   ALKPHOS 95 02/02/2024 0917   BILITOT 0.5 02/02/2024 0917      Component Value Date/Time   TSH 1.060 02/02/2024 0917   Nutritional Lab Results  Component Value Date   VD25OH 36.6 02/02/2024   VD25OH 30.4 10/13/2023   VD25OH 41.3 08/12/2022     Assessment and Plan Assessment & Plan Obesity Obesity management with a pescetarian eating plan and regular exercise. Reports adherence to the diet 85% of the time and exercises 5-6 days a week. Lost 5 pounds in the last 3 weeks. Currently on phentermine  without adverse effects on blood pressure. Discussed the importance of meal planning and avoiding fast food. Mindful of protein intake and working on improving hydration and sleep. Expressed reluctance to use Ozempic  and prefers addressing underlying issues such as sleep apnea. - Continue pescetarian eating plan - Continue current exercise regimen - Continue phentermine  - Encourage meal planning and avoiding fast food - Monitor weight and progress  Essential hypertension Hypertension is currently controlled with losartan  and amlodipine . No adverse effects noted from phentermine  on blood pressure. Emphasis on diet, exercise, and weight loss as part of hypertension management. - Continue losartan  - Continue amlodipine  - Monitor blood pressure regularly - Continue diet, exercise and weight loss as discussed today as an important part of the treatment plan   Prediabetes Prediabetes managed with metformin , diet, exercise, and weight loss. Emphasis on maintaining a balanced diet and regular physical activity to manage blood glucose levels. - Continue metformin  - Continue diet and exercise regimen    She was informed of the importance of frequent follow up visits to maximize her success with intensive lifestyle modifications for her multiple health conditions.    Louann Penton, MD

## 2024-06-26 ENCOUNTER — Other Ambulatory Visit: Payer: Self-pay | Admitting: Medical

## 2024-07-06 ENCOUNTER — Other Ambulatory Visit (INDEPENDENT_AMBULATORY_CARE_PROVIDER_SITE_OTHER): Payer: Self-pay

## 2024-07-06 ENCOUNTER — Ambulatory Visit: Admitting: Orthopaedic Surgery

## 2024-07-06 DIAGNOSIS — Z96651 Presence of right artificial knee joint: Secondary | ICD-10-CM

## 2024-07-06 DIAGNOSIS — Z96652 Presence of left artificial knee joint: Secondary | ICD-10-CM

## 2024-07-06 DIAGNOSIS — Z96653 Presence of artificial knee joint, bilateral: Secondary | ICD-10-CM

## 2024-07-06 NOTE — Progress Notes (Signed)
 Office Visit Note   Patient: Laura Knox           Date of Birth: 16-Apr-1966           MRN: [id number] Visit Date: 07/06/2024              Requested by: Dorina Loving, PA-C [id number] [place],  [place] [address] PCP: Dorina Loving, PA-C   Assessment & Plan: Visit Diagnoses:  1. Status post total right knee replacement   2. Status post total left knee replacement     Plan: History of Present Illness Laura Knox is a 58 year old female who presents for follow-up of bilateral knee replacements.  She is one year post-operative from right knee replacement and one and a half years post-operative from left knee replacement. She experiences persistent, slowly improving pain in the hinge area of the right knee, described as sharp and brief, especially when moving from a bent to a straight position in bed. Stiffness is present in the right knee.  She has completed physical therapy and now participates in [employer] classes twice a week, engaging in low-impact exercises such as dancing and squatting. She tolerates these activities well and can walk a mile or more, focusing on stamina and strength through varied routines including pool exercises, free weights, and machines.  She notes a sensation of heaviness in her legs. She has lost approximately 100 pounds over the past few years with a weight management program, contributing to her overall health and recovery.  Assessment and Plan Status post bilateral total knee arthroplasty Mild persistent pain and stiffness, improving. Occasional sharp pain self-resolving. Good implant positioning on x-rays. - Continue current exercise regimen including [employer] classes, walking, pool exercises, free weights, and machines. - Schedule follow-up appointment in one year with x-rays of the implants.  Follow-Up Instructions: Return in about 1 year (around 07/06/2025).   Orders:  Orders Placed This Encounter   Procedures   XR Knee 1-2 Views Right   XR Knee 1-2 Views Left   No orders of the defined types were placed in this encounter.     Procedures: No procedures performed   Clinical Data: No additional findings.   Subjective: Chief Complaint  Patient presents with   Right Knee - Follow-up    Right TKA 07/21/2023    Left Knee - Follow-up    Left TKA 11/22/2022   Imaging: XR Knee 1-2 Views Left Result Date: 07/06/2024 X-rays of the left knee show a stable left total knee replacement in good alignment.   XR Knee 1-2 Views Right Result Date: 07/06/2024 X-rays of the right knee demonstrate a stable right total knee replacement in good alignment     PMFS History: Patient Active Problem List   Diagnosis Date Noted   DOE (dyspnea on exertion) 03/01/2024   Polyphagia 03/01/2024   Hypothyroidism 02/02/2024   Other constipation 08/12/2023   Status post total right knee replacement 07/21/2023   Depression 12/24/2022   BMI 40.0-44.9, adult (HCC) 12/24/2022   Obesity, Beginning BMI 61.01 12/24/2022   Status post total left knee replacement 11/22/2022   Primary osteoarthritis of right knee 11/21/2022   Left knee pain 10/14/2022   Achilles tendinitis 03/21/2022   Pre-diabetes 02/27/2022   Vitamin D  deficiency 02/27/2022   At risk for diabetes mellitus 02/27/2022   Plantar fasciitis of right foot 01/08/2022   Special screening for malignant neoplasms, colon    Rectal polyp    Essential  hypertension 01/29/2021   Class 3 severe obesity with serious comorbidity and body mass index (BMI) of 60.0 to 69.9 in adult 01/29/2021   Decreased functional activity tolerance 07/24/2020   Cervical spondylosis 07/24/2020   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   OSA (obstructive sleep apnea) 07/20/2015   Arthritis 01/06/2014   Past Medical History:  Diagnosis Date   B12 deficiency    Bilateral swelling of feet and ankles    Complication of anesthesia    woke up feeling sore head to toe.  Did not have  any issuses with anesthesia in January 2024.   Family history of adverse reaction to anesthesia    mother - nausea   History of kidney stones    Hypertension    Hypothyroidism    Joint pain    Knee pain    Osteoarthritis    Other fatigue    Pre-diabetes    Prediabetes    Sleep apnea    does not use CPAP   Thyroid  disease    Vitamin D  deficiency     Family History  Problem Relation Age of Onset   Healthy Mother    Obesity Mother    Cancer Father    Obesity Father    Cancer - Other Father    Stroke Maternal Grandmother    Lung cancer Maternal Grandfather     Past Surgical History:  Procedure Laterality Date   COLONOSCOPY WITH PROPOFOL  N/A 04/10/2021   Procedure: COLONOSCOPY WITH PROPOFOL ;  Surgeon: Charlanne Groom, MD;  Location: [facility];  Service: Endoscopy;  Laterality: N/A;   JOINT REPLACEMENT     KIDNEY STONE SURGERY     miniscus repair Left    POLYPECTOMY  04/10/2021   Procedure: POLYPECTOMY;  Surgeon: Charlanne Groom, MD;  Location: [facility];  Service: Endoscopy;;   SEPTOPLASTY     TOTAL KNEE ARTHROPLASTY Left 11/22/2022   Procedure: LEFT TOTAL KNEE REPLACEMENT;  Surgeon: Jerri Kay HERO, MD;  Location: [facility];  Service: Orthopedics;  Laterality: Left;   TOTAL KNEE ARTHROPLASTY Right 07/21/2023   Procedure: RIGHT TOTAL KNEE REPLACEMENT;  Surgeon: Jerri Kay HERO, MD;  Location: [facility];  Service: Orthopedics;  Laterality: Right;   Social History   Occupational History   Occupation: Retired  Tobacco Use   Smoking status: Never   Smokeless tobacco: Never  Vaping Use   Vaping status: Never Used  Substance and Sexual Activity   Alcohol use: Not Currently   Drug use: Never   Sexual activity: Yes    Partners: Male

## 2024-07-22 ENCOUNTER — Ambulatory Visit (INDEPENDENT_AMBULATORY_CARE_PROVIDER_SITE_OTHER): Admitting: Family Medicine

## 2024-07-22 ENCOUNTER — Encounter (INDEPENDENT_AMBULATORY_CARE_PROVIDER_SITE_OTHER): Payer: Self-pay | Admitting: Family Medicine

## 2024-07-22 ENCOUNTER — Ambulatory Visit (HOSPITAL_BASED_OUTPATIENT_CLINIC_OR_DEPARTMENT_OTHER): Attending: Pulmonary Disease | Admitting: Pulmonary Disease

## 2024-07-22 VITALS — BP 111/74 | HR 86 | Temp 98.1°F | Ht 66.0 in | Wt 283.0 lb

## 2024-07-22 DIAGNOSIS — Z6841 Body Mass Index (BMI) 40.0 and over, adult: Secondary | ICD-10-CM

## 2024-07-22 DIAGNOSIS — R29818 Other symptoms and signs involving the nervous system: Secondary | ICD-10-CM | POA: Diagnosis not present

## 2024-07-22 DIAGNOSIS — R7303 Prediabetes: Secondary | ICD-10-CM

## 2024-07-22 DIAGNOSIS — I10 Essential (primary) hypertension: Secondary | ICD-10-CM | POA: Diagnosis not present

## 2024-07-22 DIAGNOSIS — M25551 Pain in right hip: Secondary | ICD-10-CM | POA: Diagnosis not present

## 2024-07-22 DIAGNOSIS — M25552 Pain in left hip: Secondary | ICD-10-CM

## 2024-07-22 DIAGNOSIS — G4733 Obstructive sleep apnea (adult) (pediatric): Secondary | ICD-10-CM | POA: Insufficient documentation

## 2024-07-22 DIAGNOSIS — G4761 Periodic limb movement disorder: Secondary | ICD-10-CM | POA: Insufficient documentation

## 2024-07-22 MED ORDER — METFORMIN HCL 500 MG PO TABS
ORAL_TABLET | ORAL | 0 refills | Status: DC
Start: 2024-07-22 — End: 2024-08-19

## 2024-07-22 MED ORDER — AMLODIPINE BESYLATE 5 MG PO TABS: 5.0000 mg | ORAL_TABLET | Freq: Every day | ORAL | 0 refills | Status: DC

## 2024-07-22 MED ORDER — PHENTERMINE HCL 37.5 MG PO CAPS: 37.5000 mg | ORAL_CAPSULE | ORAL | 0 refills | Status: DC

## 2024-07-22 NOTE — Progress Notes (Signed)
 Office: [phone]  /  Fax: [phone]  WEIGHT SUMMARY AND BIOMETRICS  Anthropometric Measurements Height: 5' 6 (1.676 m) (rechecked height today) Weight: 283 lb (128.4 kg) BMI (Calculated): 45.7 Weight at Last Visit: 280 lb Weight Lost Since Last Visit: 0 Weight Gained Since Last Visit: 3 lb Starting Weight: 378 lb Total Weight Loss (lbs): 95 lb (43.1 kg) Peak Weight: 407 lb   Body Composition  Body Fat %: 52.2 % Fat Mass (lbs): 148 lbs Muscle Mass (lbs): 128.6 lbs Total Body Water  (lbs): 98.8 lbs Visceral Fat Rating : 18   Other Clinical Data Fasting: no Labs: no Today's Visit #: 55 Starting Date: 12/27/20    Chief Complaint: OBESITY   History of Present Illness Laura Knox is a 58 year old female with obesity, prediabetes, and hypertension who presents for obesity treatment and progress assessment.  She is on a pescetarian eating plan but struggles with adherence, resulting in a weight gain of three pounds over the past month. She takes phentermine  37.5 mg daily and metformin  500 mg twice daily for prediabetes. Despite these measures, she experiences increased hunger and often succumbs to sweets, attributing this to stress.  She takes amlodipine  for hypertension, and her most recent blood pressure reading was 111/74 mmHg. No issues with fluid retention, but she notes morning hand swelling, which she attributes to arthritis.  She maintains an active lifestyle, regularly attending the gym and participating in pool classes. A recent trip to [place] disrupted her routine, leading to missed workout days. She reports poor sleep quality, influenced by hip pain from workouts and frequent nocturnal urination. She occasionally uses Tylenol  for pain relief but is cautious due to stomach sensitivity. A sleep study is scheduled to investigate potential sleep apnea.      PHYSICAL EXAM:  Blood pressure 111/74, pulse 86, temperature 98.1 F (36.7 C), height 5' 6  (1.676 m), weight 283 lb (128.4 kg), SpO2 97%. Body mass index is 45.68 kg/m.  DIAGNOSTIC DATA REVIEWED:  BMET    Component Value Date/Time   NA 140 02/02/2024 0917   K 4.2 02/02/2024 0917   CL 105 02/02/2024 0917   CO2 22 02/02/2024 0917   GLUCOSE 99 02/02/2024 0917   GLUCOSE 98 07/10/2023 1524   BUN 18 02/02/2024 0917   CREATININE 0.72 02/02/2024 0917   CALCIUM 8.7 02/02/2024 0917   GFRNONAA >60 07/10/2023 1524   GFRAA 128 02/06/2021 0000   Lab Results  Component Value Date   HGBA1C 5.7 (H) 02/02/2024   HGBA1C 6.0 (H) 12/27/2020   Lab Results  Component Value Date   INSULIN  11.6 02/02/2024   INSULIN  20.4 12/27/2020   Lab Results  Component Value Date   TSH 1.060 02/02/2024   CBC    Component Value Date/Time   WBC 8.4 09/30/2023 0915   RBC 4.75 09/30/2023 0915   HGB 13.9 09/30/2023 0915   HGB 13.7 12/27/2020 0848   HCT 42.4 09/30/2023 0915   HCT 41.2 12/27/2020 0848   PLT 360.0 09/30/2023 0915   PLT 305 12/27/2020 0848   MCV 89.2 09/30/2023 0915   MCV 87 12/27/2020 0848   MCH 28.1 07/10/2023 1524   MCHC 32.8 09/30/2023 0915   RDW 13.5 09/30/2023 0915   RDW 13.2 12/27/2020 0848   Iron Studies    Component Value Date/Time   FERRITIN 88 02/06/2021 0000   Lipid Panel     Component Value Date/Time   CHOL 139 02/02/2024 0917   TRIG 59 02/02/2024 0917  HDL 52 02/02/2024 0917   CHOLHDL 3 02/04/2023 0715   VLDL 10.6 02/04/2023 0715   LDLCALC 75 02/02/2024 0917   Hepatic Function Panel     Component Value Date/Time   PROT 6.6 02/02/2024 0917   ALBUMIN 4.0 02/02/2024 0917   AST 10 02/02/2024 0917   ALT 9 02/02/2024 0917   ALKPHOS 95 02/02/2024 0917   BILITOT 0.5 02/02/2024 0917      Component Value Date/Time   TSH 1.060 02/02/2024 0917   Nutritional Lab Results  Component Value Date   VD25OH 36.6 02/02/2024   VD25OH 30.4 10/13/2023   VD25OH 41.3 08/12/2022     Assessment and Plan Assessment & Plan Morbid obesity (BMI  40.0-44.9) Morbid obesity with a recent weight gain of three pounds over the last month. Currently on phentermine  37.5 mg. Discussed potential rebound hunger from weight loss medications and emphasized maintaining a balanced diet. Highlighted the importance of physical activity for weight management. - Continue phentermine  37.5 mg, will need to D/C if no further weight loss as the risks will then outweigh the lack of benefit - Encourage adherence to pescetarian eating plan - Encourage regular physical activity, including gym workouts and walking  Prediabetes Prediabetes managed with metformin  500 mg, two pills a day. No issues reported with metformin . Emphasized the role of diet and exercise in managing blood glucose levels. - Continue metformin  500 mg, two pills a day - Encourage adherence to diet and exercise regimen  Essential hypertension Essential hypertension well-controlled with amlodipine . Blood pressure today is 111/74 mmHg. - Continue amlodipine  - Continue diet, exercise and weight loss as discussed today as an important part of the treatment plan   Suspected sleep apnea Suspected sleep apnea with a sleep study scheduled for tonight. Discussed the sleep study process and potential outcomes. - Complete sleep study at [facility]  Bilateral hip arthralgia likely secondary to arthritis Bilateral hip arthralgia likely due to arthritis, exacerbated by physical activity. Tylenol  provides some relief, but she is cautious about overuse due to stomach upset. - Use Tylenol  as needed for pain management - Encourage modification of physical activity to avoid aggravating hip pain      Xzandria was informed of the importance of frequent follow up visits to maximize her success with intensive lifestyle modifications for her obesity and obesity related health conditions as recommended by USPSTF and CMS guidelines   Louann Penton, MD

## 2024-07-25 ENCOUNTER — Telehealth: Payer: Self-pay | Admitting: Pulmonary Disease

## 2024-07-25 ENCOUNTER — Other Ambulatory Visit (INDEPENDENT_AMBULATORY_CARE_PROVIDER_SITE_OTHER): Payer: Self-pay | Admitting: Family Medicine

## 2024-07-25 DIAGNOSIS — I10 Essential (primary) hypertension: Secondary | ICD-10-CM

## 2024-07-25 NOTE — Procedures (Signed)
  Indications for Polysomnography The patient is a 58 year old Female who is 5' 6 and weighs 283.0 lbs. Her BMI equals 46.0.  A full night polysomnogram was performed to evaluate for -.  Medications taken at 2000.PROGESTERONELOSARTAN Polysomnogram Data A full night polysomnogram recorded the standard physiologic parameters including EEG, EOG, EMG, EKG, nasal and oral airflow.  Respiratory parameters of chest and abdominal movements were recorded with Respiratory Inductance Plethysmography belts.   Oxygen saturation was recorded by pulse oximetry.  Sleep Architecture The total recording time of the polysomnogram was 368.5 minutes.  The total sleep time was 232.0 minutes.  The patient spent 22.6% of total sleep time in Stage N1, 63.4% in Stage N2, 5.0% in Stages N3, and 9.1% in REM.  Sleep latency was 8.6 minutes.   REM latency was 239.0 minutes.  Sleep Efficiency was 63.0%.  Wake after Sleep Onset time was 127.5 minutes.  Respiratory Events The polysomnogram revealed a presence of - obstructive, - central, and - mixed apneas resulting in an Apnea index of - events per hour.  There were 69 hypopneas (GreaterEqual to3% desaturation and/or arousal) resulting in an Apnea\Hypopnea Index (AHI  GreaterEqual to3% desaturation and/or arousal) of 17.8 events per hour.  There were 40 hypopneas (GreaterEqual to4% desaturation) resulting in an Apnea\Hypopnea Index (AHI GreaterEqual to4% desaturation) of 10.3 events per hour.  There were 37  Respiratory Effort Related Arousals resulting in a RERA index of 9.6 events per hour. The Respiratory Disturbance Index is 27.4 events per hour.  The snore index was - events per hour.  Mean oxygen saturation was 91.3%.  The lowest oxygen saturation during sleep was 75.0%.  Time spent LessEqual to88% oxygen saturation was  minutes ().  Limb Activity There were 109 total limb movements recorded, of this total, 95 were classified as PLMs.  PLM index was 24.6 per hour and PLM  associated with Arousals index was 5.4 per hour.  Cardiac Summary The average pulse rate was 70.0 bpm.  The minimum pulse rate was 52.0 bpm while the maximum pulse rate was 93.0 bpm.  Cardiac rhythm was normal/abnormal.  Comments: Study completed as a diagnostic study Slept elevated with 4 pillows Poor sleep efficiency with fragmentation  Diagnosis: Mild obstructive sleep apnea with moderate oxygen desaturations AHI of 10.3, O2 nadir of 75% with oxygen below 88% for 12 minutes Poor sleep efficiency with increased wake after sleep onset Sleep fragmentation Moderate periodic limb movement Cardiac rhythm was sinus with VPCs noted  Recommendations: Options for treating mild obstructive sleep apnea may include CPAP therapy if there are significant daytime symptoms or notable comorbidities. Auto CPAP 5-15 with heated humidification and the patient's preferred mask may be considered; other treatment  options may include an oral device, watchful waiting with significant weight loss efforts.   This study was personally reviewed and electronically signed by: Dr. Jennet Epley Accredited Board Certified in Sleep Medicine Date/Time: 07/25/24

## 2024-07-25 NOTE — Telephone Encounter (Signed)
 Call patient  Sleep study result  Date of study: 07/22/2024  Impression: Mild obstructive sleep apnea with AHI of 10.3 with moderate oxygen desaturations with O2 nadir of 75%  Recommendation:  Options for treating mild obstructive sleep apnea may include CPAP therapy if there are significant daytime symptoms or notable comorbidities. Auto CPAP 5-15 with heated humidification and the patient's preferred mask may be considered; other treatment options may include an oral device, watchful waiting with significant weight loss efforts.  Offer appointment for further discussions regarding options of treatment.  CPAP order may be placed if patient agreeable to starting CPAP

## 2024-07-25 NOTE — Procedures (Signed)
 [facility] [address] [place], [place] [address] Tel: [phone]   Fax: [phone]  Polysomnography Interpretation  Patient Name:  Laura Knox, Laura Knox Date:  07/22/2024 Referring Physician:  JENNET EPLEY [phone]) %%startinterp%% Indications for Polysomnography The patient is a 58 year old Female who is 5' 6 and weighs 283.0 lbs. Her BMI equals 46.0.  A full night polysomnogram was performed to evaluate for -.  Medications taken at 2000.  PROGESTERONE   LOSARTAN    Polysomnogram Data A full night polysomnogram recorded the standard physiologic parameters including EEG, EOG, EMG, EKG, nasal and oral airflow.  Respiratory parameters of chest and abdominal movements were recorded with Respiratory Inductance Plethysmography belts.  Oxygen saturation was recorded by pulse oximetry.   Sleep Architecture The total recording time of the polysomnogram was 368.5 minutes.  The total sleep time was 232.0 minutes.  The patient spent 22.6% of total sleep time in Stage N1, 63.4% in Stage N2, 5.0% in Stages N3, and 9.1% in REM.  Sleep latency was 8.6 minutes.  REM latency was 239.0 minutes.  Sleep Efficiency was 63.0%.  Wake after Sleep Onset time was 127.5 minutes.  Respiratory Events The polysomnogram revealed a presence of - obstructive, - central, and - mixed apneas resulting in an Apnea index of - events per hour.  There were 69 hypopneas (>=3% desaturation and/or arousal) resulting in an Apnea\Hypopnea Index (AHI >=3% desaturation and/or arousal) of 17.8 events per hour.  There were 40 hypopneas (>=4% desaturation) resulting in an Apnea\Hypopnea Index (AHI >=4% desaturation) of 10.3 events per hour.  There were 37 Respiratory Effort Related Arousals resulting in a RERA index of 9.6 events per hour. The Respiratory Disturbance Index is 27.4 events per hour.  The snore index was - events per hour.  Mean oxygen saturation was 91.3%.  The lowest oxygen  saturation during sleep was 75.0%.  Time spent <=88% oxygen saturation was 12.3 minutes (3.4%).  Limb Activity There were 109 total limb movements recorded, of this total, 95 were classified as PLMs.  PLM index was 24.6 per hour and PLM associated with Arousals index was 5.4 per hour.  Cardiac Summary The average pulse rate was 70.0 bpm.  The minimum pulse rate was 52.0 bpm while the maximum pulse rate was 93.0 bpm.  Cardiac rhythm was normal/abnormal.  Comments:  Study completed as a diagnostic study Slept elevated with 4 pillows Poor sleep efficiency with fragmentation  Diagnosis:  Mild obstructive sleep apnea with moderate oxygen desaturations AHI of 10.3, O2 nadir of 75% with oxygen below 88% for 12 minutes Poor sleep efficiency with increased wake after sleep onset Sleep fragmentation Moderate periodic limb movement Cardiac rhythm was sinus with VPCs noted  Recommendations: Options for treating mild obstructive sleep apnea may include CPAP therapy if there are significant daytime symptoms or notable comorbidities. Auto CPAP 5-15 with heated humidification and the patient's preferred mask may be considered; other treatment options may include an oral device, watchful waiting with significant weight loss efforts.   This study was personally reviewed and electronically signed by: Dr. JENNET EPLEY Accredited Board Certified in Sleep Medicine Date/Time:  07/25/24   %%endinterp%%   Diagnostic PSG Report  Patient Name: Laura Knox, Laura Knox Date: 07/22/2024  Date of Birth: [birth year] Study Type: Diagnostic  Age: 58 year MRN #: [id number]  Sex: Female Interpreting Physician: EPLEY JENNET, [id number]  Height: 5' 6 Referring Physician: JENNET EPLEY [phone])  Weight: 283.0 lbs Recording Tech: Dewane Hacker CRT RPSGT RST  BMI: 46.0 Scoring  Tech: Dewane Hacker CRT RPSGT RST  ESS: 3 Neck Size: 15.75   Study Overview  Lights Off: 09:16:15 PM  Count Index  Lights On: 03:24:47  AM Awakenings: 61 15.8  Time in Bed: 368.5 min. Arousals: 170 44.0  Total Sleep Time: 232.0 min. AHI (>=3% Desat and/or Ar.): 69 17.8   Sleep Efficiency: 63.0% AHI (>=4% Desat): 40 10.3   Sleep Latency: 8.6 min. Limb Movements: 109 28.2  Wake After Sleep Onset: 127.5 min. Snore: - -  REM Latency from Sleep Onset: 239.0 min. Desaturations: 98 25.3     Minimum SpO2 TST: 75.0%    Sleep Architecture  % of Time in Bed Stages Time (mins) % Sleep Time  Wake 137.0   Stage N1 52.5 22.6%  Stage N2 147.0 63.4%  Stage N3 11.5 5.0%  REM 21.0 9.1%   Arousal Summary   NREM REM Sleep Index  Respiratory Arousals 53 8 61 15.8  PLM Arousals 21 - 21 5.4  Isolated Limb Movement Arousals 4 - 4 1.0  Snore Arousals - - - -  Spontaneous Arousals 83 2 85 22.0  Total 160 10 170 44.0   Limb Movement Summary   Count Index  Isolated Limb Movements 14 3.6  Periodic Limb Movements (PLMs) 95 24.6  Total Limb Movements 109 28.2    Respiratory Summary   By Sleep Stage By Body Position Total   NREM REM Supine Non-Supine   Time (min) 211.0 21.0 98.5 133.5 232.0         Obstructive Apnea - - - - -  Mixed Apnea - - - - -  Central Apnea - - - - -  Total Apneas - - - - -  Total Apnea Index - - - - -         Hypopneas (>=3% Desat and/or Ar.) 44 25 46 23 69  AHI (>=3% Desat and/or Ar.) 12.5 71.4 28.0 10.3 17.8         Hypopneas (>=4% Desat) 18 22 30 10  40  AHI (>=4% Desat) 5.1 62.9 18.3 4.5 10.3          RERAs 35 2 17 20  37  RERA Index 10.0 5.7 10.4 9.0 9.6         RDI 22.5 77.1 38.4 19.3 27.4    Respiratory Event Type Index  Central Apneas -  Obstructive Apneas -  Mixed Apneas -  Central Hypopneas -  Obstructive Hypopneas 18.1  Central Apnea + Hypopnea (CAHI) -  Obstructive Apnea + Hypopnea (OAHI) 18.1   Respiratory Event Durations   Apnea Hypopnea   NREM REM NREM REM  Average (seconds) - - 20.3 24.3  Maximum (seconds) - - 35.3 40.2    Oxygen Saturation Summary   Wake NREM REM TST  TIB  Average SpO2 (%) 92.0% 91.2% 88.8% 91.0% 91.3%  Minimum SpO2 (%) 73.0% 87.0% 75.0% 75.0% 73.0%  Maximum SpO2 (%) 98.0% 98.0% 97.0% 98.0% 98.0%   Oxygen Saturation Distribution  Range (%) Time in range (min) Time in range (%)  90.0 - 100.0 260.6 71.9%  80.0 - 90.0 100.7 27.8%  70.0 - 80.0 1.4 0.4%  60.0 - 70.0 - -  50.0 - 60.0 - -  0.0 - 50.0 - -  Time Spent <=88% SpO2  Range (%) Time in range (min) Time in range (%)  0.0 - 88.0 12.3 3.4%      Count Index  Desaturations 98 25.3    Cardiac Summary   Wake NREM REM Sleep  Total  Average Pulse Rate (BPM) 73.2 67.6 75.6 68.3 70.0  Minimum Pulse Rate (BPM) 55.0 52.0 59.0 52.0 52.0  Maximum Pulse Rate (BPM) 93.0 93.0 87.0 93.0 93.0   Pulse Rate Distribution:  Range (bpm) Time in range (min) Time in range (%)  0.0 - 40.0 - -  40.0 - 60.0 52.5 14.5%  60.0 - 80.0 279.1 76.9%  80.0 - 100.0 23.4 6.4%  100.0 - 120.0 - -  120.0 - 140.0 - -  140.0 - 200.0 - -      Hypnograms                      Technologist Comments  THE 37-YEAR-OLD FEMALE PATIENT PRESENTED TO THE SLEEP DISORDER CENTER FOR A NPSG DIAGNOSTIC STUDY WITH A CHIEF COMPLAINT OF OSA. ALL BEDTIME MEDICATIONS WERE SELF ADMINISTERED AT 2000. THE DIAGNOSTIC STUDY WAS EXPLAINED, THE LEAD PLACEMENT WAS INITIATED, THEN THE STUDY WAS BEGUN. MODERATE TO LOUD AUDIBLE SNORING WAS NOTED THROUGHOUT THE STUDY. THE PATIENT WAS NOTED YELLING, STEMMING FROM PAIN DURING THE STUDY. THE PATIENT SLEPT PROPPED UP ON 4 PILLOWS DURING THE STUDY. NO OBVIOUS PARASOMNIAs WERE OBSERVED. SUPPLEMENTAL OXYGEN WAS NOT WARRANTED DURING THE STUDY. PLMs - PLMAs WERE NOTED. ONE RESTROOM VISITS WERE MADE. QUESTIONABLE CARDIAC ARRHYTHMIAS WERE OBSERVED ON EPOCHS 476, 602, 683, 710, 716, 723, 731 AND THROUGHOUT THE STUDY. THE PATIENT TOLERATED THE NPSG DIAGNOSTIC STUDY WELL.

## 2024-07-29 NOTE — Telephone Encounter (Signed)
 Called pt and there was no answer-LMTCB

## 2024-08-11 NOTE — Telephone Encounter (Signed)
 Called pt again and still no answer- left detailed msg to call back for the results, or keep planned appt 09/09/24 to review them. Closing encounter per protocol.

## 2024-08-19 ENCOUNTER — Encounter (INDEPENDENT_AMBULATORY_CARE_PROVIDER_SITE_OTHER): Payer: Self-pay | Admitting: Family Medicine

## 2024-08-19 ENCOUNTER — Ambulatory Visit (INDEPENDENT_AMBULATORY_CARE_PROVIDER_SITE_OTHER): Admitting: Family Medicine

## 2024-08-19 DIAGNOSIS — I10 Essential (primary) hypertension: Secondary | ICD-10-CM | POA: Diagnosis not present

## 2024-08-19 DIAGNOSIS — E669 Obesity, unspecified: Secondary | ICD-10-CM | POA: Diagnosis not present

## 2024-08-19 DIAGNOSIS — R7303 Prediabetes: Secondary | ICD-10-CM

## 2024-08-19 DIAGNOSIS — Z6841 Body Mass Index (BMI) 40.0 and over, adult: Secondary | ICD-10-CM | POA: Diagnosis not present

## 2024-08-19 MED ORDER — METFORMIN HCL 500 MG PO TABS: ORAL_TABLET | ORAL | 0 refills | Status: DC

## 2024-08-19 MED ORDER — PHENTERMINE HCL 37.5 MG PO CAPS: 37.5000 mg | ORAL_CAPSULE | ORAL | 0 refills | Status: DC

## 2024-08-19 MED ORDER — AMLODIPINE BESYLATE 5 MG PO TABS: 5.0000 mg | ORAL_TABLET | Freq: Every day | ORAL | 0 refills | Status: DC

## 2024-08-19 NOTE — Progress Notes (Signed)
 Office: [phone]  /  Fax: [phone]  WEIGHT SUMMARY AND BIOMETRICS  Anthropometric Measurements Height: 5' 6 (1.676 m) Weight: 284 lb (128.8 kg) BMI (Calculated): 45.86 Weight at Last Visit: 283 lb Weight Lost Since Last Visit: 0 Weight Gained Since Last Visit: 1 lb Starting Weight: 378 lb Total Weight Loss (lbs): 94 lb (42.6 kg) Peak Weight: 407 lb   Body Composition  Body Fat %: 52.7 % Fat Mass (lbs): 150 lbs Muscle Mass (lbs): 128 lbs Total Body Water  (lbs): 97 lbs Visceral Fat Rating : 19   Other Clinical Data Fasting: no Labs: no Today's Visit #: 56 Starting Date: 12/27/20    Chief Complaint: OBESITY    History of Present Illness Laura Knox is a 58 year old female with obesity, prediabetes, and hypertension who presents for obesity treatment and progress assessment.  She follows a pescetarian eating plan and practices intermittent fasting about 80% of the time. Her exercise routine includes 60 minutes of activity six days a week, focusing on cardio, strength, and balance. Despite these efforts, she has gained one pound in the last month, which she attributes to skipping meals and inadequate sleep. She initially lost five pounds after changing her fasting routine but then gained back three pounds and an additional pound recently. She has adjusted her fasting routine to ensure she takes her medication with food, using a protein shake in the morning to facilitate this.  She is currently being treated for prediabetes with metformin  500 mg twice a day and is working on reducing simple carbohydrates in her diet. Her hypertension is managed with amlodipine  5 mg daily. She requests a refill for her medication. Additionally, she is taking phentermine  37.5 mg daily to aid in weight loss and requests a refill for this as well.  She engages in various physical activities, including walking and swimming, especially when traveling. She recently participated in the  [place], which involved significant walking, and experienced leg pain that was alleviated by a massage from her partner.  She is trying to consume adequate protein, aiming for 100 grams per day, and is mindful of her protein intake distribution throughout the day.      PHYSICAL EXAM:  Blood pressure 129/81, pulse 83, temperature 98.4 F (36.9 C), height 5' 6 (1.676 m), weight 284 lb (128.8 kg), SpO2 99%. Body mass index is 45.84 kg/m.  DIAGNOSTIC DATA REVIEWED:  BMET    Component Value Date/Time   NA 140 02/02/2024 0917   K 4.2 02/02/2024 0917   CL 105 02/02/2024 0917   CO2 22 02/02/2024 0917   GLUCOSE 99 02/02/2024 0917   GLUCOSE 98 07/10/2023 1524   BUN 18 02/02/2024 0917   CREATININE 0.72 02/02/2024 0917   CALCIUM 8.7 02/02/2024 0917   GFRNONAA >60 07/10/2023 1524   GFRAA 128 02/06/2021 0000   Lab Results  Component Value Date   HGBA1C 5.7 (H) 02/02/2024   HGBA1C 6.0 (H) 12/27/2020   Lab Results  Component Value Date   INSULIN  11.6 02/02/2024   INSULIN  20.4 12/27/2020   Lab Results  Component Value Date   TSH 1.060 02/02/2024   CBC    Component Value Date/Time   WBC 8.4 09/30/2023 0915   RBC 4.75 09/30/2023 0915   HGB 13.9 09/30/2023 0915   HGB 13.7 12/27/2020 0848   HCT 42.4 09/30/2023 0915   HCT 41.2 12/27/2020 0848   PLT 360.0 09/30/2023 0915   PLT 305 12/27/2020 0848   MCV 89.2 09/30/2023  0915   MCV 87 12/27/2020 0848   MCH 28.1 07/10/2023 1524   MCHC 32.8 09/30/2023 0915   RDW 13.5 09/30/2023 0915   RDW 13.2 12/27/2020 0848   Iron Studies    Component Value Date/Time   FERRITIN 88 02/06/2021 0000   Lipid Panel     Component Value Date/Time   CHOL 139 02/02/2024 0917   TRIG 59 02/02/2024 0917   HDL 52 02/02/2024 0917   CHOLHDL 3 02/04/2023 0715   VLDL 10.6 02/04/2023 0715   LDLCALC 75 02/02/2024 0917   Hepatic Function Panel     Component Value Date/Time   PROT 6.6 02/02/2024 0917   ALBUMIN 4.0 02/02/2024 0917    AST 10 02/02/2024 0917   ALT 9 02/02/2024 0917   ALKPHOS 95 02/02/2024 0917   BILITOT 0.5 02/02/2024 0917      Component Value Date/Time   TSH 1.060 02/02/2024 0917   Nutritional Lab Results  Component Value Date   VD25OH 36.6 02/02/2024   VD25OH 30.4 10/13/2023   VD25OH 41.3 08/12/2022     Assessment and Plan Assessment & Plan Obesity Obesity management includes a pescetarian diet and intermittent fasting, adhered to 80% of the time, and an exercise regimen of 60 minutes of cardio, strength, and balance exercises six days a week. Despite these efforts, there is a weight gain of one pound in the last month. Intermittent fasting may not be effective long-term due to potential metabolic slowdown. Protein intake is monitored at 100 grams per day, but meal timing and calorie tracking need improvement. - Encourage strict calorie tracking to aid weight loss. - Consider repeating the metabolism test. - Refill phentermine  37.5 mg daily. - Journaling goal is 1300-1500 kcal per day and greater than 100 gm of protein per day, with minimal protein supplements.  Prediabetes Prediabetes is managed with metformin  500 mg twice daily. She is reducing simple carbohydrates in her diet. No issues with metformin  when taken with a protein shake in the morning, which helps avoid stomach upset. - Continue metformin  500 mg twice daily. - Encourage reduction of simple carbohydrates in diet.  Essential hypertension Hypertension is well-controlled with amlodipine  5 mg daily. Blood pressure is 129/81 mmHg. - Continue amlodipine  5 mg daily. - Refill amlodipine  prescription.      Laura Knox was informed of the importance of frequent follow up visits to maximize her success with intensive lifestyle modifications for her obesity and obesity related health conditions as recommended by USPSTF and CMS guidelines   Louann Penton, MD

## 2024-08-21 ENCOUNTER — Other Ambulatory Visit (INDEPENDENT_AMBULATORY_CARE_PROVIDER_SITE_OTHER): Payer: Self-pay | Admitting: Family Medicine

## 2024-08-21 ENCOUNTER — Other Ambulatory Visit: Payer: Self-pay | Admitting: Medical

## 2024-08-21 DIAGNOSIS — I10 Essential (primary) hypertension: Secondary | ICD-10-CM

## 2024-08-22 ENCOUNTER — Encounter: Payer: Self-pay | Admitting: Orthopaedic Surgery

## 2024-08-23 NOTE — Telephone Encounter (Signed)
Approve.  Thanks.

## 2024-08-27 ENCOUNTER — Telehealth: Payer: Self-pay

## 2024-08-27 ENCOUNTER — Ambulatory Visit
Admission: EM | Admit: 2024-08-27 | Discharge: 2024-08-27 | Disposition: A | Discharge status: Home / Self Care | Attending: Family Medicine | Admitting: Family Medicine

## 2024-08-27 DIAGNOSIS — A749 Chlamydial infection, unspecified: Secondary | ICD-10-CM | POA: Diagnosis not present

## 2024-08-27 MED ORDER — DOXYCYCLINE HYCLATE 100 MG PO CAPS: 100.0000 mg | ORAL_CAPSULE | Freq: Two times a day (BID) | ORAL | 0 refills | Status: AC

## 2024-08-27 NOTE — Telephone Encounter (Signed)
 Pt called reporting she received a test from [facility] and was positive for an STI she is seeking treatment. Asked patient to be evaluated and got permission to request fax of her results to the office. Pt agreed to this request.

## 2024-08-27 NOTE — ED Provider Notes (Signed)
 Laura Knox    CSN: [id number] Arrival date & time: 08/27/24  1306      History   Chief Complaint Chief Complaint  Patient presents with   STI Treatment     HPI Laura Knox is a 58 y.o. female.   HPI Pleasant 58 year old-year-old female presents with positive chlamydia test from [facility] today and request treatment for this STD.  Past Medical History:  Diagnosis Date   B12 deficiency    Bilateral swelling of feet and ankles    Complication of anesthesia    woke up feeling sore head to toe.  Did not have any issuses with anesthesia in January 2024.   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   Depression 12/24/2022   BMI 40.0-44.9, adult (HCC) 12/24/2022   Obesity, Beginning BMI 61.01 12/24/2022   Status post total left knee replacement 11/22/2022   Primary osteoarthritis of right knee 11/21/2022   Left knee pain 10/14/2022   Achilles tendinitis 03/21/2022   Pre-diabetes 02/27/2022   Vitamin D  deficiency 02/27/2022   At risk for diabetes mellitus 02/27/2022   Plantar fasciitis of right foot 01/08/2022   Special screening for malignant neoplasms, colon    Rectal polyp    Essential hypertension 01/29/2021   Class 3 severe obesity with serious comorbidity and body mass index (BMI) of 60.0 to 69.9 in adult (HCC) 01/29/2021   Decreased functional activity tolerance 07/24/2020   Cervical spondylosis 07/24/2020   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   OSA (obstructive  sleep apnea) 07/20/2015   Arthritis 01/06/2014    Past Surgical History:  Procedure Laterality Date   COLONOSCOPY WITH PROPOFOL  N/A 04/10/2021   Procedure: COLONOSCOPY WITH PROPOFOL ;  Surgeon: Charlanne Groom, MD;  Location: [facility];  Service: Endoscopy;  Laterality: N/A;   JOINT REPLACEMENT     KIDNEY STONE SURGERY     miniscus repair Left    POLYPECTOMY  04/10/2021   Procedure: POLYPECTOMY;  Surgeon: Charlanne Groom, MD;  Location: [facility];  Service: Endoscopy;;   SEPTOPLASTY     TOTAL KNEE ARTHROPLASTY Left 11/22/2022   Procedure: LEFT TOTAL KNEE REPLACEMENT;  Surgeon: Jerri Kay HERO, MD;  Location: [facility] OR;  Service: Orthopedics;  Laterality: Left;   TOTAL KNEE ARTHROPLASTY Right 07/21/2023   Procedure: RIGHT TOTAL KNEE REPLACEMENT;  Surgeon: Jerri Kay HERO, MD;  Location: [facility] OR;  Service: Orthopedics;  Laterality: Right;    OB History     Gravida  0   Para  0   Term  0   Preterm  0   AB  0   Living  0      SAB  0   IAB  0   Ectopic  0   Multiple  0   Live Births  0            Home Medications  Prior to Admission medications   Medication Sig Start Date End Date Taking? Authorizing Provider  doxycycline  (VIBRAMYCIN ) 100 MG capsule Take 1 capsule (100 mg total) by mouth 2 (two) times daily for 7 days. 08/27/24 09/03/24 Yes Teddy Sharper, FNP  amLODipine  (NORVASC ) 5 MG tablet Take 1 tablet (5 mg total) by mouth daily. 08/19/24   Verdon Parry D, MD  Ascorbic Acid (VITAMIN C) 1000 MG tablet Take 1,000 mg by mouth daily.    [provider]  cetirizine (ZYRTEC) 10 MG tablet Take 10 mg by mouth daily.    [provider]  estradiol  (VIVELLE -DOT) 0.1 MG/24HR patch PLACE 1 PATCH (0.1 MG TOTAL) ONTO THE SKIN 2 (TWO) TIMES A WEEK. 08/23/24   Saguier, [place], PA-C  levothyroxine  (SYNTHROID ) 100 MCG tablet TAKE 1 TABLET BY MOUTH EVERY DAY 06/07/24   Saguier, [place], PA-C  losartan  (COZAAR ) 100 MG tablet TAKE 1 TABLET BY MOUTH EVERYDAY AT  BEDTIME 05/10/24   Saguier, [place], PA-C  metFORMIN  (GLUCOPHAGE ) 500 MG tablet 2 po with lunch qd 08/19/24   Beasley, Caren D, MD  Multiple Vitamins-Minerals (HAIR/SKIN/NAILS/BIOTIN PO) Take 2 tablets by mouth daily.    [provider]  phentermine  37.5 MG capsule Take 1 capsule (37.5 mg total) by mouth every morning. 08/19/24   Verdon Parry D, MD  progesterone  (PROMETRIUM ) 100 MG capsule Take 1 capsule (100 mg total) by mouth at bedtime. 05/19/24   Erik Kieth BROCKS, MD    Family History Family History  Problem Relation Age of Onset   Healthy Mother    Obesity Mother    Cancer Father    Obesity Father    Cancer - Other Father    Stroke Maternal Grandmother    Lung cancer Maternal Grandfather     Social History Social History   Tobacco Use   Smoking status: Never   Smokeless tobacco: Never  Vaping Use   Vaping status: Never Used  Substance Use Topics   Alcohol use: Not Currently   Drug use: Never     Allergies   Chlorthalidone    Review of Systems Review of Systems   Physical Exam Triage Vital Signs ED Triage Vitals [08/27/24 1316]  Encounter Vitals Group     BP (!) 157/97     Girls Systolic BP Percentile      Girls Diastolic BP Percentile      Boys Systolic BP Percentile      Boys Diastolic BP Percentile      Pulse Rate 94     Resp 17     Temp 98.5 F (36.9 C)     Temp Source Oral     SpO2 99 %     Weight      Height      Head Circumference      Peak Flow      Pain Score 0     Pain Loc      Pain Education      Exclude from Growth Chart    No data found.  Updated Vital Signs BP (!) 157/97 (BP Location: Right Arm)   Pulse 94   Temp 98.5 F (36.9 C) (Oral)   Resp 17   SpO2 99%   Visual Acuity Right Eye Distance:   Left Eye Distance:   Bilateral Distance:    Right Eye Near:   Left Eye Near:    Bilateral Near:     Physical Exam Vitals and nursing note reviewed.  Constitutional:      Appearance:  Normal appearance. She is  normal weight.  HENT:     Head: Normocephalic and atraumatic.     Mouth/Throat:     Mouth: Mucous membranes are moist.     Pharynx: Oropharynx is clear.  Eyes:     Extraocular Movements: Extraocular movements intact.     Pupils: Pupils are equal, round, and reactive to light.  Cardiovascular:     Rate and Rhythm: Normal rate and regular rhythm.     Pulses: Normal pulses.     Heart sounds: Normal heart sounds.  Pulmonary:     Effort: Pulmonary effort is normal.     Breath sounds: Normal breath sounds. No wheezing, rhonchi or rales.  Musculoskeletal:        General: Normal range of motion.  Skin:    General: Skin is warm and dry.  Neurological:     General: No focal deficit present.     Mental Status: She is alert and oriented to person, place, and time. Mental status is at baseline.  Psychiatric:        Mood and Affect: Mood normal.      UC Treatments / Results  Labs (all labs ordered are listed, but only abnormal results are displayed) Labs Reviewed - No data to display  EKG   Radiology No results found.  Procedures Procedures (including critical Knox time)  Medications Ordered in UC Medications - No data to display  Initial Impression / Assessment and Plan / UC Course  I have reviewed the triage vital signs and the nursing notes.  Pertinent labs & imaging results that were available during my Knox of the patient were reviewed by me and considered in my medical decision making (see chart for details).     MDM: 1.  Chlamydia infection-Rx'd doxycycline  100 mg capsule: Take 1 capsule twice daily x 7 days. Advised patient to discontinue/hold multivitamins/minerals for the next 7 days while taking this antibiotic.  Advised patient take medication as directed with food to completion.  Encouraged to increase daily water  intake to 64 ounces per day while taking this medication.  Advised if symptoms worsen and/or unresolved please follow-up with your PCP or here for  further evaluation.  Patient discharged home, hemodynamically stable.  Final Clinical Impressions(s) / UC Diagnoses   Final diagnoses:  Chlamydia infection     Discharge Instructions      Advised patient to discontinue/hold multivitamins/minerals for the next 7 days while taking this antibiotic.  Advised patient take medication as directed with food to completion.  Encouraged to increase daily water  intake to 64 ounces per day while taking this medication.  Advised if symptoms worsen and/or unresolved please follow-up with your PCP or here for further evaluation.     ED Prescriptions     Medication Sig Dispense Auth. Provider   doxycycline  (VIBRAMYCIN ) 100 MG capsule Take 1 capsule (100 mg total) by mouth 2 (two) times daily for 7 days. 14 capsule Akansha Wyche, FNP      PDMP not reviewed this encounter.   Teddy Sharper, FNP 08/27/24 1408

## 2024-08-27 NOTE — ED Triage Notes (Signed)
 Pt here for STI treatment. Had independent testing done through [facility] and was told today she tested pos for Chlamydia.

## 2024-08-27 NOTE — Discharge Instructions (Addendum)
 Advised patient to discontinue/hold multivitamins/minerals for the next 7 days while taking this antibiotic.  Advised patient take medication as directed with food to completion.  Encouraged to increase daily water  intake to 64 ounces per day while taking this medication.  Advised if symptoms worsen and/or unresolved please follow-up with your PCP or here for further evaluation.

## 2024-08-28 ENCOUNTER — Encounter (INDEPENDENT_AMBULATORY_CARE_PROVIDER_SITE_OTHER): Payer: Self-pay | Admitting: Family Medicine

## 2024-08-30 ENCOUNTER — Other Ambulatory Visit: Payer: Self-pay | Admitting: Physician Assistant

## 2024-08-30 ENCOUNTER — Encounter: Payer: Self-pay | Admitting: Radiology

## 2024-08-30 MED ORDER — METHOCARBAMOL 500 MG PO TABS
500.0000 mg | ORAL_TABLET | Freq: Every day | ORAL | 0 refills | Status: AC | PRN
Start: 2024-08-30 — End: ?

## 2024-08-30 NOTE — Telephone Encounter (Signed)
 Looks like xu approved it to be sent, but it does not look like anyone sent in.  I will send in one small rx for this, but I do not feel she needs muscle relaxers this far out from surgery so this will be last refill I can send in

## 2024-09-03 ENCOUNTER — Telehealth: Admitting: Physician Assistant

## 2024-09-03 DIAGNOSIS — N950 Postmenopausal bleeding: Secondary | ICD-10-CM

## 2024-09-03 NOTE — Patient Instructions (Signed)
  Tobias Theoplis Courts, thank you for joining Delon CHRISTELLA Dickinson, PA-C for today's virtual visit.  While this provider is not your primary care provider (PCP), if your PCP is located in our provider database this encounter information will be shared with them immediately following your visit.   A [facility] MyChart account gives you access to today's visit and all your visits, tests, and labs performed at [facility]  click here if you don't have a [facility] MyChart account or go to [email or web address]  Consent: (Patient) Laura Knox provided verbal consent for this virtual visit at the beginning of the encounter.  Current Medications:  Current Outpatient Medications:    methocarbamol  (ROBAXIN ) 500 MG tablet, Take 1 tablet (500 mg total) by mouth daily as needed., Disp: 10 tablet, Rfl: 0   amLODipine  (NORVASC ) 5 MG tablet, Take 1 tablet (5 mg total) by mouth daily., Disp: 30 tablet, Rfl: 0   Ascorbic Acid (VITAMIN C) 1000 MG tablet, Take 1,000 mg by mouth daily., Disp: , Rfl:    cetirizine (ZYRTEC) 10 MG tablet, Take 10 mg by mouth daily., Disp: , Rfl:    doxycycline  (VIBRAMYCIN ) 100 MG capsule, Take 1 capsule (100 mg total) by mouth 2 (two) times daily for 7 days., Disp: 14 capsule, Rfl: 0   estradiol  (VIVELLE -DOT) 0.1 MG/24HR patch, PLACE 1 PATCH (0.1 MG TOTAL) ONTO THE SKIN 2 (TWO) TIMES A WEEK., Disp: 8 patch, Rfl: 1   levothyroxine  (SYNTHROID ) 100 MCG tablet, TAKE 1 TABLET BY MOUTH EVERY DAY, Disp: 30 tablet, Rfl: 5   losartan  (COZAAR ) 100 MG tablet, TAKE 1 TABLET BY MOUTH EVERYDAY AT BEDTIME, Disp: 30 tablet, Rfl: 5   metFORMIN  (GLUCOPHAGE ) 500 MG tablet, 2 po with lunch qd, Disp: 60 tablet, Rfl: 0   Multiple Vitamins-Minerals (HAIR/SKIN/NAILS/BIOTIN PO), Take 2 tablets by mouth daily., Disp: , Rfl:    phentermine  37.5 MG capsule, Take 1 capsule (37.5 mg total) by mouth every morning., Disp: 30 capsule, Rfl: 0   progesterone  (PROMETRIUM ) 100 MG capsule, Take 1  capsule (100 mg total) by mouth at bedtime., Disp: 90 capsule, Rfl: 3   Medications ordered in this encounter:  No orders of the defined types were placed in this encounter.    *If you need refills on other medications prior to your next appointment, please contact your pharmacy*  Follow-Up: Call back or seek an in-person evaluation if the symptoms worsen or if the condition fails to improve as anticipated.  [facility] [phone]   If you have been instructed to have an in-person evaluation today at a local Urgent Care facility, please use the link below. It will take you to a list of all of our available [facility], including address, phone number and hours of operation. Please do not delay care.  [facility]  If you or a family member do not have a primary care provider, use the link below to schedule a visit and establish care. When you choose a [facility] primary care physician or advanced practice provider, you gain a long-term partner in health. Find a Primary Care Provider  Learn more about [facility]'s in-office and virtual care options: [facility] - Get Care Now

## 2024-09-03 NOTE — Progress Notes (Signed)
 Virtual Visit Consent   Laura Knox, you are scheduled for a virtual visit with a [facility] provider today. Just as with appointments in the office, your consent must be obtained to participate. Your consent will be active for this visit and any virtual visit you may have with one of our providers in the next 365 days. If you have a MyChart account, a copy of this consent can be sent to you electronically.  As this is a virtual visit, video technology does not allow for your provider to perform a traditional examination. This may limit your provider's ability to fully assess your condition. If your provider identifies any concerns that need to be evaluated in person or the need to arrange testing (such as labs, EKG, etc.), we will make arrangements to do so. Although advances in technology are sophisticated, we cannot ensure that it will always work on either your end or our end. If the connection with a video visit is poor, the visit may have to be switched to a telephone visit. With either a video or telephone visit, we are not always able to ensure that we have a secure connection.  By engaging in this virtual visit, you consent to the provision of healthcare and authorize for your insurance to be billed (if applicable) for the services provided during this visit. Depending on your insurance coverage, you may receive a charge related to this service.  I need to obtain your verbal consent now. Are you willing to proceed with your visit today? Avira Tillison has provided verbal consent on 09/03/2024 for a virtual visit (video or telephone). Laura CHRISTELLA Dickinson, PA-C  Date: 09/03/2024 8:23 AM   Virtual Visit via Video Note   I, Laura Knox, connected with  Laura Knox  ([id number], 08/26/1966) on 09/03/24 at  7:45 AM EST by a video-enabled telemedicine application and verified that I am speaking with the correct person using two identifiers.  Location: Patient: Virtual Visit  Location Patient: Home Provider: Virtual Visit Location Provider: Home Office   I discussed the limitations of evaluation and management by telemedicine and the availability of in person appointments. The patient expressed understanding and agreed to proceed.    History of Present Illness: Laura Knox is a 58 y.o. who identifies as a female who was assigned female at birth, and is being seen today for vaginal spotting. Recently diagnosed with Chlamydia. Had no symptoms prior, just had a notification from a partner to be tested. She was seen and tested on 08/27/24.  Once starting the antibiotic, she did notice that she began spotting a light pink discharge. It had been consistent and on the toilet paper when she wiped. Yesterday it became a darker red and increased in amount, but still no more than she would classify as spotting still.   This morning it is lighter again, but still present.   She is postmenopausal. She denies any pain or other discharge.    Problems:  Patient Active Problem List   Diagnosis Date Noted   DOE (dyspnea on exertion) 03/01/2024   Polyphagia 03/01/2024   Hypothyroidism 02/02/2024   Other constipation 08/12/2023   Status post total right knee replacement 07/21/2023   Depression 12/24/2022   BMI 40.0-44.9, adult (HCC) 12/24/2022   Obesity, Beginning BMI 61.01 12/24/2022   Status post total left knee replacement 11/22/2022   Primary osteoarthritis of right knee 11/21/2022   Left knee pain 10/14/2022   Achilles tendinitis 03/21/2022   Pre-diabetes  02/27/2022   Vitamin D  deficiency 02/27/2022   At risk for diabetes mellitus 02/27/2022   Plantar fasciitis of right foot 01/08/2022   Special screening for malignant neoplasms, colon    Rectal polyp    Essential hypertension 01/29/2021   Class 3 severe obesity with serious comorbidity and body mass index (BMI) of 60.0 to 69.9 in adult (HCC) 01/29/2021   Decreased functional activity tolerance 07/24/2020    Cervical spondylosis 07/24/2020   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   OSA (obstructive sleep apnea) 07/20/2015   Arthritis 01/06/2014    Allergies:  Allergies  Allergen Reactions   Chlorthalidone  Other (See Comments)    Bottomed out potassium    Medications:  Current Outpatient Medications:    methocarbamol  (ROBAXIN ) 500 MG tablet, Take 1 tablet (500 mg total) by mouth daily as needed., Disp: 10 tablet, Rfl: 0   amLODipine  (NORVASC ) 5 MG tablet, Take 1 tablet (5 mg total) by mouth daily., Disp: 30 tablet, Rfl: 0   Ascorbic Acid (VITAMIN C) 1000 MG tablet, Take 1,000 mg by mouth daily., Disp: , Rfl:    cetirizine (ZYRTEC) 10 MG tablet, Take 10 mg by mouth daily., Disp: , Rfl:    doxycycline  (VIBRAMYCIN ) 100 MG capsule, Take 1 capsule (100 mg total) by mouth 2 (two) times daily for 7 days., Disp: 14 capsule, Rfl: 0   estradiol  (VIVELLE -DOT) 0.1 MG/24HR patch, PLACE 1 PATCH (0.1 MG TOTAL) ONTO THE SKIN 2 (TWO) TIMES A WEEK., Disp: 8 patch, Rfl: 1   levothyroxine  (SYNTHROID ) 100 MCG tablet, TAKE 1 TABLET BY MOUTH EVERY DAY, Disp: 30 tablet, Rfl: 5   losartan  (COZAAR ) 100 MG tablet, TAKE 1 TABLET BY MOUTH EVERYDAY AT BEDTIME, Disp: 30 tablet, Rfl: 5   metFORMIN  (GLUCOPHAGE ) 500 MG tablet, 2 po with lunch qd, Disp: 60 tablet, Rfl: 0   Multiple Vitamins-Minerals (HAIR/SKIN/NAILS/BIOTIN PO), Take 2 tablets by mouth daily., Disp: , Rfl:    phentermine  37.5 MG capsule, Take 1 capsule (37.5 mg total) by mouth every morning., Disp: 30 capsule, Rfl: 0   progesterone  (PROMETRIUM ) 100 MG capsule, Take 1 capsule (100 mg total) by mouth at bedtime., Disp: 90 capsule, Rfl: 3  Observations/Objective: Patient is well-developed, well-nourished in no acute distress.  Resting comfortably at home.  Head is normocephalic, atraumatic.  No labored breathing.  Speech is clear and coherent with logical content.  Patient is alert and oriented at baseline.    Assessment and Plan: 1. Postmenopausal bleeding  (Primary)  - Advised limitations with virtual appt for this issue and advised for her to call her OB/GYN for an appt. If they cannot see her she should follow up in person at a local UC for evaluation.   Follow Up Instructions: I discussed the assessment and treatment plan with the patient. The patient was provided an opportunity to ask questions and all were answered. The patient agreed with the plan and demonstrated an understanding of the instructions.  A copy of instructions were sent to the patient via MyChart unless otherwise noted below.    The patient was advised to call back or seek an in-person evaluation if the symptoms worsen or if the condition fails to improve as anticipated.    Laura CHRISTELLA Dickinson, PA-C

## 2024-09-06 ENCOUNTER — Other Ambulatory Visit (HOSPITAL_COMMUNITY)
Admission: RE | Admit: 2024-09-06 | Discharge: 2024-09-06 | Disposition: A | Discharge status: Home / Self Care | Source: Ambulatory Visit | Attending: Obstetrics & Gynecology | Admitting: Obstetrics & Gynecology

## 2024-09-06 ENCOUNTER — Ambulatory Visit: Admitting: Obstetrics & Gynecology

## 2024-09-06 ENCOUNTER — Encounter: Payer: Self-pay | Admitting: Obstetrics & Gynecology

## 2024-09-06 VITALS — BP 137/88 | HR 78 | Ht 66.0 in | Wt 286.0 lb

## 2024-09-06 DIAGNOSIS — Z202 Contact with and (suspected) exposure to infections with a predominantly sexual mode of transmission: Secondary | ICD-10-CM | POA: Diagnosis not present

## 2024-09-06 DIAGNOSIS — N950 Postmenopausal bleeding: Secondary | ICD-10-CM

## 2024-09-06 DIAGNOSIS — N939 Abnormal uterine and vaginal bleeding, unspecified: Secondary | ICD-10-CM | POA: Insufficient documentation

## 2024-09-06 NOTE — Progress Notes (Signed)
   Subjective:    Patient ID: Laura Knox, female    DOB: [birth year], 58 y.o.   MRN: [id number]  HPI  58 yo female with recent diagnosis of chlamydia and was treated.   Bleeding started on Nov 3rd after started taking doxy on 10/31.  Bleeding has varied from pink/lihgt, BRB.  No pain.  She denies other episodes of PMB.  Denies vaginal discharge.  Pt is on Vivelle  dot and prometrium  and has not skipped doses.    Review of Systems  Constitutional: Negative.   Respiratory: Negative.    Cardiovascular: Negative.   Gastrointestinal: Negative.   Genitourinary:  Positive for vaginal bleeding. Negative for vaginal discharge.       Objective:   Physical Exam Vitals reviewed.  Constitutional:      General: She is not in acute distress.    Appearance: She is well-developed.  HENT:     Head: Normocephalic and atraumatic.  Eyes:     Conjunctiva/sclera: Conjunctivae normal.  Cardiovascular:     Rate and Rhythm: Normal rate.  Pulmonary:     Effort: Pulmonary effort is normal.  Genitourinary:    Comments: Tanner V Vulva:  No lesion Vagina:  Pale pink, no lesions, no discharge, small amount of blood Cervix:  No CMT,  Uterus:  Non tender, mobile Skin:    General: Skin is warm and dry.  Neurological:     Mental Status: She is alert and oriented to person, place, and time.  Psychiatric:        Mood and Affect: Mood normal.    Vitals:   09/06/24 1441  BP: 137/88  Pulse: 78  Weight: 286 lb (129.7 kg)  Height: 5' 6 (1.676 m)      Assessment & Plan:   58 yo female with PMB and recent dx of chlamydia and treatment  Get full STI panel Pelvic US  complete Endometrial biopsy today -- see note below.  Unable to complete due to patient discomfort.   ENDOMETRIAL BIOPSY     The indications for endometrial biopsy were reviewed.   Risks of the biopsy including cramping, bleeding, infection, uterine perforation, inadequate specimen and need for additional procedures  were discussed.  The patient states she understands and agrees to undergo procedure today. Consent was signed. Time out was performed. Pt is post menopausal. A sterile speculum was placed in the patient's vagina and the cervix was prepped with Betadine . A single-toothed tenaculum was placed on the anterior lip of the cervix to stabilize it. The 3 mm pipelle was introduced into the endometrial cavity without difficulty to a depth of 4 cm.  Pt was unable to tolerate further insertion of the pipelle and procedure was stopped.  . The instruments were removed from the patient's vagina. Minimal bleeding from the cervix was noted. Will get pelvi US  complete with TVUS to determine next steps.

## 2024-09-07 ENCOUNTER — Ambulatory Visit (INDEPENDENT_AMBULATORY_CARE_PROVIDER_SITE_OTHER)

## 2024-09-07 DIAGNOSIS — N939 Abnormal uterine and vaginal bleeding, unspecified: Secondary | ICD-10-CM

## 2024-09-07 DIAGNOSIS — N83292 Other ovarian cyst, left side: Secondary | ICD-10-CM | POA: Diagnosis not present

## 2024-09-07 DIAGNOSIS — N888 Other specified noninflammatory disorders of cervix uteri: Secondary | ICD-10-CM | POA: Diagnosis not present

## 2024-09-07 LAB — CERVICOVAGINAL ANCILLARY ONLY
Bacterial Vaginitis (gardnerella): NEGATIVE
Candida Glabrata: NEGATIVE
Candida Vaginitis: NEGATIVE
Chlamydia: NEGATIVE
Comment: NEGATIVE
Comment: NEGATIVE
Comment: NEGATIVE
Comment: NEGATIVE
Comment: NEGATIVE
Comment: NORMAL
Neisseria Gonorrhea: NEGATIVE
Trichomonas: NEGATIVE

## 2024-09-09 ENCOUNTER — Encounter: Payer: Self-pay | Admitting: Pulmonary Disease

## 2024-09-09 ENCOUNTER — Ambulatory Visit: Admitting: Pulmonary Disease

## 2024-09-09 VITALS — BP 136/84 | HR 94 | Ht 66.0 in | Wt 288.0 lb

## 2024-09-09 DIAGNOSIS — G4733 Obstructive sleep apnea (adult) (pediatric): Secondary | ICD-10-CM

## 2024-09-09 LAB — RPR+HBSAG+HCVAB+...
HIV Screen 4th Generation wRfx: NONREACTIVE
Hep C Virus Ab: NONREACTIVE
Hepatitis B Surface Ag: NEGATIVE
RPR Ser Ql: NONREACTIVE

## 2024-09-09 NOTE — Patient Instructions (Addendum)
 I will see you in about 4 months  Continue to follow-up with the weight loss center  Your sleep study shows mild obstructive sleep apnea with moderate oxygen desaturations  Consideration for GLP-1 medications may help move the needle forward  If there is interest in trying to treat the mild sleep apnea then an oral device may be an option of treatment-this will mean we will refer you to a dentist to fashion an oral device  Call us  with concerns

## 2024-09-09 NOTE — Progress Notes (Signed)
 Laura Knox    [id number]    1966-02-03  Primary Care Physician:[place], [place] RIGGERS  Referring Physician: Dorina [place], PA-C [id number] [place],  [place] [address]  Chief complaint:   Patient being seen for concern for sleep disordered breathing In for follow-up HPI:  Recently had a sleep study showing mild obstructive sleep apnea with mild oxygen desaturations  Has had multiple sleep studies previously, diagnosed with sleep apnea, treated with CPAP Did not tolerate CPAP well Not willing to go back to using CPAP  She was informed about study results and came in for follow-up today  She continues to work with the bariatric program and has lost about 100 pounds since the last time she was here She continues to feel better overall  Weight loss has plateaued despite being more active and on phentermine   Consideration for GLP-1/GIP  She does have a history of sleep apnea History of snoring She does sleep in a reclining bed, goes to bed between 10 and 11 PM Falls asleep quickly 2-3 awakenings Final wake up time between 6 and 630 Wakes up feeling rested on most days Memory is good No significant dryness of the mouth in the morning, no headaches, does have night sweats She feels her sleep quality is good overall Does have a history of hypertension  She feels she is claustrophobic and not able to tolerate any mask over her face  She is not very sleepy during the day  Outpatient Encounter Medications as of 09/09/2024  Medication Sig   amLODipine  (NORVASC ) 5 MG tablet Take 1 tablet (5 mg total) by mouth daily.   Ascorbic Acid (VITAMIN C) 1000 MG tablet Take 1,000 mg by mouth daily.   cetirizine (ZYRTEC) 10 MG tablet Take 10 mg by mouth daily.   estradiol  (VIVELLE -DOT) 0.1 MG/24HR patch PLACE 1 PATCH (0.1 MG TOTAL) ONTO THE SKIN 2 (TWO) TIMES A WEEK.   levothyroxine  (SYNTHROID ) 100 MCG tablet TAKE 1 TABLET BY MOUTH EVERY DAY   losartan   (COZAAR ) 100 MG tablet TAKE 1 TABLET BY MOUTH EVERYDAY AT BEDTIME   metFORMIN  (GLUCOPHAGE ) 500 MG tablet 2 po with lunch qd   methocarbamol  (ROBAXIN ) 500 MG tablet Take 1 tablet (500 mg total) by mouth daily as needed.   Multiple Vitamins-Minerals (HAIR/SKIN/NAILS/BIOTIN PO) Take 2 tablets by mouth daily.   phentermine  37.5 MG capsule Take 1 capsule (37.5 mg total) by mouth every morning.   progesterone  (PROMETRIUM ) 100 MG capsule Take 1 capsule (100 mg total) by mouth at bedtime.   No facility-administered encounter medications on file as of 09/09/2024.    Allergies as of 09/09/2024 - Review Complete 09/09/2024  Allergen Reaction Noted   Chlorthalidone  Other (See Comments) 07/01/2023    Past Medical History:  Diagnosis Date   B12 deficiency    Bilateral swelling of feet and ankles    Complication of anesthesia    woke up feeling sore head to toe.  Did not have any issuses with anesthesia in January 2024.   Family history of adverse reaction to anesthesia    mother - nausea   History of kidney stones    Hypertension    Hypothyroidism    Joint pain    Knee pain    Osteoarthritis    Other fatigue    Pre-diabetes    Prediabetes    Sleep apnea    does not use CPAP  Thyroid  disease    Vitamin D  deficiency     Past Surgical History:  Procedure Laterality Date   COLONOSCOPY WITH PROPOFOL  N/A 04/10/2021   Procedure: COLONOSCOPY WITH PROPOFOL ;  Surgeon: Charlanne Groom, MD;  Location: [facility];  Service: Endoscopy;  Laterality: N/A;   JOINT REPLACEMENT     KIDNEY STONE SURGERY     miniscus repair Left    POLYPECTOMY  04/10/2021   Procedure: POLYPECTOMY;  Surgeon: Charlanne Groom, MD;  Location: [facility];  Service: Endoscopy;;   SEPTOPLASTY     TOTAL KNEE ARTHROPLASTY Left 11/22/2022   Procedure: LEFT TOTAL KNEE REPLACEMENT;  Surgeon: Jerri Kay HERO, MD;  Location: [facility] OR;  Service: Orthopedics;  Laterality: Left;   TOTAL KNEE ARTHROPLASTY Right 07/21/2023   Procedure:  RIGHT TOTAL KNEE REPLACEMENT;  Surgeon: Jerri Kay HERO, MD;  Location: [facility] OR;  Service: Orthopedics;  Laterality: Right;    Family History  Problem Relation Age of Onset   Healthy Mother    Obesity Mother    Cancer Father    Obesity Father    Cancer - Other Father    Stroke Maternal Grandmother    Lung cancer Maternal Grandfather     Social History   Socioeconomic History   Marital status: Legally Separated    Spouse name: Prentice   Number of children: 0   Years of education: Not on file   Highest education level: Not on file  Occupational History   Occupation: Retired  Tobacco Use   Smoking status: Never   Smokeless tobacco: Never  Vaping Use   Vaping status: Never Used  Substance and Sexual Activity   Alcohol use: Not Currently   Drug use: Never   Sexual activity: Yes    Partners: Male  Other Topics Concern   Not on file  Social History Narrative   Not on file   Social Drivers of Health   Financial Resource Strain: Low Risk  (03/17/2022)   Received from [facility]   Overall Financial Resource Strain (CARDIA)    Difficulty of Paying Living Expenses: Not hard at all  Food Insecurity: No Food Insecurity (03/17/2022)   Received from [facility]   Hunger Vital Sign    Within the past 12 months, you worried that your food would run out before you got the money to buy more.: Never true    Within the past 12 months, the food you bought just didn't last and you didn't have money to get more.: Never true  Transportation Needs: Not on file  Physical Activity: Sufficiently Active (03/17/2022)   Received from [facility]   Exercise Vital Sign    On average, how many days per week do you engage in moderate to strenuous exercise (like a brisk walk)?: 5 days    On average, how many minutes do you engage in exercise at this level?: 60 min  Stress: No Stress Concern Present (03/17/2022)   Received from [facility] Occup[facility] - Occupational  Stress Questionnaire    Feeling of Stress : Not at all  Social Connections: Socially Integrated (03/17/2022)   Received from [facility]   Social Network    How would you rate your social network (family, work, friends)?: Good participation with social networks  Intimate Partner Violence: Not At Risk (03/17/2022)   Received from [facility]   HITS    Over the last 12 months how often did your partner physically hurt you?: Never  Over the last 12 months how often did your partner insult you or talk down to you?: Never    Over the last 12 months how often did your partner threaten you with physical harm?: Never    Over the last 12 months how often did your partner scream or curse at you?: Never    Review of Systems  Respiratory:  Positive for apnea. Negative for shortness of breath.   Psychiatric/Behavioral:  Positive for sleep disturbance.     Vitals:   09/09/24 1601  BP: 136/84  Pulse: 94  SpO2: 97%     Physical Exam Constitutional:      Appearance: She is obese.  HENT:     Head: Normocephalic.     Nose: Nose normal.  Eyes:     General: No scleral icterus.    Pupils: Pupils are equal, round, and reactive to light.  Cardiovascular:     Rate and Rhythm: Normal rate and regular rhythm.     Heart sounds: No murmur heard.    No friction rub.  Pulmonary:     Effort: No respiratory distress.     Breath sounds: No stridor. No wheezing or rhonchi.  Musculoskeletal:     Cervical back: No rigidity or tenderness.  Neurological:     Mental Status: She is alert.  Psychiatric:        Mood and Affect: Mood normal.    Data Reviewed: Previous records reviewed  Sleep study shows mild obstructive sleep apnea with AHI of 10.3, O2 nadir of 75% with oxygen below 88% for 12 minutes   Assessment/Plan: Past history of obstructive sleep apnea - Current study with mild obstructive sleep apnea with moderate oxygen desaturations   Class III obesity - With significant weight  loss -Decrease metabolism  Hypothyroidism - On Synthroid   Continues on phentermine  for weight management  She will continue to follow-up with the bariatric center  She can be considered for GLP-1/GIP medication  Referral to dental sleep medicine for an oral device was also discussed as an option of treatment and mild sleep apnea  Follow-up in about 4 months   Jennet Epley MD [facility] 09/09/2024, 4:36 PM  CC: Dorina Loving, PA-C

## 2024-09-10 ENCOUNTER — Ambulatory Visit: Payer: Self-pay | Admitting: Obstetrics & Gynecology

## 2024-09-11 DIAGNOSIS — R1084 Generalized abdominal pain: Secondary | ICD-10-CM | POA: Diagnosis not present

## 2024-09-15 ENCOUNTER — Telehealth: Payer: Self-pay | Admitting: *Deleted

## 2024-09-15 NOTE — Telephone Encounter (Signed)
Left patient a message to call and schedule. 

## 2024-09-15 NOTE — Telephone Encounter (Signed)
-----   Message from Kieth JAYSON Carolin sent at 09/13/2024  2:52 PM EST ----- Looks like pt requested a pre op appt in a mychart message, so I think she can get scheduled with either of us . ----- Message ----- From: Cleatus Moccasin, MD Sent: 09/13/2024  11:41 AM EST To: Burnard VEAR Pate, MD; Kieth JAYSON Carolin, MD  I think if medicine cleared her (due to her other issues), I would not require her to see me before. She has met me before too (at [facility] actually some years ago).  Thanks, pad ----- Message ----- From: Pate Burnard VEAR, MD Sent: 09/12/2024  12:03 PM EST To: Moccasin Cleatus, MD; Kieth JAYSON Carolin, MD  Hi, This patient did not tolerate the endometrial biopsy attempt--I got to the endocervical / uterine junction and she jumped.  The US  shows possible poylp.  After looking at my note will you let me know if you would like a preop visit vs booking straight to OR.  She is morbidly obese and I was able to get to cervix easily.    Thanks!!  [facility]

## 2024-09-16 ENCOUNTER — Encounter (INDEPENDENT_AMBULATORY_CARE_PROVIDER_SITE_OTHER): Payer: Self-pay | Admitting: Family Medicine

## 2024-09-16 ENCOUNTER — Ambulatory Visit (INDEPENDENT_AMBULATORY_CARE_PROVIDER_SITE_OTHER): Payer: Self-pay | Admitting: Family Medicine

## 2024-09-16 VITALS — BP 132/77 | HR 81 | Temp 97.4°F | Ht 66.0 in | Wt 280.0 lb

## 2024-09-16 DIAGNOSIS — I10 Essential (primary) hypertension: Secondary | ICD-10-CM | POA: Diagnosis not present

## 2024-09-16 DIAGNOSIS — R61 Generalized hyperhidrosis: Secondary | ICD-10-CM

## 2024-09-16 DIAGNOSIS — R531 Weakness: Secondary | ICD-10-CM

## 2024-09-16 DIAGNOSIS — E559 Vitamin D deficiency, unspecified: Secondary | ICD-10-CM

## 2024-09-16 DIAGNOSIS — R7303 Prediabetes: Secondary | ICD-10-CM

## 2024-09-16 DIAGNOSIS — G4733 Obstructive sleep apnea (adult) (pediatric): Secondary | ICD-10-CM

## 2024-09-16 DIAGNOSIS — K5900 Constipation, unspecified: Secondary | ICD-10-CM

## 2024-09-16 DIAGNOSIS — Z6841 Body Mass Index (BMI) 40.0 and over, adult: Secondary | ICD-10-CM

## 2024-09-16 DIAGNOSIS — K5901 Slow transit constipation: Secondary | ICD-10-CM

## 2024-09-16 DIAGNOSIS — K529 Noninfective gastroenteritis and colitis, unspecified: Secondary | ICD-10-CM

## 2024-09-16 DIAGNOSIS — E669 Obesity, unspecified: Secondary | ICD-10-CM

## 2024-09-16 MED ORDER — PHENTERMINE HCL 37.5 MG PO CAPS: 37.5000 mg | ORAL_CAPSULE | ORAL | 0 refills | Status: DC

## 2024-09-16 MED ORDER — METFORMIN HCL 500 MG PO TABS: ORAL_TABLET | ORAL | 0 refills | Status: DC

## 2024-09-16 MED ORDER — AMLODIPINE BESYLATE 5 MG PO TABS: 5.0000 mg | ORAL_TABLET | Freq: Every day | ORAL | 0 refills | Status: DC

## 2024-09-16 NOTE — Progress Notes (Signed)
 Office: [phone]  /  Fax: [phone]  WEIGHT SUMMARY AND BIOMETRICS  Anthropometric Measurements Height: 5' 6 (1.676 m) Weight: 280 lb (127 kg) BMI (Calculated): 45.21 Weight at Last Visit: 284 lb Weight Lost Since Last Visit: 4 lb Weight Gained Since Last Visit: 0 Starting Weight: 378 lb Total Weight Loss (lbs): 98 lb (44.5 kg) Peak Weight: 407 lb   Body Composition  Body Fat %: 52.9 % Fat Mass (lbs): 148.4 lbs Muscle Mass (lbs): 125.6 lbs Total Body Water  (lbs): 97.8 lbs Visceral Fat Rating : 19   Other Clinical Data Fasting: no Labs: no Today's Visit #: 57 Starting Date: 12/27/20    Chief Complaint: OBESITY    History of Present Illness Laura Knox is a 58 year old female who presents for obesity treatment plan assessment and progress evaluation.  She has been adhering to a pescetarian diet, aiming for 1200-1400 calories and over 100 grams of protein daily, with about 75% adherence. She exercises at the gym for 60 minutes several times a week and has lost 4 pounds in the last month.  She is currently taking phentermine  37.5 mg for obesity. She requests a refill of this medication.  In addition to obesity, she is being treated for hypertension with amlodipine  5 mg and losartan  100 mg. She requests a refill of amlodipine .  She is also on metformin  500 mg twice daily for prediabetes and is working on diet, exercise, and weight loss. She requests a refill of metformin .  She experienced a recent episode of severe gastrointestinal distress while traveling, characterized by cramping, sweating, weakness, and a significant bowel movement that provided relief. She also experienced vomiting and was taken to the hospital, where her lipase was elevated at over 300. She did not receive treatment at the hospital and left after feeling better.  She has a history of sleep apnea, previously diagnosed as moderate and now mild, with no significant findings from a  recent sleep study. She has addressed this with weight loss and lifestyle changes.  She reports a recent positive test for chlamydia and subsequent antibiotic treatment, which led to prolonged bleeding. An ultrasound revealed a small ovarian polyp, and she plans to follow up with her gynecologist in December.  She is actively participating in [employer] exercise classes three times a week and is increasing her physical activity, including walking and hiking. She reports improved muscle mass and balance, with occasional knee issues that are not debilitating.      PHYSICAL EXAM:  Blood pressure 132/77, pulse 81, temperature (!) 97.4 F (36.3 C), height 5' 6 (1.676 m), weight 280 lb (127 kg), SpO2 98%. Body mass index is 45.19 kg/m.  DIAGNOSTIC DATA REVIEWED:  BMET    Component Value Date/Time   NA 140 02/02/2024 0917   K 4.2 02/02/2024 0917   CL 105 02/02/2024 0917   CO2 22 02/02/2024 0917   GLUCOSE 99 02/02/2024 0917   GLUCOSE 98 07/10/2023 1524   BUN 18 02/02/2024 0917   CREATININE 0.72 02/02/2024 0917   CALCIUM 8.7 02/02/2024 0917   GFRNONAA >60 07/10/2023 1524   GFRAA 128 02/06/2021 0000   Lab Results  Component Value Date   HGBA1C 5.7 (H) 02/02/2024   HGBA1C 6.0 (H) 12/27/2020   Lab Results  Component Value Date   INSULIN  11.6 02/02/2024   INSULIN  20.4 12/27/2020   Lab Results  Component Value Date   TSH 1.060 02/02/2024   CBC    Component Value Date/Time  WBC 8.4 09/30/2023 0915   RBC 4.75 09/30/2023 0915   HGB 13.9 09/30/2023 0915   HGB 13.7 12/27/2020 0848   HCT 42.4 09/30/2023 0915   HCT 41.2 12/27/2020 0848   PLT 360.0 09/30/2023 0915   PLT 305 12/27/2020 0848   MCV 89.2 09/30/2023 0915   MCV 87 12/27/2020 0848   MCH 28.1 07/10/2023 1524   MCHC 32.8 09/30/2023 0915   RDW 13.5 09/30/2023 0915   RDW 13.2 12/27/2020 0848   Iron Studies    Component Value Date/Time   FERRITIN 88 02/06/2021 0000   Lipid Panel     Component Value  Date/Time   CHOL 139 02/02/2024 0917   TRIG 59 02/02/2024 0917   HDL 52 02/02/2024 0917   CHOLHDL 3 02/04/2023 0715   VLDL 10.6 02/04/2023 0715   LDLCALC 75 02/02/2024 0917   Hepatic Function Panel     Component Value Date/Time   PROT 6.6 02/02/2024 0917   ALBUMIN 4.0 02/02/2024 0917   AST 10 02/02/2024 0917   ALT 9 02/02/2024 0917   ALKPHOS 95 02/02/2024 0917   BILITOT 0.5 02/02/2024 0917      Component Value Date/Time   TSH 1.060 02/02/2024 0917   Nutritional Lab Results  Component Value Date   VD25OH 36.6 02/02/2024   VD25OH 30.4 10/13/2023   VD25OH 41.3 08/12/2022     Assessment and Plan Assessment & Plan Obesity with ongoing lifestyle and pharmacologic management Managed with a pescetarian diet, journaling, and exercise. She adheres to the plan 75% of the time, exercises 60 minutes per week, and has lost 4 pounds in the last month. Phentermine  is used without affecting blood pressure. - Continue pescetarian diet and exercise regimen - Refilled phentermine  37.5 mg  Essential hypertension, controlled on amlodipine  and losartan  Hypertension is well-controlled with amlodipine  5 mg and losartan  100 mg. Blood pressure is 132/77 mmHg. - Refilled amlodipine  5 mg - Continue diet, exercise and weight loss as discussed today as an important part of the treatment plan   Prediabetes, managed with metformin  and lifestyle modification Prediabetes managed with metformin  500 mg twice daily and lifestyle modifications including diet and exercise. - Refilled metformin  500 mg twice daily - Continue diet, exercise and weight loss as discussed today as an important part of the treatment plan -Checl HgA1c   Constipation with recent severe episode, ongoing bowel regimen Recent severe episode of constipation with large bowel movement and bloody stool, likely due to discontinuation of MiraLAX . Symptoms improved with resumption of MiraLAX . 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 Elevated lipase noted during the episode. Differential includes food poisoning, especially after seafood consumption. - Ordered lipase and amylase levels - Ordered kidney function tests  Vit D deficiency Due for labs -Check labs and follow  Mild obstructive sleep apnea, stable post weight loss Mild obstructive sleep apnea, previously moderate, now improved post weight loss. Recent sleep study showed mild severity with no significant oxygen desaturation. No current CPAP use due to improved symptoms and weight loss. - Continue current management without CPAP      Patients who are on anti-obesity medications are counseled on the importance of maintaining healthy lifestyle habits, including balanced nutrition, regular physical activity, and behavioral modifications,  Medication is an adjunct to, not a replacement for, lifestyle changes  and that the long-term success and weight maintenance depend on continued adherence to these strategies.   Romey was informed of the importance of frequent follow up visits to maximize her success with intensive lifestyle modifications for her obesity and obesity related health conditions as recommended by USPSTF and CMS guidelines  I personally spent a total of 56 minutes in the care of the patient today including preparing to see the patient, reviewing separately obtained history, performing a medically appropriate evaluation of current problems, placing orders in the EMR, documenting clinical information in the EMR, customized nutritional counseling for their specific health and social needs, and discussing strategies to help avoid social eating challenges.  Louann Penton, MD

## 2024-09-17 ENCOUNTER — Other Ambulatory Visit (INDEPENDENT_AMBULATORY_CARE_PROVIDER_SITE_OTHER): Payer: Self-pay | Admitting: Family Medicine

## 2024-09-17 DIAGNOSIS — I10 Essential (primary) hypertension: Secondary | ICD-10-CM

## 2024-09-17 LAB — HEMOGLOBIN A1C
Est. average glucose Bld gHb Est-mCnc: 117 mg/dL
Hgb A1c MFr Bld: 5.7 % — ABNORMAL HIGH (ref 4.8–5.6)

## 2024-09-17 LAB — CMP14+EGFR
ALT: 12 IU/L (ref 0–32)
AST: 15 IU/L (ref 0–40)
Albumin: 4.1 g/dL (ref 3.8–4.9)
Alkaline Phosphatase: 94 IU/L (ref 49–135)
BUN/Creatinine Ratio: 25 — ABNORMAL HIGH (ref 9–23)
BUN: 18 mg/dL (ref 6–24)
Bilirubin Total: 0.5 mg/dL (ref 0.0–1.2)
CO2: 25 mmol/L (ref 20–29)
Calcium: 9.3 mg/dL (ref 8.7–10.2)
Chloride: 101 mmol/L (ref 96–106)
Creatinine, Ser: 0.71 mg/dL (ref 0.57–1.00)
Globulin, Total: 2.7 g/dL (ref 1.5–4.5)
Glucose: 83 mg/dL (ref 70–99)
Potassium: 4.1 mmol/L (ref 3.5–5.2)
Sodium: 138 mmol/L (ref 134–144)
Total Protein: 6.8 g/dL (ref 6.0–8.5)
eGFR: 98 mL/min/1.73 (ref >59)

## 2024-09-17 LAB — AMYLASE: Amylase: 45 U/L (ref 31–110)

## 2024-09-17 LAB — VITAMIN D 25 HYDROXY (VIT D DEFICIENCY, FRACTURES): Vit D, 25-Hydroxy: 35.2 ng/mL (ref 30.0–100.0)

## 2024-09-17 LAB — LIPASE: Lipase: 89 U/L — ABNORMAL HIGH (ref 14–72)

## 2024-09-23 ENCOUNTER — Encounter (INDEPENDENT_AMBULATORY_CARE_PROVIDER_SITE_OTHER): Payer: Self-pay | Admitting: Family Medicine

## 2024-10-07 ENCOUNTER — Encounter (INDEPENDENT_AMBULATORY_CARE_PROVIDER_SITE_OTHER): Payer: Self-pay | Admitting: Family Medicine

## 2024-10-13 ENCOUNTER — Encounter: Payer: Self-pay | Admitting: Obstetrics and Gynecology

## 2024-10-13 ENCOUNTER — Ambulatory Visit: Payer: Self-pay | Admitting: Obstetrics and Gynecology

## 2024-10-13 VITALS — BP 131/85 | HR 80 | Ht 66.0 in | Wt 283.0 lb

## 2024-10-13 DIAGNOSIS — N840 Polyp of corpus uteri: Secondary | ICD-10-CM

## 2024-10-13 DIAGNOSIS — N950 Postmenopausal bleeding: Secondary | ICD-10-CM | POA: Diagnosis not present

## 2024-10-13 NOTE — Progress Notes (Signed)
 PRE OPERATIVE GYNECOLOGY VISIT  Subjective:  Laura Knox is a 58 y.o. menopausal G0P0000 presenting for PMB follow up and pre op discussion  On HT with estradiol  patch & prometrium .  Has had a couple of episodes of PMB that were initially attributed to post coital bleeding and chlamydia. An EMB was attempted in the office on 11/10, but could not be completed due to pain. Pelvic US  on 09/07/24 revealed 8cm uterus with EL 5mm as possible feeder vessel c/f endometrial polyp.  Past Medical History:  Diagnosis Date   B12 deficiency    Bilateral swelling of feet and ankles    Complication of anesthesia    woke up feeling sore head to toe.  Did not have any issuses with anesthesia in January 2024.   Family history of adverse reaction to anesthesia    mother - nausea   History of kidney stones    Hypertension    Hypothyroidism    Joint pain    Knee pain    Osteoarthritis    Other fatigue    Pre-diabetes    Prediabetes    Sleep apnea    does not use CPAP   Thyroid  disease    Vitamin D  deficiency    Past Surgical History:  Procedure Laterality Date   COLONOSCOPY WITH PROPOFOL  N/A 04/10/2021   Procedure: COLONOSCOPY WITH PROPOFOL ;  Surgeon: Charlanne Groom, MD;  Location: [facility];  Service: Endoscopy;  Laterality: N/A;   JOINT REPLACEMENT     KIDNEY STONE SURGERY     miniscus repair Left    POLYPECTOMY  04/10/2021   Procedure: POLYPECTOMY;  Surgeon: Charlanne Groom, MD;  Location: [facility];  Service: Endoscopy;;   SEPTOPLASTY     TOTAL KNEE ARTHROPLASTY Left 11/22/2022   Procedure: LEFT TOTAL KNEE REPLACEMENT;  Surgeon: Jerri Kay HERO, MD;  Location: [facility];  Service: Orthopedics;  Laterality: Left;   TOTAL KNEE ARTHROPLASTY Right 07/21/2023   Procedure: RIGHT TOTAL KNEE REPLACEMENT;  Surgeon: Jerri Kay HERO, MD;  Location: [facility];  Service: Orthopedics;  Laterality: Right;   Medications Ordered Prior to Encounter[1] Allergies[2] OB History     Gravida  0   Para  0    Term  0   Preterm  0   AB  0   Living  0      SAB  0   IAB  0   Ectopic  0   Multiple  0   Live Births  0          Social History   Socioeconomic History   Marital status: Legally Separated    Spouse name: Prentice   Number of children: 0   Years of education: Not on file   Highest education level: Not on file  Occupational History   Occupation: Retired  Tobacco Use   Smoking status: Never   Smokeless tobacco: Never  Vaping Use   Vaping status: Never Used  Substance and Sexual Activity   Alcohol use: Not Currently    Comment: Socially   Drug use: Never   Sexual activity: Yes    Partners: Male  Other Topics Concern   Not on file  Social History Narrative   Not on file   Social Drivers of Health   Tobacco Use: Low Risk (10/13/2024)   Patient History    Smoking Tobacco Use: Never    Smokeless Tobacco Use: Never    Passive Exposure: Not on file  Financial Resource Strain: Low Risk (03/17/2022)  Received from [employer]   Overall Financial Resource Strain (CARDIA)    Difficulty of Paying Living Expenses: Not hard at all  Food Insecurity: No Food Insecurity (03/17/2022)   Received from [facility]   Epic    Within the past 12 months, you worried that your food would run out before you got the money to buy more.: Never true    Within the past 12 months, the food you bought just didn't last and you didn't have money to get more.: Never true  Transportation Needs: Not on file  Physical Activity: Sufficiently Active (03/17/2022)   Received from [facility]   Exercise Vital Sign    On average, how many days per week do you engage in moderate to strenuous exercise (like a brisk walk)?: 5 days    On average, how many minutes do you engage in exercise at this level?: 60 min  Stress: No Stress Concern Present (03/17/2022)   Received from [facility] - Occupational Stress Questionnaire    Feeling of Stress :  Not at all  Social Connections: Socially Integrated (03/17/2022)   Received from [facility]   Social Network    How would you rate your social network (family, work, friends)?: Good participation with social networks  Intimate Partner Violence: Not At Risk (03/17/2022)   Received from [facility]   HITS    Over the last 12 months how often did your partner physically hurt you?: Never    Over the last 12 months how often did your partner insult you or talk down to you?: Never    Over the last 12 months how often did your partner threaten you with physical harm?: Never    Over the last 12 months how often did your partner scream or curse at you?: Never  Depression (PHQ2-9): Low Risk (01/31/2023)   Depression (PHQ2-9)    PHQ-2 Score: 0  Alcohol Screen: Not on file  Housing: Not on file  Utilities: Not on file  Health Literacy: Not on file    Objective:   Vitals:   10/13/24 0848  BP: 131/85  Pulse: 80  Weight: 283 lb (128.4 kg)  Height: 5' 6 (1.676 m)   General:  Alert, oriented and cooperative. Patient is in no acute distress.  Skin: Skin is warm and dry. No rash noted.   Cardiovascular: Normal heart rate noted  Respiratory: Normal respiratory effort, no problems with respiration noted   Assessment and Plan:  Laura Knox is a 58 y.o. with PMB and suspected endometrial polyp  PMB, endometrial polyp - Recommended removal with hysteroscopy & polypectomy. Reviewed low risk of malignancy within the polyp and that removal will allow for complete pathologic evaluation. Polypectomy will also stop her bleeding.  - We also discussed option for IUD to continue to provide endometrial protection x 5 years and allow her to stop prometrium . She is considering this option - Risks of surgery include but are not limited to: bleeding, infection, uterine perforation with injury to bowel/bladder/blood vessels, fluid overload, electrolyte abnormalities, need for additional procedures, VTE,  anesthesia reaction or medical complications like MI/CVA/death - We discussed postop restrictions, precautions and expectations - Preop testing per anesthesia - All questions answered  -     Ambulatory Referral For Surgery Scheduling  Future Appointments  Date Time Provider Department Center  10/25/2024 12:20 PM Verdon Louann BIRCH, MD [facility] None  11/22/2024 12:40 PM Verdon Louann BIRCH, MD [facility] None  01/04/2025  3:30 PM Neda Jennet LABOR, MD [facility] [address]  07/12/2025  1:00 PM Jerri Kay HERO, MD [facility] None   Kieth JAYSON Carolin, MD     [1]  Current Outpatient Medications on File Prior to Visit  Medication Sig Dispense Refill   amLODipine  (NORVASC ) 5 MG tablet Take 1 tablet (5 mg total) by mouth daily. 30 tablet 0   Ascorbic Acid (VITAMIN C) 1000 MG tablet Take 1,000 mg by mouth daily.     estradiol  (VIVELLE -DOT) 0.1 MG/24HR patch PLACE 1 PATCH (0.1 MG TOTAL) ONTO THE SKIN 2 (TWO) TIMES A WEEK. 8 patch 1   levothyroxine  (SYNTHROID ) 100 MCG tablet TAKE 1 TABLET BY MOUTH EVERY DAY 30 tablet 5   losartan  (COZAAR ) 100 MG tablet TAKE 1 TABLET BY MOUTH EVERYDAY AT BEDTIME 30 tablet 5   metFORMIN  (GLUCOPHAGE ) 500 MG tablet 2 po with lunch qd 60 tablet 0   methocarbamol  (ROBAXIN ) 500 MG tablet Take 1 tablet (500 mg total) by mouth daily as needed. 10 tablet 0   Multiple Vitamins-Minerals (HAIR/SKIN/NAILS/BIOTIN PO) Take 2 tablets by mouth daily.     phentermine  37.5 MG capsule Take 1 capsule (37.5 mg total) by mouth every morning. 30 capsule 0   progesterone  (PROMETRIUM ) 100 MG capsule Take 1 capsule (100 mg total) by mouth at bedtime. 90 capsule 3   cetirizine (ZYRTEC) 10 MG tablet Take 10 mg by mouth daily. (Patient not taking: Reported on 10/13/2024)     No current facility-administered medications on file prior to visit.  [2]  Allergies Allergen Reactions   Chlorthalidone  Other (See Comments)    Bottomed out potassium

## 2024-10-19 ENCOUNTER — Other Ambulatory Visit (INDEPENDENT_AMBULATORY_CARE_PROVIDER_SITE_OTHER): Payer: Self-pay | Admitting: Family Medicine

## 2024-10-19 DIAGNOSIS — I10 Essential (primary) hypertension: Secondary | ICD-10-CM

## 2024-10-25 ENCOUNTER — Ambulatory Visit (INDEPENDENT_AMBULATORY_CARE_PROVIDER_SITE_OTHER): Payer: Self-pay | Admitting: Family Medicine

## 2024-10-25 ENCOUNTER — Encounter: Payer: Self-pay | Admitting: Obstetrics and Gynecology

## 2024-10-25 ENCOUNTER — Encounter (INDEPENDENT_AMBULATORY_CARE_PROVIDER_SITE_OTHER): Payer: Self-pay | Admitting: Family Medicine

## 2024-10-25 DIAGNOSIS — Z6841 Body Mass Index (BMI) 40.0 and over, adult: Secondary | ICD-10-CM

## 2024-10-25 DIAGNOSIS — E669 Obesity, unspecified: Secondary | ICD-10-CM | POA: Diagnosis not present

## 2024-10-25 DIAGNOSIS — I10 Essential (primary) hypertension: Secondary | ICD-10-CM | POA: Diagnosis not present

## 2024-10-25 MED ORDER — AMLODIPINE BESYLATE 5 MG PO TABS: 5.0000 mg | ORAL_TABLET | Freq: Every day | ORAL | 0 refills | Status: DC

## 2024-10-25 MED ORDER — PHENTERMINE HCL 37.5 MG PO CAPS: 37.5000 mg | ORAL_CAPSULE | ORAL | 0 refills | Status: AC

## 2024-10-25 NOTE — Progress Notes (Signed)
 "  Office: [phone]  /  Fax: [phone]  WEIGHT SUMMARY AND BIOMETRICS  Anthropometric Measurements Height: 5' 6 (1.676 m) Weight: 274 lb (124.3 kg) BMI (Calculated): 44.25 Weight at Last Visit: 280lb Weight Lost Since Last Visit: 6lb Weight Gained Since Last Visit: 0lb Starting Weight: 378lb Total Weight Loss (lbs): 104 lb (47.2 kg) Peak Weight: 407lb   Body Composition  Body Fat %: 51.6 % Fat Mass (lbs): 141.6 lbs Muscle Mass (lbs): 126.2 lbs Total Body Water  (lbs): 96 lbs Visceral Fat Rating : 18   Other Clinical Data Fasting: yes Labs: no Today's Visit #: 58 Starting Date: 12/27/20    Chief Complaint: OBESITY    History of Present Illness Laura Knox is a 58 year old female with obesity and hypertension who presents for obesity treatment and progress assessment.  She follows a pescetarian eating plan approximately 75% of the time, focusing on increasing her intake of fruits, vegetables, and protein. She is adequately hydrating and not skipping meals. She has lost six pounds in the last five weeks and over 100 pounds since starting her weight loss journey.  Her exercise routine includes an hour of activity seven days a week, attending workout classes at the gym. She participates in [employer] classes on Monday, Wednesday, and Friday, pool classes on Tuesday, Thursday, and sometimes Saturday, and machine workouts once a week. She has increased her biking to 20 minutes at a resistance level of seven.  She is currently taking phentermine  37.5 mg daily for obesity, which has not affected her hypertension. No palpitations, insomnia, or tremors. She has adjusted the timing of her phentermine  intake to noon to manage evening hunger without affecting her sleep.  She is also being treated for hypertension with Norvasc  5 mg daily, and her blood pressure today was 116/82. She requests a refill of her medication.  She reports having a cold for the past eight  days, which is now resolving. She experienced sleep disturbances for three nights but has since improved.  She has started using [employer] meal services, which she finds convenient and cost-effective, providing high-quality, healthy meals that support her dietary goals.  She hosted her parents for Christmas and attended social gatherings, managing to maintain her dietary goals during the holidays. She lives alone, which she finds beneficial for managing her diet and expenses.      PHYSICAL EXAM:  Blood pressure 116/82, pulse (!) 106, temperature 97.9 F (36.6 C), height 5' 6 (1.676 m), weight 274 lb (124.3 kg), SpO2 97%. Body mass index is 44.22 kg/m.  DIAGNOSTIC DATA REVIEWED:  BMET    Component Value Date/Time   NA 138 09/16/2024 1306   K 4.1 09/16/2024 1306   CL 101 09/16/2024 1306   CO2 25 09/16/2024 1306   GLUCOSE 83 09/16/2024 1306   GLUCOSE 98 07/10/2023 1524   BUN 18 09/16/2024 1306   CREATININE 0.71 09/16/2024 1306   CALCIUM 9.3 09/16/2024 1306   GFRNONAA >60 07/10/2023 1524   GFRAA 128 02/06/2021 0000   Lab Results  Component Value Date   HGBA1C 5.7 (H) 09/16/2024   HGBA1C 6.0 (H) 12/27/2020   Lab Results  Component Value Date   INSULIN  11.6 02/02/2024   INSULIN  20.4 12/27/2020   Lab Results  Component Value Date   TSH 1.060 02/02/2024   CBC    Component Value Date/Time   WBC 8.4 09/30/2023 0915   RBC 4.75 09/30/2023 0915   HGB 13.9 09/30/2023 0915   HGB 13.7 12/27/2020  0848   HCT 42.4 09/30/2023 0915   HCT 41.2 12/27/2020 0848   PLT 360.0 09/30/2023 0915   PLT 305 12/27/2020 0848   MCV 89.2 09/30/2023 0915   MCV 87 12/27/2020 0848   MCH 28.1 07/10/2023 1524   MCHC 32.8 09/30/2023 0915   RDW 13.5 09/30/2023 0915   RDW 13.2 12/27/2020 0848   Iron Studies    Component Value Date/Time   FERRITIN 88 02/06/2021 0000   Lipid Panel     Component Value Date/Time   CHOL 139 02/02/2024 0917   TRIG 59 02/02/2024 0917   HDL 52 02/02/2024 0917    CHOLHDL 3 02/04/2023 0715   VLDL 10.6 02/04/2023 0715   LDLCALC 75 02/02/2024 0917   Hepatic Function Panel     Component Value Date/Time   PROT 6.8 09/16/2024 1306   ALBUMIN 4.1 09/16/2024 1306   AST 15 09/16/2024 1306   ALT 12 09/16/2024 1306   ALKPHOS 94 09/16/2024 1306   BILITOT 0.5 09/16/2024 1306      Component Value Date/Time   TSH 1.060 02/02/2024 0917   Nutritional Lab Results  Component Value Date   VD25OH 35.2 09/16/2024   VD25OH 36.6 02/02/2024   VD25OH 30.4 10/13/2023     Assessment and Plan Assessment & Plan Obesity Significant weight loss of over 100 pounds since starting weight loss journey. Currently following a pescetarian eating plan 75% of the time, increasing fruits and vegetables, and meeting protein recommendations. Hydrating adequately and not skipping meals. Engaging in regular exercise, including [employer] classes, pool classes, and machine workouts. Recently started using [employer] meal service, which has been beneficial for maintaining a healthy diet. No adverse effects from phentermine , and no issues with sleep or exercise routine. - Continue pescetarian eating plan and regular exercise regimen. - Continue phentermine  37.5 mg daily. - Continue using [employer] meal service.  Essential hypertension Well controlled with current medication regimen. Blood pressure today is 116/82 mmHg. No symptoms of lightheadedness, dizziness, palpitations, insomnia, or tremors. Phentermine  not currently affecting her blood pressure. - Continue Norvasc  (amlodipine ) 5 mg daily. - Refilled Norvasc  prescription.      Patients who are on anti-obesity medications are counseled on the importance of maintaining healthy lifestyle habits, including balanced nutrition, regular physical activity, and behavioral modifications,  Medication is an adjunct to, not a replacement for, lifestyle changes and that the long-term success and weight maintenance depend on  continued adherence to these strategies.   Malasha was informed of the importance of frequent follow up visits to maximize her success with intensive lifestyle modifications for her obesity and obesity related health conditions as recommended by USPSTF and CMS guidelines  Louann Penton, MD   "

## 2024-10-26 ENCOUNTER — Other Ambulatory Visit: Payer: Self-pay

## 2024-10-26 MED ORDER — ESTRADIOL 0.1 MG/24HR TD PTTW: 1.0000 | MEDICATED_PATCH | TRANSDERMAL | 0 refills | Status: AC

## 2024-11-01 ENCOUNTER — Encounter: Payer: Self-pay | Admitting: Obstetrics and Gynecology

## 2024-11-02 ENCOUNTER — Telehealth: Payer: Self-pay

## 2024-11-02 NOTE — Telephone Encounter (Signed)
 I called patient to see if she's available for surgery with Dr. Erik on 11/29/24 at [facility] at 12 pm. I left a detailed voicemail and requested a call back.

## 2024-11-02 NOTE — Telephone Encounter (Signed)
 Patient returned my call and confirmed surgery details for 11/29/24 w/ Dr. Erik. Pre-Op instructions were provided by phone.

## 2024-11-08 ENCOUNTER — Encounter (INDEPENDENT_AMBULATORY_CARE_PROVIDER_SITE_OTHER): Payer: Self-pay | Admitting: Family Medicine

## 2024-11-08 DIAGNOSIS — R7303 Prediabetes: Secondary | ICD-10-CM

## 2024-11-08 MED ORDER — METFORMIN HCL 500 MG PO TABS: ORAL_TABLET | ORAL | 0 refills | Status: AC

## 2024-11-08 NOTE — Telephone Encounter (Signed)
 LAST APPOINTMENT DATE: 09/16/2024 NEXT APPOINTMENT DATE: 11/22/2024   [id number] - [place], [place] - [address] [place] [address] Phone: [phone] Fax: [phone]  [facility] - [place], [place] - [address] [place] [address] Phone: [phone] Fax: [phone]  Patient is requesting a refill of the following medications: Requested Prescriptions   Pending Prescriptions Disp Refills   metFORMIN  (GLUCOPHAGE ) 500 MG tablet 60 tablet 0    Sig: 2 po with lunch qd    Date last filled: 09/16/2024 Previously prescribed by Dr Verdon  Lab Results  Component Value Date   HGBA1C 5.7 (H) 09/16/2024   HGBA1C 5.7 (H) 02/02/2024   HGBA1C 5.6 10/13/2023   Lab Results  Component Value Date   LDLCALC 75 02/02/2024   CREATININE 0.71 09/16/2024   Lab Results  Component Value Date   VD25OH 35.2 09/16/2024   VD25OH 36.6 02/02/2024   VD25OH 30.4 10/13/2023    BP Readings from Last 3 Encounters:  10/25/24 116/82  10/13/24 131/85  09/16/24 132/77

## 2024-11-12 ENCOUNTER — Other Ambulatory Visit: Payer: Self-pay | Admitting: Medical

## 2024-11-12 DIAGNOSIS — I10 Essential (primary) hypertension: Secondary | ICD-10-CM

## 2024-11-12 NOTE — Telephone Encounter (Signed)
 Refiled losartan . Pt is seeing weight loss MD office  thru [facility] and looks like other pcp office but not the case. But pt needs to follow up with me at least once a year. If she does not can't continue to refill med. So get her scheduled to see by end of February.

## 2024-11-15 ENCOUNTER — Other Ambulatory Visit: Payer: Self-pay | Admitting: Obstetrics and Gynecology

## 2024-11-15 NOTE — Telephone Encounter (Signed)
 Called pt but no answer vm left for pt to call back

## 2024-11-15 NOTE — Telephone Encounter (Signed)
 Copied from CRM [id number]. Topic: General - Other >> Nov 15, 2024 11:26 AM Alfonso HERO wrote: Reason for CRM: patient returning your call about her Losartan . She also stated she picked an Rx for it this morning from the pharmacy.

## 2024-11-15 NOTE — Telephone Encounter (Signed)
 Called pt and notified her that she needs to be seen soon to continue her medications. We were able to schedule an apt for 11/24/2024

## 2024-11-22 ENCOUNTER — Ambulatory Visit (INDEPENDENT_AMBULATORY_CARE_PROVIDER_SITE_OTHER): Admitting: Family Medicine

## 2024-11-24 ENCOUNTER — Ambulatory Visit: Admitting: Medical

## 2024-11-24 ENCOUNTER — Encounter: Payer: Self-pay | Admitting: Obstetrics and Gynecology

## 2024-11-24 VITALS — BP 122/84 | HR 83 | Temp 98.0°F | Resp 14 | Ht 66.0 in | Wt 285.0 lb

## 2024-11-24 DIAGNOSIS — E559 Vitamin D deficiency, unspecified: Secondary | ICD-10-CM | POA: Diagnosis not present

## 2024-11-24 DIAGNOSIS — Z6841 Body Mass Index (BMI) 40.0 and over, adult: Secondary | ICD-10-CM | POA: Diagnosis not present

## 2024-11-24 DIAGNOSIS — E039 Hypothyroidism, unspecified: Secondary | ICD-10-CM | POA: Diagnosis not present

## 2024-11-24 DIAGNOSIS — I10 Essential (primary) hypertension: Secondary | ICD-10-CM | POA: Diagnosis not present

## 2024-11-24 DIAGNOSIS — Z23 Encounter for immunization: Secondary | ICD-10-CM

## 2024-11-24 DIAGNOSIS — R7303 Prediabetes: Secondary | ICD-10-CM | POA: Diagnosis not present

## 2024-11-24 MED ORDER — AMLODIPINE BESYLATE 5 MG PO TABS: 5.0000 mg | ORAL_TABLET | Freq: Every day | ORAL | 3 refills | Status: AC

## 2024-11-24 MED ORDER — LOSARTAN POTASSIUM 100 MG PO TABS: ORAL_TABLET | ORAL | 3 refills | Status: AC

## 2024-11-24 NOTE — Addendum Note (Signed)
 Addended by: GERARD CHUCKIE SAILOR on: 11/24/2024 02:04 PM   Modules accepted: Orders

## 2024-11-24 NOTE — Patient Instructions (Addendum)
 Morbid obesity Significant weight loss achieved through lifestyle changes and phentermine . Current weight 285 pounds with fluctuations. Engages in regular physical activity. - Continue weight loss program and physical activity. - Refill phentermine  prescription.  Prediabetes A1c well-managed at 5.7% with metformin . - Continue metformin . - Monitor A1c annually unless indicated.  Essential hypertension Blood pressure controlled at 122/84 mmHg on losartan  and amlodipine . - Continue losartan  and amlodipine .  Hypothyroidism Managed with Synthroid . TSH levels normal. Reports low metabolism. - Continue Synthroid . - Monitor thyroid  function as needed.  Vitamin D  deficiency Vitamin D  level at lower end of normal. Discussed supplementation benefits. - Recommend vitamin D  4000 IU daily.   Follow up one year or sooner if needed

## 2024-11-24 NOTE — Progress Notes (Signed)
 "  Subjective:    Patient ID: Laura Knox, female    DOB: 10-Jul-1966, 59 y.o.   MRN: [id number]  HPI    Pt last A1c with myself below in   Last AVS one year ago.  Hypertension Well controlled on Losartan  100mg  daily and Amlodipine  5mg  BID. -Continue current regimen.   Prediabetes A1c 5.6 in September, patient on Metformin  500mg  BID. -Continue Metformin . -Check recent A1c and glucose levels.   Hypothyroidism On Armour Thyroid  120mg  daily. TSH slightly low in April, T4 normal. -Order TSH and T4 today. -Consider switching to Levothyroxine  for cost-effectiveness, pending discussion with pharmacist regarding dose equivalency.   Pharyngitis with cervical lymphadenopathy Resolved with Augmentin . -No further action required.   Eustachian tube dysfunction Patient reports intermittent pressure sensation and sloshy feeling in ear. Exam normal.  -Continue Flonase  two sprays each nostril daily for at least two weeks. -let me know if still having symptoms in 2 weeks.   Declnes hep b testing for immunity. Will get pcv 20 vaccine today.    Discussed the use of AI scribe software for clinical note transcription with the patient, who gave verbal consent to proceed.  History of Present Illness   Laura Knox is a 59 year old female with prediabetes and hypertension who presents for a medication refill and weight management.  She is seeking a phentermine  refill as part of her weight management program. She missed her recent weight management appointment required for refills and is scheduled to follow up in February. She has lost over 100 pounds without Ozempic  or surgery and notes a recent small weight increase, though she lost six pounds over the holidays.  She has prediabetes and takes metformin . Her last A1c in November was 5.7% and she monitors her weight regularly.  She has hypertension treated with losartan  and amlodipine . She recently switched from Armour Thyroid  to  Synthroid  due to cost. Her TSH was normal in May.  She is regularly physically active, including swimming, and belongs to two fitness centers, though occasional gym closures have limited some workouts.       Review of Systems  Constitutional:  Negative for chills, fatigue and fever.  HENT:  Negative for congestion and ear pain.   Respiratory:  Negative for chest tightness, shortness of breath and wheezing.   Cardiovascular:  Negative for chest pain and palpitations.  Gastrointestinal:  Negative for abdominal pain, constipation, diarrhea and vomiting.  Genitourinary:  Negative for dysuria, frequency and pelvic pain.  Musculoskeletal:  Negative for back pain and joint swelling.  Skin:  Negative for rash.  Neurological:  Negative for dizziness and headaches.  Hematological:  Negative for adenopathy.  Psychiatric/Behavioral:  Negative for behavioral problems. The patient is not nervous/anxious.      Past Medical History:  Diagnosis Date   B12 deficiency    Bilateral swelling of feet and ankles    Complication of anesthesia    woke up feeling sore head to toe.  Did not have any issuses with anesthesia in January 2024.   Family history of adverse reaction to anesthesia    mother - nausea   History of kidney stones    Hypertension    Hypothyroidism    Joint pain    Knee pain    Osteoarthritis    Other fatigue    Pre-diabetes    Prediabetes    Sleep apnea    does not use CPAP   Thyroid  disease    Vitamin D  deficiency  Social History   Socioeconomic History   Marital status: Legally Separated    Spouse name: Prentice   Number of children: 0   Years of education: Not on file   Highest education level: Not on file  Occupational History   Occupation: Retired  Tobacco Use   Smoking status: Never   Smokeless tobacco: Never  Vaping Use   Vaping status: Never Used  Substance and Sexual Activity   Alcohol use: Not Currently    Comment: Socially   Drug use: Never    Sexual activity: Yes    Partners: Male  Other Topics Concern   Not on file  Social History Narrative   Not on file   Social Drivers of Health   Tobacco Use: Low Risk (10/25/2024)   Patient History    Smoking Tobacco Use: Never    Smokeless Tobacco Use: Never    Passive Exposure: Not on file  Financial Resource Strain: Low Risk (03/17/2022)   Received from [facility]   Overall Financial Resource Strain (CARDIA)    Difficulty of Paying Living Expenses: Not hard at all  Food Insecurity: No Food Insecurity (03/17/2022)   Received [facility]   Epic    Within the past 12 months, you worried that your food would run out before you got the money to buy more.: Never true    Within the past 12 months, the food you bought just didn't last and you didn't have money to get more.: Never true  Transportation Needs: Not on file  Physical Activity: Sufficiently Active (03/17/2022)   Received from [facility]   Exercise Vital Sign    On average, how many days per week do you engage in moderate to strenuous exercise (like a brisk walk)?: 5 days    On average, how many minutes do you engage in exercise at this level?: 60 min  Stress: No Stress Concern Present (03/17/2022)   Received from [facility] - Occupational Stress Questionnaire    Feeling of Stress : Not at all  Social Connections: Socially Integrated (03/17/2022)   Received from [facility]   Social Network    How would you rate your social network (family, work, friends)?: Good participation with social networks  Intimate Partner Violence: Not At Risk (03/17/2022)   Received from [facility]   HITS    Over the last 12 months how often did your partner physically hurt you?: Never    Over the last 12 months how often did your partner insult you or talk down to you?: Never    Over the last 12 months how often did your partner threaten you with physical harm?: Never    Over the last  12 months how often did your partner scream or curse at you?: Never  Depression (PHQ2-9): Low Risk (11/24/2024)   Depression (PHQ2-9)    PHQ-2 Score: 0  Alcohol Screen: Not on file  Housing: Not on file  Utilities: Not on file  Health Literacy: Not on file    Past Surgical History:  Procedure Laterality Date   COLONOSCOPY WITH PROPOFOL  N/A 04/10/2021   Procedure: COLONOSCOPY WITH PROPOFOL ;  Surgeon: Charlanne Groom, MD;  Location: [facility];  Service: Endoscopy;  Laterality: N/A;   JOINT REPLACEMENT     KIDNEY STONE SURGERY     miniscus repair Left    POLYPECTOMY  04/10/2021   Procedure: POLYPECTOMY;  Surgeon: Charlanne Groom, MD;  Location: [facility];  Service: Endoscopy;;  SEPTOPLASTY     TOTAL KNEE ARTHROPLASTY Left 11/22/2022   Procedure: LEFT TOTAL KNEE REPLACEMENT;  Surgeon: Jerri Kay HERO, MD;  Location: [facility] OR;  Service: Orthopedics;  Laterality: Left;   TOTAL KNEE ARTHROPLASTY Right 07/21/2023   Procedure: RIGHT TOTAL KNEE REPLACEMENT;  Surgeon: Jerri Kay HERO, MD;  Location: [facility] OR;  Service: Orthopedics;  Laterality: Right;    Family History  Problem Relation Age of Onset   Healthy Mother    Obesity Mother    Cancer Father    Obesity Father    Cancer - Other Father    Stroke Maternal Grandmother    Lung cancer Maternal Grandfather     Allergies[1]  Medications Ordered Prior to Encounter[2]  BP 122/84   Pulse 83   Temp 98 F (36.7 C) (Oral)   Resp 14   Ht 5' 6 (1.676 m)   Wt 285 lb (129.3 kg)   SpO2 99%   BMI 46.00 kg/m           Objective:   Physical Exam  General Mental Status- Alert. General Appearance- Not in acute distress.   Skin General: Color- Normal Color. Moisture- Normal Moisture.  Neck No JVD.  Chest and Lung Exam Auscultation: Breath Sounds:-CTA  Cardiovascular Auscultation:Rythm- RRR Murmurs & Other Heart Sounds:Auscultation of the heart reveals- No Murmurs.  Abdomen Inspection:-Inspeection  Normal. Palpation/Percussion:Note:No mass. Palpation and Percussion of the abdomen reveal- Non Tender, Non Distended + BS, no rebound or guarding.   Neurologic Cranial Nerve exam:- CN III-XII intact(No nystagmus), symmetric smile. Strength:- 5/5 equal and symmetric strength both upper and lower extremities.       Assessment & Plan:   Morbid obesity Significant weight loss achieved through lifestyle changes and phentermine . Current weight 285 pounds with fluctuations. Engages in regular physical activity. - Continue weight loss program and physical activity. - Refill phentermine  prescription.  Prediabetes A1c well-managed at 5.7% with metformin . - Continue metformin . - Monitor A1c annually unless indicated.  Essential hypertension Blood pressure controlled at 122/84 mmHg on losartan  and amlodipine . - Continue losartan  and amlodipine .  Hypothyroidism Managed with Synthroid . TSH levels normal. Reports low metabolism. - Continue Synthroid . - Monitor thyroid  function as needed.  Vitamin D  deficiency Vitamin D  level at lower end of normal. Discussed supplementation benefits. - Recommend vitamin D  4000 IU daily.   Follow up one year or sooner if needed  [employer], PA-C     [1]  Allergies Allergen Reactions   Chlorthalidone  Other (See Comments)    Bottomed out potassium   [2]  Current Outpatient Medications on File Prior to Visit  Medication Sig Dispense Refill   amLODipine  (NORVASC ) 5 MG tablet Take 1 tablet (5 mg total) by mouth daily. 30 tablet 0   Ascorbic Acid (VITAMIN C) 1000 MG tablet Take 1,000 mg by mouth daily.     cetirizine (ZYRTEC) 10 MG tablet Take 10 mg by mouth daily.     estradiol  (VIVELLE -DOT) 0.1 MG/24HR patch Place 1 patch (0.1 mg total) onto the skin 2 (two) times a week. 8 patch 0   levothyroxine  (SYNTHROID ) 100 MCG tablet TAKE 1 TABLET BY MOUTH EVERY DAY 30 tablet 5   losartan  (COZAAR ) 100 MG tablet TAKE 1 TABLET BY MOUTH EVERYDAY AT BEDTIME  30 tablet 5   metFORMIN  (GLUCOPHAGE ) 500 MG tablet 2 po with lunch qd 60 tablet 0   methocarbamol  (ROBAXIN ) 500 MG tablet Take 1 tablet (500 mg total) by mouth daily as needed. 10 tablet 0   Multiple  Vitamins-Minerals (HAIR/SKIN/NAILS/BIOTIN PO) Take 2 tablets by mouth daily.     phentermine  37.5 MG capsule Take 1 capsule (37.5 mg total) by mouth every morning. 30 capsule 0   progesterone  (PROMETRIUM ) 100 MG capsule Take 1 capsule (100 mg total) by mouth at bedtime. 90 capsule 3   No current facility-administered medications on file prior to visit.   "

## 2024-11-25 ENCOUNTER — Encounter (HOSPITAL_COMMUNITY): Payer: Self-pay | Admitting: Obstetrics and Gynecology

## 2024-11-25 NOTE — Progress Notes (Signed)
 Spoke w/ via phone for pre-op interview--- [facility]ias Lab needs dos----   BMP and EKG per anesthesia      Lab results------ COVID test -----patient states asymptomatic no test needed Arrive at -------1230 NPO after MN NO Solid Food.  Clear liquids from MN until---1130 Pre-Surgery Ensure or G2:  Med rec completed Medications to take morning of surgery -----Norvasc  and Levothyroxine  Diabetic medication -----NONE AM of surgery.   GLP1 agonist last dose: GLP1 instructions:  Patient instructed no nail polish to be worn day of surgery Patient instructed to bring photo id and insurance card day of surgery Patient aware to have Driver (ride ) / caregiver    for 24 hours after surgery - Mother Reena Class Patient Special Instructions -----HOLD PHENTERMINE  AT LEAST 24HRS PRIOR TO SURGERY. Pre-Op special Instructions -----  Patient verbalized understanding of instructions that were given at this phone interview. Patient denies chest pain, sob, fever, cough at the interview.

## 2024-11-29 ENCOUNTER — Ambulatory Visit (HOSPITAL_COMMUNITY): Admission: RE | Admit: 2024-11-29 | Payer: Self-pay | Source: Home / Self Care | Admitting: Obstetrics and Gynecology

## 2024-11-29 ENCOUNTER — Encounter (HOSPITAL_COMMUNITY): Admission: RE | Payer: Self-pay | Source: Home / Self Care

## 2024-11-29 ENCOUNTER — Encounter (HOSPITAL_COMMUNITY): Payer: Self-pay | Admitting: Certified Registered"

## 2024-11-29 DIAGNOSIS — I10 Essential (primary) hypertension: Secondary | ICD-10-CM

## 2024-12-22 ENCOUNTER — Ambulatory Visit (INDEPENDENT_AMBULATORY_CARE_PROVIDER_SITE_OTHER): Admitting: Family Medicine

## 2025-01-04 ENCOUNTER — Ambulatory Visit: Admitting: Pulmonary Disease

## 2025-07-12 ENCOUNTER — Ambulatory Visit: Admitting: Orthopaedic Surgery
